# Patient Record
Sex: Female | Born: 1975 | Race: Black or African American | Hispanic: No | Marital: Married | Smoking: Never smoker
Health system: Southern US, Community
[De-identification: ages and names within clinical notes are randomized; demographics above are authoritative.]

## PROBLEM LIST (undated history)

## (undated) DIAGNOSIS — M779 Enthesopathy, unspecified: Secondary | ICD-10-CM

## (undated) DIAGNOSIS — M1990 Unspecified osteoarthritis, unspecified site: Secondary | ICD-10-CM

## (undated) DIAGNOSIS — H04129 Dry eye syndrome of unspecified lacrimal gland: Secondary | ICD-10-CM

## (undated) DIAGNOSIS — Z9109 Other allergy status, other than to drugs and biological substances: Secondary | ICD-10-CM

## (undated) DIAGNOSIS — F410 Panic disorder [episodic paroxysmal anxiety] without agoraphobia: Secondary | ICD-10-CM

## (undated) DIAGNOSIS — K219 Gastro-esophageal reflux disease without esophagitis: Secondary | ICD-10-CM

## (undated) DIAGNOSIS — M719 Bursopathy, unspecified: Secondary | ICD-10-CM

## (undated) DIAGNOSIS — G43909 Migraine, unspecified, not intractable, without status migrainosus: Secondary | ICD-10-CM

## (undated) DIAGNOSIS — IMO0002 Reserved for concepts with insufficient information to code with codable children: Secondary | ICD-10-CM

## (undated) DIAGNOSIS — M329 Systemic lupus erythematosus, unspecified: Secondary | ICD-10-CM

## (undated) DIAGNOSIS — J45909 Unspecified asthma, uncomplicated: Secondary | ICD-10-CM

## (undated) DIAGNOSIS — Z889 Allergy status to unspecified drugs, medicaments and biological substances status: Secondary | ICD-10-CM

## (undated) DIAGNOSIS — K589 Irritable bowel syndrome without diarrhea: Secondary | ICD-10-CM

## (undated) HISTORY — DX: Unspecified osteoarthritis, unspecified site: M19.90

## (undated) HISTORY — PX: BREAST REDUCTION SURGERY: SHX8

## (undated) HISTORY — PX: REDUCTION MAMMAPLASTY: SUR839

## (undated) HISTORY — PX: TUBAL LIGATION: SHX77

---

## 1999-11-13 ENCOUNTER — Emergency Department (HOSPITAL_COMMUNITY): Admission: EM | Admit: 1999-11-13 | Discharge: 1999-11-13 | Payer: Self-pay

## 2002-03-28 ENCOUNTER — Ambulatory Visit (HOSPITAL_COMMUNITY): Admission: RE | Admit: 2002-03-28 | Discharge: 2002-03-28 | Payer: Self-pay | Admitting: Gastroenterology

## 2002-03-28 ENCOUNTER — Encounter: Payer: Self-pay | Admitting: Gastroenterology

## 2002-04-08 ENCOUNTER — Encounter: Payer: Self-pay | Admitting: Gastroenterology

## 2002-04-08 ENCOUNTER — Ambulatory Visit (HOSPITAL_COMMUNITY): Admission: RE | Admit: 2002-04-08 | Discharge: 2002-04-08 | Payer: Self-pay | Admitting: Gastroenterology

## 2002-04-17 ENCOUNTER — Ambulatory Visit (HOSPITAL_COMMUNITY): Admission: RE | Admit: 2002-04-17 | Discharge: 2002-04-17 | Payer: Self-pay | Admitting: Gastroenterology

## 2002-09-09 ENCOUNTER — Emergency Department (HOSPITAL_COMMUNITY): Admission: EM | Admit: 2002-09-09 | Discharge: 2002-09-09 | Payer: Self-pay | Admitting: Emergency Medicine

## 2002-09-09 ENCOUNTER — Encounter: Payer: Self-pay | Admitting: Emergency Medicine

## 2002-11-21 ENCOUNTER — Emergency Department (HOSPITAL_COMMUNITY): Admission: EM | Admit: 2002-11-21 | Discharge: 2002-11-21 | Payer: Self-pay | Admitting: Emergency Medicine

## 2003-05-21 ENCOUNTER — Emergency Department (HOSPITAL_COMMUNITY): Admission: EM | Admit: 2003-05-21 | Discharge: 2003-05-21 | Payer: Self-pay | Admitting: Emergency Medicine

## 2006-06-19 ENCOUNTER — Emergency Department (HOSPITAL_COMMUNITY): Admission: EM | Admit: 2006-06-19 | Discharge: 2006-06-20 | Payer: Self-pay | Admitting: Emergency Medicine

## 2006-06-20 ENCOUNTER — Ambulatory Visit (HOSPITAL_COMMUNITY): Admission: RE | Admit: 2006-06-20 | Discharge: 2006-06-20 | Payer: Self-pay | Admitting: Internal Medicine

## 2015-06-05 ENCOUNTER — Observation Stay (HOSPITAL_COMMUNITY)
Admission: EM | Admit: 2015-06-05 | Discharge: 2015-06-06 | Disposition: A | Discharge status: Home / Self Care | Payer: 59 | Attending: Internal Medicine | Admitting: Internal Medicine

## 2015-06-05 ENCOUNTER — Encounter (HOSPITAL_COMMUNITY): Payer: Self-pay | Admitting: Emergency Medicine

## 2015-06-05 ENCOUNTER — Emergency Department (HOSPITAL_COMMUNITY): Payer: 59

## 2015-06-05 DIAGNOSIS — J069 Acute upper respiratory infection, unspecified: Secondary | ICD-10-CM

## 2015-06-05 DIAGNOSIS — D72829 Elevated white blood cell count, unspecified: Secondary | ICD-10-CM | POA: Insufficient documentation

## 2015-06-05 DIAGNOSIS — R0689 Other abnormalities of breathing: Secondary | ICD-10-CM | POA: Diagnosis not present

## 2015-06-05 DIAGNOSIS — R0600 Dyspnea, unspecified: Secondary | ICD-10-CM | POA: Diagnosis not present

## 2015-06-05 DIAGNOSIS — D473 Essential (hemorrhagic) thrombocythemia: Secondary | ICD-10-CM | POA: Insufficient documentation

## 2015-06-05 DIAGNOSIS — J9601 Acute respiratory failure with hypoxia: Secondary | ICD-10-CM

## 2015-06-05 DIAGNOSIS — M1990 Unspecified osteoarthritis, unspecified site: Secondary | ICD-10-CM | POA: Diagnosis not present

## 2015-06-05 DIAGNOSIS — J309 Allergic rhinitis, unspecified: Secondary | ICD-10-CM | POA: Insufficient documentation

## 2015-06-05 DIAGNOSIS — K589 Irritable bowel syndrome without diarrhea: Secondary | ICD-10-CM | POA: Diagnosis not present

## 2015-06-05 DIAGNOSIS — D509 Iron deficiency anemia, unspecified: Secondary | ICD-10-CM | POA: Diagnosis not present

## 2015-06-05 DIAGNOSIS — H04129 Dry eye syndrome of unspecified lacrimal gland: Secondary | ICD-10-CM | POA: Insufficient documentation

## 2015-06-05 DIAGNOSIS — G43909 Migraine, unspecified, not intractable, without status migrainosus: Secondary | ICD-10-CM | POA: Diagnosis not present

## 2015-06-05 DIAGNOSIS — K219 Gastro-esophageal reflux disease without esophagitis: Secondary | ICD-10-CM | POA: Diagnosis not present

## 2015-06-05 DIAGNOSIS — Z791 Long term (current) use of non-steroidal anti-inflammatories (NSAID): Secondary | ICD-10-CM | POA: Diagnosis not present

## 2015-06-05 DIAGNOSIS — F410 Panic disorder [episodic paroxysmal anxiety] without agoraphobia: Secondary | ICD-10-CM

## 2015-06-05 DIAGNOSIS — J302 Other seasonal allergic rhinitis: Secondary | ICD-10-CM | POA: Diagnosis not present

## 2015-06-05 DIAGNOSIS — Z3202 Encounter for pregnancy test, result negative: Secondary | ICD-10-CM | POA: Insufficient documentation

## 2015-06-05 DIAGNOSIS — R112 Nausea with vomiting, unspecified: Secondary | ICD-10-CM

## 2015-06-05 DIAGNOSIS — J45909 Unspecified asthma, uncomplicated: Secondary | ICD-10-CM | POA: Diagnosis not present

## 2015-06-05 DIAGNOSIS — F419 Anxiety disorder, unspecified: Secondary | ICD-10-CM

## 2015-06-05 DIAGNOSIS — T380X5A Adverse effect of glucocorticoids and synthetic analogues, initial encounter: Secondary | ICD-10-CM | POA: Diagnosis not present

## 2015-06-05 DIAGNOSIS — J4521 Mild intermittent asthma with (acute) exacerbation: Secondary | ICD-10-CM

## 2015-06-05 DIAGNOSIS — J3089 Other allergic rhinitis: Secondary | ICD-10-CM

## 2015-06-05 HISTORY — DX: Irritable bowel syndrome, unspecified: K58.9

## 2015-06-05 HISTORY — DX: Panic disorder (episodic paroxysmal anxiety): F41.0

## 2015-06-05 HISTORY — DX: Gastro-esophageal reflux disease without esophagitis: K21.9

## 2015-06-05 HISTORY — DX: Dry eye syndrome of unspecified lacrimal gland: H04.129

## 2015-06-05 HISTORY — DX: Other allergy status, other than to drugs and biological substances: Z91.09

## 2015-06-05 HISTORY — DX: Enthesopathy, unspecified: M77.9

## 2015-06-05 HISTORY — DX: Bursopathy, unspecified: M71.9

## 2015-06-05 HISTORY — DX: Migraine, unspecified, not intractable, without status migrainosus: G43.909

## 2015-06-05 HISTORY — DX: Allergy status to unspecified drugs, medicaments and biological substances: Z88.9

## 2015-06-05 LAB — COMPREHENSIVE METABOLIC PANEL
ALBUMIN: 3.9 g/dL (ref 3.5–5.0)
ALK PHOS: 59 U/L (ref 38–126)
ALT: 14 U/L (ref 14–54)
ANION GAP: 9 (ref 5–15)
AST: 23 U/L (ref 15–41)
BILIRUBIN TOTAL: 0.7 mg/dL (ref 0.3–1.2)
BUN: 7 mg/dL (ref 6–20)
CALCIUM: 9.2 mg/dL (ref 8.9–10.3)
CO2: 21 mmol/L — AB (ref 22–32)
CREATININE: 0.82 mg/dL (ref 0.44–1.00)
Chloride: 107 mmol/L (ref 101–111)
GFR calc Af Amer: >60 mL/min (ref >60)
GFR calc non Af Amer: >60 mL/min (ref >60)
GLUCOSE: 105 mg/dL — AB (ref 65–99)
Potassium: 3.4 mmol/L — ABNORMAL LOW (ref 3.5–5.1)
SODIUM: 137 mmol/L (ref 135–145)
TOTAL PROTEIN: 7.3 g/dL (ref 6.5–8.1)

## 2015-06-05 LAB — CBC WITH DIFFERENTIAL/PLATELET
BASOS PCT: 0 % (ref 0–1)
Basophils Absolute: 0 10*3/uL (ref 0.0–0.1)
Eosinophils Absolute: 0.4 10*3/uL (ref 0.0–0.7)
Eosinophils Relative: 3 % (ref 0–5)
HEMATOCRIT: 37.6 % (ref 36.0–46.0)
HEMOGLOBIN: 12.5 g/dL (ref 12.0–15.0)
LYMPHS ABS: 1.9 10*3/uL (ref 0.7–4.0)
Lymphocytes Relative: 18 % (ref 12–46)
MCH: 28.2 pg (ref 26.0–34.0)
MCHC: 33.2 g/dL (ref 30.0–36.0)
MCV: 84.7 fL (ref 78.0–100.0)
MONOS PCT: 6 % (ref 3–12)
Monocytes Absolute: 0.6 10*3/uL (ref 0.1–1.0)
NEUTROS ABS: 7.4 10*3/uL (ref 1.7–7.7)
NEUTROS PCT: 73 % (ref 43–77)
Platelets: 430 10*3/uL — ABNORMAL HIGH (ref 150–400)
RBC: 4.44 MIL/uL (ref 3.87–5.11)
RDW: 12.7 % (ref 11.5–15.5)
WBC: 10.2 10*3/uL (ref 4.0–10.5)

## 2015-06-05 LAB — TROPONIN I: Troponin I: <0.03 ng/mL (ref <0.031)

## 2015-06-05 LAB — POC URINE PREG, ED: Preg Test, Ur: NEGATIVE

## 2015-06-05 LAB — I-STAT TROPONIN, ED
TROPONIN I, POC: 0 ng/mL (ref 0.00–0.08)
Troponin i, poc: 0 ng/mL (ref 0.00–0.08)

## 2015-06-05 MED ORDER — ACETAMINOPHEN 325 MG PO TABS: 650.0000 mg | ORAL_TABLET | Freq: Four times a day (QID) | ORAL | Status: DC | PRN

## 2015-06-05 MED ORDER — PREDNISONE 20 MG PO TABS
60.0000 mg | ORAL_TABLET | Freq: Once | ORAL | Status: AC
  Administered 2015-06-05: 60 mg via ORAL
  Filled 2015-06-05: qty 3

## 2015-06-05 MED ORDER — IPRATROPIUM BROMIDE 0.02 % IN SOLN
0.5000 mg | Freq: Four times a day (QID) | RESPIRATORY_TRACT | Status: DC
  Administered 2015-06-05 – 2015-06-06 (×3): 0.5 mg via RESPIRATORY_TRACT
  Filled 2015-06-05 (×3): qty 2.5

## 2015-06-05 MED ORDER — PANTOPRAZOLE SODIUM 40 MG PO TBEC
40.0000 mg | DELAYED_RELEASE_TABLET | Freq: Two times a day (BID) | ORAL | Status: DC
  Administered 2015-06-06 (×2): 40 mg via ORAL
  Filled 2015-06-05 (×2): qty 1

## 2015-06-05 MED ORDER — LORAZEPAM 2 MG/ML IJ SOLN
1.0000 mg | Freq: Once | INTRAMUSCULAR | Status: AC
  Administered 2015-06-05: 1 mg via INTRAVENOUS
  Filled 2015-06-05: qty 1

## 2015-06-05 MED ORDER — DIAZEPAM 5 MG/ML IJ SOLN
5.0000 mg | Freq: Once | INTRAMUSCULAR | Status: AC
  Administered 2015-06-05: 5 mg via INTRAVENOUS
  Filled 2015-06-05: qty 2

## 2015-06-05 MED ORDER — ONDANSETRON HCL 4 MG PO TABS: 4.0000 mg | ORAL_TABLET | Freq: Four times a day (QID) | ORAL | Status: DC | PRN

## 2015-06-05 MED ORDER — SODIUM CHLORIDE 0.9 % IJ SOLN
3.0000 mL | Freq: Two times a day (BID) | INTRAMUSCULAR | Status: DC
  Administered 2015-06-05 – 2015-06-06 (×2): 3 mL via INTRAVENOUS

## 2015-06-05 MED ORDER — METHYLPREDNISOLONE SODIUM SUCC 40 MG IJ SOLR
40.0000 mg | Freq: Two times a day (BID) | INTRAMUSCULAR | Status: DC
  Administered 2015-06-05 – 2015-06-06 (×2): 40 mg via INTRAVENOUS
  Filled 2015-06-05 (×2): qty 1

## 2015-06-05 MED ORDER — LIDOCAINE HCL (PF) 2 % IJ SOLN: 5.0000 mL | Freq: Once | INTRAMUSCULAR | Status: DC

## 2015-06-05 MED ORDER — FLUTICASONE PROPIONATE 50 MCG/ACT NA SUSP
1.0000 | Freq: Every day | NASAL | Status: DC
  Administered 2015-06-05 – 2015-06-06 (×2): 1 via NASAL
  Filled 2015-06-05: qty 16

## 2015-06-05 MED ORDER — IPRATROPIUM-ALBUTEROL 0.5-2.5 (3) MG/3ML IN SOLN
3.0000 mL | Freq: Once | RESPIRATORY_TRACT | Status: AC
  Administered 2015-06-05: 3 mL via RESPIRATORY_TRACT
  Filled 2015-06-05: qty 3

## 2015-06-05 MED ORDER — ENOXAPARIN SODIUM 40 MG/0.4ML ~~LOC~~ SOLN
40.0000 mg | SUBCUTANEOUS | Status: DC
  Administered 2015-06-05: 40 mg via SUBCUTANEOUS
  Filled 2015-06-05: qty 0.4

## 2015-06-05 MED ORDER — ONDANSETRON HCL 4 MG/2ML IJ SOLN: 4.0000 mg | Freq: Four times a day (QID) | INTRAMUSCULAR | Status: DC | PRN

## 2015-06-05 MED ORDER — MAGNESIUM SULFATE 2 GM/50ML IV SOLN
2.0000 g | Freq: Once | INTRAVENOUS | Status: AC
  Administered 2015-06-05: 2 g via INTRAVENOUS
  Filled 2015-06-05: qty 50

## 2015-06-05 MED ORDER — DIAZEPAM 5 MG PO TABS: 5.0000 mg | ORAL_TABLET | Freq: Four times a day (QID) | ORAL | Status: DC | PRN

## 2015-06-05 MED ORDER — LIDOCAINE HCL (PF) 2 % IJ SOLN
2.0000 mL | Freq: Once | INTRAMUSCULAR | Status: DC
  Filled 2015-06-05: qty 2

## 2015-06-05 MED ORDER — ALBUTEROL SULFATE (2.5 MG/3ML) 0.083% IN NEBU
2.5000 mg | INHALATION_SOLUTION | RESPIRATORY_TRACT | Status: AC
  Administered 2015-06-05: 2.5 mg via RESPIRATORY_TRACT
  Filled 2015-06-05: qty 3

## 2015-06-05 MED ORDER — IBUPROFEN 400 MG PO TABS: 800.0000 mg | ORAL_TABLET | Freq: Four times a day (QID) | ORAL | Status: DC | PRN

## 2015-06-05 MED ORDER — ACETAMINOPHEN 650 MG RE SUPP: 650.0000 mg | Freq: Four times a day (QID) | RECTAL | Status: DC | PRN

## 2015-06-05 MED ORDER — ALBUTEROL SULFATE (2.5 MG/3ML) 0.083% IN NEBU: 2.5000 mg | INHALATION_SOLUTION | RESPIRATORY_TRACT | Status: DC | PRN

## 2015-06-05 MED ORDER — MONTELUKAST SODIUM 10 MG PO TABS
10.0000 mg | ORAL_TABLET | Freq: Every day | ORAL | Status: DC
  Administered 2015-06-05: 10 mg via ORAL
  Filled 2015-06-05: qty 1

## 2015-06-05 NOTE — ED Notes (Signed)
Vital signs stable. 

## 2015-06-05 NOTE — ED Notes (Signed)
Family at bedside. 

## 2015-06-05 NOTE — Consult Note (Signed)
Reason for Consult: Shortness of breath Referring Physician: Emergency room  Jasmine Schultz is an 39 y.o. female.  HPI: History of shortness of breath and possible stridor. She was seen in urgent care and had respiratory distress there and was treated with multiple products including epinephrine and steroid. She was sent to the emergency room. Once at the emergency room she had a soft tissue neck which was normal and had no specific reason for her shortness of breath. She was noted to have expiratory wheezing. She has not had this problem previously. She does have a history of reflux problems. She has not ingested anything. No foreign body issues that she is aware of. I was asked to see her to make sure her upper airway is without evidence of any angioedema.  History reviewed. No pertinent past medical history.  History reviewed. No pertinent past surgical history.  History reviewed. No pertinent family history.  Social History:  reports that she has never smoked. She does not have any smokeless tobacco history on file. She reports that she does not drink alcohol or use illicit drugs.  Allergies:  Allergies  Allergen Reactions  . Tomato Anaphylaxis and Rash  . Oysters [Shellfish Allergy] Hives    Medications: I have reviewed the patient's current medications.  Results for orders placed or performed during the hospital encounter of 06/05/15 (from the past 48 hour(s))  CBC with Differential     Status: Abnormal   Collection Time: 06/05/15 12:37 PM  Result Value Ref Range   WBC 10.2 4.0 - 10.5 K/uL   RBC 4.44 3.87 - 5.11 MIL/uL   Hemoglobin 12.5 12.0 - 15.0 g/dL   HCT 37.6 36.0 - 46.0 %   MCV 84.7 78.0 - 100.0 fL   MCH 28.2 26.0 - 34.0 pg   MCHC 33.2 30.0 - 36.0 g/dL   RDW 12.7 11.5 - 15.5 %   Platelets 430 (H) 150 - 400 K/uL   Neutrophils Relative % 73 43 - 77 %   Neutro Abs 7.4 1.7 - 7.7 K/uL   Lymphocytes Relative 18 12 - 46 %   Lymphs Abs 1.9 0.7 - 4.0 K/uL   Monocytes  Relative 6 3 - 12 %   Monocytes Absolute 0.6 0.1 - 1.0 K/uL   Eosinophils Relative 3 0 - 5 %   Eosinophils Absolute 0.4 0.0 - 0.7 K/uL   Basophils Relative 0 0 - 1 %   Basophils Absolute 0.0 0.0 - 0.1 K/uL  Comprehensive metabolic panel     Status: Abnormal   Collection Time: 06/05/15 12:37 PM  Result Value Ref Range   Sodium 137 135 - 145 mmol/L   Potassium 3.4 (L) 3.5 - 5.1 mmol/L   Chloride 107 101 - 111 mmol/L   CO2 21 (L) 22 - 32 mmol/L   Glucose, Bld 105 (H) 65 - 99 mg/dL   BUN 7 6 - 20 mg/dL   Creatinine, Ser 0.82 0.44 - 1.00 mg/dL   Calcium 9.2 8.9 - 10.3 mg/dL   Total Protein 7.3 6.5 - 8.1 g/dL   Albumin 3.9 3.5 - 5.0 g/dL   AST 23 15 - 41 U/L   ALT 14 14 - 54 U/L   Alkaline Phosphatase 59 38 - 126 U/L   Total Bilirubin 0.7 0.3 - 1.2 mg/dL   GFR calc non Af Amer >60 >60 mL/min   GFR calc Af Amer >60 >60 mL/min    Comment: (NOTE) The eGFR has been calculated using the CKD EPI  equation. This calculation has not been validated in all clinical situations. eGFR's persistently <60 mL/min signify possible Chronic Kidney Disease.    Anion gap 9 5 - 15  I-Stat Troponin, ED - 0, 3, 6 hours (not at [facility])     Status: None   Collection Time: 06/05/15 12:37 PM  Result Value Ref Range   Troponin i, poc 0.00 0.00 - 0.08 ng/mL   Comment 3            Comment: Due to the release kinetics of cTnI, a negative result within the first hours of the onset of symptoms does not rule out myocardial infarction with certainty. If myocardial infarction is still suspected, repeat the test at appropriate intervals.   I-Stat Troponin, ED - 0, 3, 6 hours (not at [facility])     Status: None   Collection Time: 06/05/15  3:37 PM  Result Value Ref Range   Troponin i, poc 0.00 0.00 - 0.08 ng/mL   Comment 3            Comment: Due to the release kinetics of cTnI, a negative result within the first hours of the onset of symptoms does not rule out myocardial infarction with certainty. If myocardial  infarction is still suspected, repeat the test at appropriate intervals.     Dg Neck Soft Tissue  06/05/2015   CLINICAL DATA:  39 year old female with acute respiratory distress and cough. Feeling of airway closing.  EXAM: NECK SOFT TISSUES - 1+ VIEW  COMPARISON:  None.  FINDINGS: There is no evidence of retropharyngeal soft tissue swelling or epiglottic enlargement. The adenoidal tissue is slightly prominent. The cervical airway is unremarkable and no unexpected radio-opaque foreign body identified.  IMPRESSION: Slightly prominent adenoids otherwise unremarkable exam.   Electronically Signed   By: Margarette Canada M.D.   On: 06/05/2015 12:56   Dg Chest Portable 1 View  06/05/2015   CLINICAL DATA:  Respiratory distress at the doctor's office, continuous coughing  EXAM: PORTABLE CHEST - 1 VIEW  COMPARISON:  None.  FINDINGS: Study is hypoinspiratory with low lung volumes, with resultant crowding of the perihilar and bibasilar bronchovascular markings. Given the low lung volumes, cardiomediastinal silhouette is within normal limits in size/configuration and lungs are clear. No confluent opacity to suggest a developing pneumonia. No pleural effusions seen. No pneumothorax. No osseous abnormality.  IMPRESSION: Hypoinspiratory exam with low lung volumes.  No evidence of acute cardiopulmonary abnormality.   Electronically Signed   By: Franki Cabot M.D.   On: 06/05/2015 12:53    ROS Blood pressure 129/82, pulse 95, temperature 99.2 F (37.3 C), temperature source Oral, resp. rate 18, SpO2 100 %. Physical Exam  Constitutional: She appears well-developed and well-nourished.  HENT:  Head: Normocephalic and atraumatic.  She seems uncomfortable. She has a intermittent expiratory stridor wheezing sound. Her voice sounds normal. She does seem short of breath. Fiberoptic exam reveals the nasopharynx, pharynx, hypopharynx are all without evidence of any significant swelling. The vocal cords are slightly thickened and  there is a pseudo-sulcus. This is consistent with reflux. There seems to be good airway with open vocal cord sputum while she was having the expiratory wheezing meaning its down in her bronchus or lungs. Old focal cords appeared to move normally. She was straining and did tense the vocal cords multiple times while the exam was being performed but they never closed completely except one instructed to do so.  Neck: Normal range of motion. Neck supple.    Assessment/Plan:  Shortness of breath-fiberoptic exam does not reveal any evidence of upper airway swelling. It is possible that she was having some laryngospasm problems which would most likely be related to reflux. Currently there is no explanation for her expiratory wheezing that she was having while observing the vocal cords. It would appear that there is some inflammation or problem in the bronchus or lungs. I would recommend a critical care consult with pulmonology. Reflux treatment regardless should be instituted with twice a day proton pump inhibitor  Melissa Montane 06/05/2015, 4:04 PM

## 2015-06-05 NOTE — ED Notes (Signed)
ENT at bedside

## 2015-06-05 NOTE — ED Notes (Signed)
Report called to 5W RN

## 2015-06-05 NOTE — ED Notes (Signed)
NRB  Removed from pt , sats remain 100 %

## 2015-06-05 NOTE — H&P (Signed)
[facility] History and Physical  Jasmine Schultz ZOX:[id number] DOB: 20-Dec-1975 DOA: 06/05/2015  Referring physician:  Gareth Morgan PCP:  Maximino Greenland, MD   Chief Complaint:  SOB  HPI:  The patient is a 39 y.o. year-old female with history of sun allergy, allergic rhinitis, severe contact dermatitis to numerous products, bursitis, tendinitis, migraine headaches, IBS, GERD, and chronic dry eye, anxiety attacks who presents with shortness of breath.  The patient was last at their baseline health until yesterday. She developed some mild sore throat and nausea and had some vomiting. After she vomited, she felt like her throat was closing a little bit, but it got better.  She vomited several additional times this morning, after which she felt very Lukka Black of breath and like her throat was closing up. She developed wheezing, sensation of tongue swelling, difficulty swallowing.  + orthopnea and ankle edema. She denied chest pains initially, but then endorsed substernal pressure. She also had lightheadedness, shooting pains across the front of her chest that went down to both of her hands with tingling in her fingers.  She denied fevers, chills, diarrhea, dysuria. She denied any new food exposures, or contact with any of her known allergens. She denied hives, itching, skin rash of any kind.  She presented to urgent care where she was in respiratory distress and given epinephrine, Benadryl, Decadron, Atrovent, albuterol. EMS was called and she was placed on CPAP and was transported over to the emergency department. In the emergency department, her oxygen saturations were 99-100% on room air. She had x-ray soft tissue neck which demonstrated no evidence of epiglottitis but slightly prominent adenoids.  Chest x-ray demonstrated hypoventilatory exam with low lung volumes but no evidence of acute cardiopulmonary abnormality.  ENT was consulted and they performed bedside fiberoptic exam which revealed no  evidence of upper airway swelling. He suggested that she was having laryngospasm secondary to reflux or recent vomiting. They found no explanation for her expiratory wheezing and suggested she had a problem of the bronchi or lungs and critical care pulmonology consult. Pulmonology was consulted and recommended ABG and treatment for presumptive asthma. She is being admitted for further treatment. Of note, when the respiratory therapist came to perform her ABG she suddenly became extremely tearful and anxious and had shaking that prevented even an attempt at obtaining a blood draw. She had previously been given Ativan 1 mg IV which made no improvement in her symptoms and she was given duo nab and 1 dose of Valium in the emergency department. She also started prednisone.  Review of Systems:  General:  Denies fevers, chills, weight loss or gain HEENT:  Denies changes to hearing and vision, rhinorrhea, sinus congestion, positive sore throat CV:  Positive chest pain and denies palpitations, positive lower extremity edema.  PULM:  Positive SOB, wheezing, cough.   GI:  Positive nausea, nonbilious, nonbloody vomiting, constipation, diarrhea.   GU:  Denies dysuria, frequency, urgency ENDO:  Denies polyuria, polydipsia.   HEME:  Denies hematemesis, blood in stools, melena, abnormal bruising or bleeding.  LYMPH:  Denies lymphadenopathy.   MSK:  Denies arthralgias, myalgias.   DERM:  Denies skin rash or ulcer.   NEURO:  Denies focal numbness, weakness, slurred speech, confusion, facial droop.  PSYCH:  Denies anxiety and depression.    Past Medical History  Diagnosis Date  . Multiple allergies   . Sun allergy   . Bursitis   . Tendonitis   . Migraine   . IBS (irritable bowel syndrome)   .  GERD (gastroesophageal reflux disease)   . Chronically dry eyes   . Anxiety attack    Past Surgical History  Procedure Laterality Date  . Tubal ligation    . Breast reduction surgery     Social History:  reports  that she has never smoked. She does not have any smokeless tobacco history on file. She reports that she drinks alcohol. She reports that she does not use illicit drugs.  Allergies  Allergen Reactions  . Tomato Anaphylaxis and Rash  . Oysters [Shellfish Allergy] Hives    Family History  Problem Relation Age of Onset  . Asthma    . CAD    . CVA    . Aneurysm      brain aneurysm multiple family members     Prior to Admission medications   Medication Sig Start Date End Date Taking? Authorizing Provider  Bilberry, Vaccinium myrtillus, (BILBERRY PO) Take 1 tablet by mouth daily.   Yes Historical Provider, MD  BIOTIN PO Take 1 tablet by mouth daily.   Yes Historical Provider, MD  Cholecalciferol (VITAMIN D-3 PO) Take 1 capsule by mouth daily.   Yes Historical Provider, MD  CINNAMON PO Take 1 tablet by mouth daily.   Yes Historical Provider, MD  ECHINACEA PO Take 1 tablet by mouth daily.   Yes Historical Provider, MD  EVENING PRIMROSE OIL PO Take 1 tablet by mouth daily.   Yes Historical Provider, MD  ferrous sulfate 325 (65 FE) MG tablet Take 325 mg by mouth daily with breakfast.   Yes Historical Provider, MD  folic acid (FOLVITE) 1 MG tablet Take 1 mg by mouth daily.   Yes Historical Provider, MD  milk thistle 175 MG tablet Take 175 mg by mouth daily.   Yes Historical Provider, MD  Omega-3 Fatty Acids (FISH OIL PO) Take 1 capsule by mouth daily.   Yes Historical Provider, MD  Prenatal Vit-Fe Fumarate-FA (PRENATAL MULTIVITAMIN) TABS tablet Take 1 tablet by mouth daily at 12 noon.   Yes Historical Provider, MD  Red Yeast Rice Extract (RED YEAST RICE PO) Take 1 tablet by mouth daily.   Yes Historical Provider, MD  selenium 50 MCG TABS tablet Take 50 mcg by mouth daily.   Yes Historical Provider, MD   Physical Exam: Filed Vitals:   06/05/15 1530 06/05/15 1600 06/05/15 1630 06/05/15 1715  BP: 129/82 137/87 115/67 108/70  Pulse: 95 89 94 95  Temp:      TempSrc:      Resp: 18 16 33 19   SpO2: 100% 100% 100% 100%     General:  Obese female, tachypneic but in NAD  Eyes:  PERRL, anicteric, non-injected.  ENT:  Nares clear.  OP clear, mild erythema of the tonsils with cobblestoning, no exudates or plaques.  MMM.  Neck:  Supple without TM or JVD.    Lymph:  No cervical, supraclavicular, or submandibular LAD.  Cardiovascular:  RRR, normal S1, S2, without m/r/g.  2+ pulses, warm extremities  Respiratory:  Full expiratory wheeze throughout without focal rales, rhonchi, no stridor  Abdomen:  NABS.  Soft, ND/NT.    Skin:  No rashes or focal lesions.  Musculoskeletal:  Normal bulk and tone.  Trace nonpitting bilateral LE edema.  Psychiatric:  A & O x 4.  Anxious affect.  Neurologic:  CN 3-12 intact.  5/5 strength.  Sensation intact.  Labs on Admission:  Basic Metabolic Panel:  Recent Labs Lab 06/05/15 1237  NA 137  K 3.4*  CL  107  CO2 21*  GLUCOSE 105*  BUN 7  CREATININE 0.82  CALCIUM 9.2   Liver Function Tests:  Recent Labs Lab 06/05/15 1237  AST 23  ALT 14  ALKPHOS 59  BILITOT 0.7  PROT 7.3  ALBUMIN 3.9   No results for input(s): LIPASE, AMYLASE in the last 168 hours. No results for input(s): AMMONIA in the last 168 hours. CBC:  Recent Labs Lab 06/05/15 1237  WBC 10.2  NEUTROABS 7.4  HGB 12.5  HCT 37.6  MCV 84.7  PLT 430*   Cardiac Enzymes: No results for input(s): CKTOTAL, CKMB, CKMBINDEX, TROPONINI in the last 168 hours.  BNP (last 3 results) No results for input(s): BNP in the last 8760 hours.  ProBNP (last 3 results) No results for input(s): PROBNP in the last 8760 hours.  CBG: No results for input(s): GLUCAP in the last 168 hours.  Radiological Exams on Admission: Dg Neck Soft Tissue  06/05/2015   CLINICAL DATA:  39 year old female with acute respiratory distress and cough. Feeling of airway closing.  EXAM: NECK SOFT TISSUES - 1+ VIEW  COMPARISON:  None.  FINDINGS: There is no evidence of retropharyngeal soft tissue  swelling or epiglottic enlargement. The adenoidal tissue is slightly prominent. The cervical airway is unremarkable and no unexpected radio-opaque foreign body identified.  IMPRESSION: Slightly prominent adenoids otherwise unremarkable exam.   Electronically Signed   By: Margarette Canada M.D.   On: 06/05/2015 12:56   Dg Chest Portable 1 View  06/05/2015   CLINICAL DATA:  Respiratory distress at the doctor's office, continuous coughing  EXAM: PORTABLE CHEST - 1 VIEW  COMPARISON:  None.  FINDINGS: Study is hypoinspiratory with low lung volumes, with resultant crowding of the perihilar and bibasilar bronchovascular markings. Given the low lung volumes, cardiomediastinal silhouette is within normal limits in size/configuration and lungs are clear. No confluent opacity to suggest a developing pneumonia. No pleural effusions seen. No pneumothorax. No osseous abnormality.  IMPRESSION: Hypoinspiratory exam with low lung volumes.  No evidence of acute cardiopulmonary abnormality.   Electronically Signed   By: Franki Cabot M.D.   On: 06/05/2015 12:53    EKG: Independently reviewed. Normal sinus rhythm, no previous EKG for comparison but has flattened T waves in the lateral leads.  Assessment/Plan Active Problems:   Reactive airway disease   Dyspnea   Nausea and vomiting   Anxiety attack   Allergic rhinitis  ---  Dyspnea without acute hypoxic respiratory failure, likely secondary to reactive airway disease from exposure from an allergen in the setting of underlying anxiety.  Alternatively, she may have some bronchospasm from an aspiration event from her vomiting.  Chest pain improving. -  ABG not able to be obtained -  Respiratory viral panel -  Telemetry -  Cycle one more troponin -  Agree with steroids -  Nebulizer treatments per pulmonology -  Agree with Singulair and magnesium -  Protonix  Allergic rhinitis, chronic, stable, continue Flonase  Anxiety with panic attacks -  Consider starting  SSRI -  Will continue valium prn  Nausea and vomiting likely due to viral illness.  No abdominal pain to suggest pancreatitis -  LFTs wnl -  zofran prn -  Pregnancy test negative  Migraine headache, currently HA free.  Ibuprofen prn headache  IBS, stable  Takes multiple supplements for her health:  Bilberry, biotin, cinnamon, echinacea, evening primrose, milk thistle, omega-3, red yeast rice extract, selenium, and PNV (had tubal ligation many years ago)  Diet:  regular Access:  PIV IVF:  off Proph:  lovenox  Code Status: full Family Communication: patient, her mother and bo Disposition Plan: Admit to telemetry  Time spent: 60 min Janece Canterbury [facility] Pager [phone]  If 7PM-7AM, please contact night-coverage [email or web address] Password [facility] 06/05/2015, 6:27 PM

## 2015-06-05 NOTE — ED Notes (Signed)
Patient is resting comfortably. 

## 2015-06-05 NOTE — ED Notes (Signed)
Pt able to walk but continues to state that she feels  sob but sats remain 100 % , pt resp rate does increase

## 2015-06-05 NOTE — Progress Notes (Signed)
Pt arrived in room. Pt states no complaints. A&Ox4. Family at bedside. Telemetry applied.

## 2015-06-05 NOTE — Consult Note (Addendum)
Name: Jasmine Schultz MRN: [id number] DOB: 01-06-1976    ADMISSION DATE:  06/05/2015 CONSULTATION DATE:  06/05/15  REFERRING MD :  ED  CHIEF COMPLAINT:  Shortness of breath  BRIEF PATIENT DESCRIPTION: Jasmine Schultz is a 39 year old with history of allergic rhinitis, postnasal drip, seasonal allergies, GERD.   SIGNIFICANT EVENTS   STUDIES:  CXR (06/05/15) No acute abnormality  Neck X ray (06/05/15) Slightly prominent adenoids otherwise unremarkable exam.  HISTORY OF PRESENT ILLNESS:   Presents with 1 day of sore throat and cough associated with wheezing and dyspnea. She went to urgent care where she was given Decadron 10 mg IM. She was also given epi, albuterol and Atrovent nebs and sent to ED. She reports that she felt her throat closing after she was given the nebulizers. She was put on CPAP briefly. Noted to have stridor and upper eyelids and diffuse bilateral wheezes. She was seen by ENT for a question of vocal cord dysfunction. Their examination revealed no evidence of VCD, airway edema or obstruction. 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   PAST MEDICAL HISTORY :  GERD Seasonal allergies Allergic rhinitis Postnasal drip Bursitis IBS Arthritis Migraines   Prior to Admission medications   Medication Sig Start Date End Date Taking? Authorizing Jasmine Schultz  Bilberry, Vaccinium myrtillus, (BILBERRY PO) Take 1 tablet by mouth daily.   Yes Historical Jasmine Bakke, MD  BIOTIN PO Take 1 tablet by mouth daily.   Yes Historical Jasmine Mizzell, MD  Cholecalciferol (VITAMIN D-3 PO) Take 1 capsule by mouth daily.   Yes Historical Jasmine Hirota, MD  CINNAMON PO Take 1 tablet by mouth daily.   Yes Historical Jasmine Warshawsky, MD  ECHINACEA PO Take 1 tablet by mouth daily.   Yes Historical Jasmine Kopke, MD  EVENING PRIMROSE OIL PO Take 1 tablet by mouth daily.   Yes Historical Jasmine Uplinger, MD  ferrous sulfate 325 (65 FE) MG tablet Take 325 mg by mouth daily with breakfast.   Yes Historical Jasmine Dungee, MD    folic acid (FOLVITE) 1 MG tablet Take 1 mg by mouth daily.   Yes Historical Jasmine Nanez, MD  milk thistle 175 MG tablet Take 175 mg by mouth daily.   Yes Historical Jasmine Buch, MD  Omega-3 Fatty Acids (FISH OIL PO) Take 1 capsule by mouth daily.   Yes Historical Jasmine Gatchalian, MD  Prenatal Vit-Fe Fumarate-FA (PRENATAL MULTIVITAMIN) TABS tablet Take 1 tablet by mouth daily at 12 noon.   Yes Historical Jasmine Oliveri, MD  Red Yeast Rice Extract (RED YEAST RICE PO) Take 1 tablet by mouth daily.   Yes Historical Jasmine Zani, MD  selenium 50 MCG TABS tablet Take 50 mcg by mouth daily.   Yes Historical Jasmine Mangino, MD   Allergies  Allergen Reactions  . Tomato Anaphylaxis and Rash  . Oysters [employer] Allergy] Hives    FAMILY HISTORY:  family history is not on file. SOCIAL HISTORY:  reports that she has never smoked. She does not have any smokeless tobacco history on file. She reports that she does not drink alcohol or use illicit drugs.  REVIEW OF SYSTEMS:   Constitutional: Negative for fever, chills, weight loss, malaise/fatigue and diaphoresis.  HENT: Negative for hearing loss, ear pain, nosebleeds, congestion, sore throat, neck pain, tinnitus and ear discharge.   Eyes: Negative for blurred vision, double vision, photophobia, pain, discharge and redness.  Respiratory: Positive for cough, shortness of breath, wheezing and stridor. Negative for hemoptysis, sputum production,    Cardiovascular: Negative for chest pain, palpitations, orthopnea, claudication, leg swelling and PND.  Gastrointestinal: Negative for heartburn, nausea, vomiting, abdominal pain, diarrhea, constipation, blood in stool and melena.  Genitourinary: Negative for dysuria, urgency, frequency, hematuria and flank pain.  Musculoskeletal: Negative for myalgias, back pain, joint pain and falls.  Skin: Negative for itching and rash.  Neurological: Negative for dizziness, tingling, tremors, sensory change, speech change, focal weakness, seizures, loss  of consciousness, weakness and headaches.  All other ROS are negative.  SUBJECTIVE:   VITAL SIGNS: Temp:  [99.2 F (37.3 C)] 99.2 F (37.3 C) (08/26 1207) Pulse Rate:  [87-107] 95 (08/26 1530) Resp:  [10-22] 18 (08/26 1530) BP: (105-138)/(58-98) 129/82 mmHg (08/26 1530) SpO2:  [99 %-100 %] 100 % (08/26 1530)  PHYSICAL EXAMINATION: General: Mild distress, Awake, oriented Neuro:  Intact, no gross focal deficits HEENT:  PERRLA, moist mucus membranes, no stridor Cardiovascular:  RRR, no MRG Lungs: Diffuse b/l exp wheeze. Abdomen:  Soft, + BS Skin: Intact   Recent Labs Lab 06/05/15 1237  NA 137  K 3.4*  CL 107  CO2 21*  BUN 7  CREATININE 0.82  GLUCOSE 105*    Recent Labs Lab 06/05/15 1237  HGB 12.5  HCT 37.6  WBC 10.2  PLT 430*   Dg Neck Soft Tissue  06/05/2015   CLINICAL DATA:  39 year old female with acute respiratory distress and cough. Feeling of airway closing.  EXAM: NECK SOFT TISSUES - 1+ VIEW  COMPARISON:  None.  FINDINGS: There is no evidence of retropharyngeal soft tissue swelling or epiglottic enlargement. The adenoidal tissue is slightly prominent. The cervical airway is unremarkable and no unexpected radio-opaque foreign body identified.  IMPRESSION: Slightly prominent adenoids otherwise unremarkable exam.   Electronically Signed   By: Margarette Canada M.D.   On: 06/05/2015 12:56   Dg Chest Portable 1 View  06/05/2015   CLINICAL DATA:  Respiratory distress at the doctor's office, continuous coughing  EXAM: PORTABLE CHEST - 1 VIEW  COMPARISON:  None.  FINDINGS: Study is hypoinspiratory with low lung volumes, with resultant crowding of the perihilar and bibasilar bronchovascular markings. Given the low lung volumes, cardiomediastinal silhouette is within normal limits in size/configuration and lungs are clear. No confluent opacity to suggest a developing pneumonia. No pleural effusions seen. No pneumothorax. No osseous abnormality.  IMPRESSION: Hypoinspiratory exam  with low lung volumes.  No evidence of acute cardiopulmonary abnormality.   Electronically Signed   By: Franki Cabot M.D.   On: 06/05/2015 12:53    ASSESSMENT / PLAN: Mrs. Mchaney likely has reactive airway disease/asthma. Her issues with allergic rhinitis, sinusitis, postnasal drip and GERD are likely contributing to her problems. The etiology of the stridor is not entirely clear. There is no evidence of vocal cord dysfunction or airway obstruction by ENT examination. She could be having intermittent vocal cord spasms from laryngeal pharyngeal reflux. I suspect that anxiety is a component as well.  She appears comfortable on room air with sats of 100%, RR of 20. No accessory muscle use. We were asked about the need for bronchoscopy. I do not believe this is indicated in this case.   Our recommendations are - Check ABG, viral swab - Albuterol nebs X 3 every 20 mins and then q2 hrs PRN - Atrovent nebs qid - IV Magnesium 2gm once - Start IV solumedrol 40 mg bid - Flonase nasal spray - Protonix 40 mg bid - Singulair 10 mg daily  Marshell Garfinkel MD [facility] Pager [phone] If no answer or after 3pm call: [phone] 06/05/2015,  5:25 PM

## 2015-06-05 NOTE — ED Notes (Signed)
Pt here via EMS from MD office were she went into resp distress at the MD office , pr received epi, benadryl , decadron and atrovent, ems gave one albuterol , pt was placed on cpap , but taken off before arriving to ED

## 2015-06-05 NOTE — ED Notes (Signed)
Updated  Pt and family on plan of admission

## 2015-06-05 NOTE — ED Notes (Signed)
X-ray at bedside

## 2015-06-05 NOTE — ED Provider Notes (Signed)
CSN: [id number]     Arrival date & time 06/05/15  1158 History   First MD Initiated Contact with Patient 06/05/15 1222     Chief Complaint  Patient presents with  . Shortness of Breath     (Consider location/radiation/quality/duration/timing/severity/associated sxs/prior Treatment) Patient is a 39 y.o. female presenting with shortness of breath.  Shortness of Breath Severity:  Severe Onset quality:  Gradual Duration:  6 hours Timing:  Constant Progression:  Worsening (slightly improved after treatment sbut worsenign prior to this) Chronicity:  New Context: URI   Relieved by: epi, decadron, nebs. Worsened by:  Nothing tried Ineffective treatments:  None tried Associated symptoms: cough, neck pain, sore throat and vomiting (last night)   Associated symptoms: no abdominal pain, no chest pain, no fever, no headaches, no rash and no syncope   Risk factors: no hx of PE/DVT, no prolonged immobilization, no recent surgery and no tobacco use     Past Medical History  Diagnosis Date  . Multiple allergies   . Sun allergy   . Bursitis   . Tendonitis   . Migraine   . IBS (irritable bowel syndrome)   . GERD (gastroesophageal reflux disease)   . Chronically dry eyes   . Anxiety attack    Past Surgical History  Procedure Laterality Date  . Tubal ligation    . Breast reduction surgery     Family History  Problem Relation Age of Onset  . Asthma    . CAD    . CVA    . Aneurysm      brain aneurysm multiple family members   Social History  Substance Use Topics  . Smoking status: Never Smoker   . Smokeless tobacco: None  . Alcohol Use: 0.0 oz/week    0 Standard drinks or equivalent per week     Comment: occasional   OB History    No data available     Review of Systems  Constitutional: Negative for fever.  HENT: Positive for sore throat.   Eyes: Negative for visual disturbance.  Respiratory: Positive for cough and shortness of breath.   Cardiovascular: Negative for  chest pain and syncope.  Gastrointestinal: Positive for vomiting (last night). Negative for abdominal pain.  Genitourinary: Negative for difficulty urinating.  Musculoskeletal: Positive for neck pain. Negative for back pain.  Skin: Negative for rash.  Neurological: Negative for syncope and headaches.      Allergies  Tomato and Oysters  Home Medications   Prior to Admission medications   Medication Sig Start Date End Date Taking? Authorizing Provider  Bilberry, Vaccinium myrtillus, (BILBERRY PO) Take 1 tablet by mouth daily.   Yes Historical Provider, MD  BIOTIN PO Take 1 tablet by mouth daily.   Yes Historical Provider, MD  Cholecalciferol (VITAMIN D-3 PO) Take 1 capsule by mouth daily.   Yes Historical Provider, MD  CINNAMON PO Take 1 tablet by mouth daily.   Yes Historical Provider, MD  ECHINACEA PO Take 1 tablet by mouth daily.   Yes Historical Provider, MD  EVENING PRIMROSE OIL PO Take 1 tablet by mouth daily.   Yes Historical Provider, MD  ferrous sulfate 325 (65 FE) MG tablet Take 325 mg by mouth daily with breakfast.   Yes Historical Provider, MD  fluticasone (FLONASE) 50 MCG/ACT nasal spray Place 1 spray into both nostrils daily.   Yes Historical Provider, MD  folic acid (FOLVITE) 1 MG tablet Take 1 mg by mouth daily.   Yes Historical Provider, MD  milk  thistle 175 MG tablet Take 175 mg by mouth daily.   Yes Historical Provider, MD  Omega-3 Fatty Acids (FISH OIL PO) Take 1 capsule by mouth daily.   Yes Historical Provider, MD  Prenatal Vit-Fe Fumarate-FA (PRENATAL MULTIVITAMIN) TABS tablet Take 1 tablet by mouth daily at 12 noon.   Yes Historical Provider, MD  Red Yeast Rice Extract (RED YEAST RICE PO) Take 1 tablet by mouth daily.   Yes Historical Provider, MD  selenium 50 MCG TABS tablet Take 50 mcg by mouth daily.   Yes Historical Provider, MD  albuterol (PROVENTIL HFA;VENTOLIN HFA) 108 (90 BASE) MCG/ACT inhaler Inhale 2 puffs into the lungs every 6 (six) hours as needed for  wheezing or shortness of breath. 06/06/15   Janece Canterbury, MD  mometasone-formoterol [facility]) 100-5 MCG/ACT AERO Inhale 2 puffs into the lungs 2 (two) times daily. 06/06/15   Janece Canterbury, MD  montelukast (SINGULAIR) 10 MG tablet Take 1 tablet (10 mg total) by mouth at bedtime. 06/06/15   Janece Canterbury, MD  ondansetron (ZOFRAN) 4 MG tablet Take 1 tablet (4 mg total) by mouth every 8 (eight) hours as needed for nausea or vomiting. 06/06/15   Janece Canterbury, MD  pantoprazole (PROTONIX) 40 MG tablet Take 1 tablet (40 mg total) by mouth 2 (two) times daily before a meal. 06/06/15   Janece Canterbury, MD  predniSONE (DELTASONE) 20 MG tablet Take 2 tablets (40 mg total) by mouth daily with breakfast. 06/06/15   Janece Canterbury, MD   BP 134/73 mmHg  Pulse 92  Temp(Src) 98.6 F (37 C) (Oral)  Resp 19  Ht 5\' 5"  (1.651 m)  Wt 296 lb 11.8 oz (134.6 kg)  BMI 49.38 kg/m2  SpO2 98% Physical Exam  Constitutional: She is oriented to person, place, and time. She appears well-developed and well-nourished. She appears distressed (anxious). Face mask in place.  HENT:  Head: Normocephalic and atraumatic.  Mouth/Throat: No lacerations. No oropharyngeal exudate.  Eyes: Conjunctivae and EOM are normal. Pupils are equal, round, and reactive to light.  Neck: Normal range of motion. Tracheal tenderness present.  No stridor Exp high pitched wheeze heard over trachea   Cardiovascular: Normal rate, regular rhythm, normal heart sounds and intact distal pulses.  Exam reveals no gallop and no friction rub.   No murmur heard. Pulmonary/Chest: Effort normal and breath sounds normal. No stridor. No respiratory distress. She has no wheezes (no wheezing in chest (initial eval)). She has no rales.  Abdominal: Soft. She exhibits no distension. There is no tenderness. There is no guarding.  Musculoskeletal: She exhibits no edema or tenderness.  Neurological: She is alert and oriented to person, place, and time.  Skin: Skin  is warm and dry. No rash noted. She is not diaphoretic. No erythema.  Nursing note and vitals reviewed.   ED Course  Procedures (including critical care time) Labs Review Labs Reviewed  CBC WITH DIFFERENTIAL/PLATELET - Abnormal; Notable for the following:    Platelets 430 (*)    All other components within normal limits  COMPREHENSIVE METABOLIC PANEL - Abnormal; Notable for the following:    Potassium 3.4 (*)    CO2 21 (*)    Glucose, Bld 105 (*)    All other components within normal limits  BASIC METABOLIC PANEL - Abnormal; Notable for the following:    CO2 19 (*)    Glucose, Bld 117 (*)    All other components within normal limits  CBC - Abnormal; Notable for the following:  WBC 13.6 (*)    Hemoglobin 11.8 (*)    HCT 35.2 (*)    Platelets 446 (*)    All other components within normal limits  RESPIRATORY VIRUS PANEL  TROPONIN I  I-STAT TROPOININ, ED  I-STAT TROPOININ, ED  POC URINE PREG, ED  Randolm Idol, ED    Imaging Review Dg Neck Soft Tissue  06/05/2015   CLINICAL DATA:  39 year old female with acute respiratory distress and cough. Feeling of airway closing.  EXAM: NECK SOFT TISSUES - 1+ VIEW  COMPARISON:  None.  FINDINGS: There is no evidence of retropharyngeal soft tissue swelling or epiglottic enlargement. The adenoidal tissue is slightly prominent. The cervical airway is unremarkable and no unexpected radio-opaque foreign body identified.  IMPRESSION: Slightly prominent adenoids otherwise unremarkable exam.   Electronically Signed   By: Margarette Canada M.D.   On: 06/05/2015 12:56   Dg Chest Portable 1 View  06/05/2015   CLINICAL DATA:  Respiratory distress at the doctor's office, continuous coughing  EXAM: PORTABLE CHEST - 1 VIEW  COMPARISON:  None.  FINDINGS: Study is hypoinspiratory with low lung volumes, with resultant crowding of the perihilar and bibasilar bronchovascular markings. Given the low lung volumes, cardiomediastinal silhouette is within normal limits  in size/configuration and lungs are clear. No confluent opacity to suggest a developing pneumonia. No pleural effusions seen. No pneumothorax. No osseous abnormality.  IMPRESSION: Hypoinspiratory exam with low lung volumes.  No evidence of acute cardiopulmonary abnormality.   Electronically Signed   By: Franki Cabot M.D.   On: 06/05/2015 12:53   I have personally reviewed and evaluated these images and lab results as part of my medical decision-making.   EKG Interpretation   Date/Time:  Friday June 05 2015 12:09:46 EDT Ventricular Rate:  99 PR Interval:  155 QRS Duration: 87 QT Interval:  348 QTC Calculation: 447 R Axis:   66 Text Interpretation:  Sinus tachycardia Ventricular premature complex  Probable left atrial enlargement Borderline repolarization abnormality  Baseline wander in lead(s) I II aVR aVL V2 ED PHYSICIAN INTERPRETATION  AVAILABLE IN [facility] Confirmed by TEST, Record ([id number]) on  06/06/2015 10:15:54 AM      MDM   Final diagnoses:  None   39 year old female who denies any medical history my examination presents with concern of dyspnea. Patient reports nausea vomiting and sore throat starting yesterday and last night with shortness of breath developing this morning. She went to urgent care where she was evaluated and felt her throat was closing and had respiratory distress for which they gave her epinephrine, Decadron and EMS was called and gave her nebulized albuterol and placed her on CPAP. CPAP was removed prior to patient's arrival to the emergency department.   On my initial examination patient is anxious, not phonating, reporting a sensation of throat closing, however with no stridor or wheezing in the chest on exam. She was initially given Ativan and x-rays of the soft tissue neck and chest were ordered.  The neck x-ray showed prominent adenoids and otherwise was within normal limits. Chest x-ray was also within normal limits.  Low suspicion for  epiglottitis, retropharyngeal abscess. Anaphylaxis on the differential although history and exposures are not classic. Patient may have other laryngospasm. Given her persistent symptoms after Ativan, continuing sensation of difficulty breathing, inability to fully phonate, ENT was consulted to evaluate for other cause of symptoms (angioedema) .  ENT was consulted and came to ED, scoped patient and did not see any signs of airway edema,  and recommended pulmonology consult to evaluate for distal pathology.  Pulmonology was consulted and came to ED. On my reevaluation patient was noted to have diffuse wheezing, which had not been present on earlier presentation.  She was given do not exam and 60 of prednisone. Given patient's continuing significant feeling of anxiety, shortness of breath concern of throat closing, patient will be admitted for observation for reactive airway disease with continued wheezing for nebulized treatments and observation.    Gareth Morgan, MD 06/06/15 [phone]

## 2015-06-05 NOTE — Progress Notes (Signed)
Received report from ED.  

## 2015-06-06 DIAGNOSIS — D509 Iron deficiency anemia, unspecified: Secondary | ICD-10-CM | POA: Diagnosis not present

## 2015-06-06 DIAGNOSIS — R112 Nausea with vomiting, unspecified: Secondary | ICD-10-CM | POA: Diagnosis not present

## 2015-06-06 DIAGNOSIS — J4521 Mild intermittent asthma with (acute) exacerbation: Secondary | ICD-10-CM | POA: Diagnosis not present

## 2015-06-06 DIAGNOSIS — J45909 Unspecified asthma, uncomplicated: Secondary | ICD-10-CM | POA: Diagnosis not present

## 2015-06-06 DIAGNOSIS — J9601 Acute respiratory failure with hypoxia: Secondary | ICD-10-CM | POA: Diagnosis not present

## 2015-06-06 DIAGNOSIS — J302 Other seasonal allergic rhinitis: Secondary | ICD-10-CM | POA: Diagnosis not present

## 2015-06-06 DIAGNOSIS — F419 Anxiety disorder, unspecified: Secondary | ICD-10-CM | POA: Diagnosis not present

## 2015-06-06 DIAGNOSIS — H04129 Dry eye syndrome of unspecified lacrimal gland: Secondary | ICD-10-CM | POA: Diagnosis not present

## 2015-06-06 LAB — CBC
HCT: 35.2 % — ABNORMAL LOW (ref 36.0–46.0)
HEMOGLOBIN: 11.8 g/dL — AB (ref 12.0–15.0)
MCH: 28.2 pg (ref 26.0–34.0)
MCHC: 33.5 g/dL (ref 30.0–36.0)
MCV: 84 fL (ref 78.0–100.0)
PLATELETS: 446 10*3/uL — AB (ref 150–400)
RBC: 4.19 MIL/uL (ref 3.87–5.11)
RDW: 13 % (ref 11.5–15.5)
WBC: 13.6 10*3/uL — AB (ref 4.0–10.5)

## 2015-06-06 LAB — BASIC METABOLIC PANEL
ANION GAP: 10 (ref 5–15)
BUN: 9 mg/dL (ref 6–20)
CALCIUM: 9.3 mg/dL (ref 8.9–10.3)
CO2: 19 mmol/L — AB (ref 22–32)
CREATININE: 0.79 mg/dL (ref 0.44–1.00)
Chloride: 106 mmol/L (ref 101–111)
Glucose, Bld: 117 mg/dL — ABNORMAL HIGH (ref 65–99)
Potassium: 4.8 mmol/L (ref 3.5–5.1)
SODIUM: 135 mmol/L (ref 135–145)

## 2015-06-06 MED ORDER — MENTHOL 3 MG MT LOZG
1.0000 | LOZENGE | OROMUCOSAL | Status: DC | PRN
  Administered 2015-06-06: 3 mg via ORAL
  Filled 2015-06-06: qty 9

## 2015-06-06 MED ORDER — MOMETASONE FURO-FORMOTEROL FUM 100-5 MCG/ACT IN AERO: 2.0000 | INHALATION_SPRAY | Freq: Two times a day (BID) | RESPIRATORY_TRACT | Status: DC

## 2015-06-06 MED ORDER — PANTOPRAZOLE SODIUM 40 MG PO TBEC: 40.0000 mg | DELAYED_RELEASE_TABLET | Freq: Two times a day (BID) | ORAL | Status: DC

## 2015-06-06 MED ORDER — ALBUTEROL SULFATE HFA 108 (90 BASE) MCG/ACT IN AERS: 2.0000 | INHALATION_SPRAY | Freq: Four times a day (QID) | RESPIRATORY_TRACT | Status: DC | PRN

## 2015-06-06 MED ORDER — PREDNISONE 20 MG PO TABS: 40.0000 mg | ORAL_TABLET | Freq: Every day | ORAL | Status: DC

## 2015-06-06 MED ORDER — MONTELUKAST SODIUM 10 MG PO TABS: 10.0000 mg | ORAL_TABLET | Freq: Every day | ORAL | Status: DC

## 2015-06-06 MED ORDER — ONDANSETRON HCL 4 MG PO TABS: 4.0000 mg | ORAL_TABLET | Freq: Three times a day (TID) | ORAL | Status: DC | PRN

## 2015-06-06 NOTE — Discharge Summary (Signed)
Physician Discharge Summary  Jasmine Schultz YCX:[id number] DOB: 12-29-1975 DOA: 06/05/2015  PCP: Jasmine Greenland, MD  Admit date: 06/05/2015 Discharge date: 06/06/2015  Recommendations for Outpatient Follow-up:  1. F/u with pulmonology for new diagnosis of reactive airway disease and PFT if felt indicated 2. Ongoing f/u with allergist 3. PCP to continue to address anxiety component of symptoms 4. F/u respiratory viral panel  Discharge Diagnoses:  Principal Problem:   Reactive airway disease Active Problems:   Dyspnea   Nausea and vomiting   Anxiety attack   Allergic rhinitis   Acute respiratory failure with hypoxia   Anemia, iron deficiency   Discharge Condition: stable, improved  Diet recommendation: healthy heart  Wt Readings from Last 3 Encounters:  06/06/15 134.6 kg (296 lb 11.8 oz)    History of present illness:   The patient is a 39 y.o. year-old female with history of sun allergy, allergic rhinitis, severe contact dermatitis to numerous products, bursitis, tendinitis, migraine headaches, IBS, GERD, chronic dry eye, and anxiety attacks who presented with shortness of breath. The day prior to admission, she developed nausea, vomiting, and mild SOB which worsened on the morning of admission.  She felt very Jasmine Schultz of breath and like her throat was closing up. She developed wheezing, orthopnea, sensation of tongue swelling, difficulty swallowing.  She reported some ankle edema for the last few weeks.  She endorsed substernal pressure with radiation across her chest and down both arms with lightheadedness.  She denied fevers, chills, diarrhea, dysuria. She denied any new food exposures, or contact with any of her known allergens. She denied hives, itching, skin rash of any kind.  She presented to urgent care where she was in respiratory distress and given epinephrine, Benadryl, Decadron, Atrovent, albuterol. EMS was called and she was placed on CPAP and was transported over to the  emergency department. In the emergency department, her oxygen saturations were 99-100% on room air. She had x-ray soft tissue neck which demonstrated no evidence of epiglottitis but slightly prominent adenoids. Chest x-ray demonstrated hypoventilatory exam with low lung volumes but no evidence of acute cardiopulmonary abnormality. ENT was consulted and they performed bedside fiberoptic exam which revealed no evidence of upper airway swelling. He suggested that she was having laryngospasm secondary to reflux or recent vomiting.  Pulmonology was consulted and recommended ABG and treatment for presumptive asthma. She was being admitted for further treatment.  She was too anxious to undergo ABG and did not have much relief with ativan or valium.    Hospital Course:   Dyspnea without acute hypoxic respiratory failure, likely secondary to reactive airway disease from probable URI. Alternatively, she may have some bronchospasm from an aspiration event from her vomiting. She had minimal nonpitting edema, no evidence of pulmonary edema on CXR.  ECG had mildly flattened T-waves in the lateral leads during period of tachycardia and were upright on repeat during NSR sigh normal rate.  Troponins were negative.  Telemetry demonstrated NSR with mild intermittent sinus tachycardia.  Her chest tightness improved with nebulizer treatments.  ABG was not able to be obtained secondary to anxiety.  Her dyspnea and wheezing also improved with prednisone and duonebs.  She was started on singulair and magnesium and protonix.  She was able to ambulate, sit up to eat, and go to the bathroom without dyspnea at the time.    Allergic rhinitis, chronic, stable, continued Flonase  Anxiety with panic attacks, recommend she talk to her primary care doctor about starting SSRI  Nausea and vomiting likely due to viral illness. No abdominal pain to suggest pancreatitis.  LFTs wnl and pregnancy test negative.  Zofran prn.  Migraine  headache, currently HA free. Ibuprofen prn headache  IBS, stable  Mild leukocytosis and thrombocytosis secondary to steroids.  PCP to repeat in 1-2 weeks.  Normocytic anemia likely due to iron deficiency, continued iron and ongoing monitoring by PCP  Takes multiple supplements for her health: Bilberry, biotin, cinnamon, echinacea, evening primrose, milk thistle, omega-3, red yeast rice extract, selenium, and PNV (had tubal ligation many years ago)  Procedures:  XR soft tissue neck  CXR  Consultations:  Pulmonology  Discharge Exam: Filed Vitals:   06/06/15 1431  BP: 134/73  Pulse: 92  Temp: 98.6 F (37 C)  Resp: 19   Filed Vitals:   06/06/15 0640 06/06/15 0826 06/06/15 0836 06/06/15 1431  BP: 124/70 125/68  134/73  Pulse: 89 87  92  Temp: 99.3 F (37.4 C) 99 F (37.2 C)  98.6 F (37 C)  TempSrc: Oral Oral  Oral  Resp:    19  Height:  5\' 5"  (1.651 m)    Weight:  134.6 kg (296 lb 11.8 oz)    SpO2: 98% 98% 97% 98%    General: adult female, NAD Cardiovascular: RRR, no mrg Respiratory: Diminished with mild wheeze throughout, no rales or rhonchi ABD:  NABS, soft, ND/NT MSK:  No LEE, normal tone and bulk  Discharge Instructions      Discharge Instructions    Call MD for:  difficulty breathing, headache or visual disturbances    Complete by:  As directed      Call MD for:  extreme fatigue    Complete by:  As directed      Call MD for:  hives    Complete by:  As directed      Call MD for:  persistant dizziness or light-headedness    Complete by:  As directed      Call MD for:  persistant nausea and vomiting    Complete by:  As directed      Call MD for:  severe uncontrolled pain    Complete by:  As directed      Call MD for:  temperature >100.4    Complete by:  As directed      Diet - low sodium heart healthy    Complete by:  As directed      Discharge instructions    Complete by:  As directed   You were hospitalized with shortness of breath probably  from some reactive airway disease which is the precursor to asthma.  Please take prednisone for the next few days.  Inhaled medications are expensive and you may be able to get some assistance from community health and wellness clinic.  Albuterol can be used as needed for shortness of breath but dulera should be used every day to prevent wheezing.  Please also take protonix and singulair to help with allergies, sore throat, and wheezing.  I have included the contact information for the pulmonologists in your discharge paperwork.  Please call on Monday to schedule an appointment with them for within 2 weeks.  If you have worsening shortness of breath, please return to the hospital right away.     Increase activity slowly    Complete by:  As directed             Medication List    TAKE these medications  albuterol 108 (90 BASE) MCG/ACT inhaler  Commonly known as:  PROVENTIL HFA;VENTOLIN HFA  Inhale 2 puffs into the lungs every 6 (six) hours as needed for wheezing or shortness of breath.     BILBERRY PO  Take 1 tablet by mouth daily.     BIOTIN PO  Take 1 tablet by mouth daily.     CINNAMON PO  Take 1 tablet by mouth daily.     ECHINACEA PO  Take 1 tablet by mouth daily.     EVENING PRIMROSE OIL PO  Take 1 tablet by mouth daily.     ferrous sulfate 325 (65 FE) MG tablet  Take 325 mg by mouth daily with breakfast.     FISH OIL PO  Take 1 capsule by mouth daily.     fluticasone 50 MCG/ACT nasal spray  Commonly known as:  FLONASE  Place 1 spray into both nostrils daily.     folic acid 1 MG tablet  Commonly known as:  FOLVITE  Take 1 mg by mouth daily.     milk thistle 175 MG tablet  Take 175 mg by mouth daily.     mometasone-formoterol 100-5 MCG/ACT Aero  Commonly known as:  DULERA  Inhale 2 puffs into the lungs 2 (two) times daily.     montelukast 10 MG tablet  Commonly known as:  SINGULAIR  Take 1 tablet (10 mg total) by mouth at bedtime.     pantoprazole 40  MG tablet  Commonly known as:  PROTONIX  Take 1 tablet (40 mg total) by mouth 2 (two) times daily before a meal.     predniSONE 20 MG tablet  Commonly known as:  DELTASONE  Take 2 tablets (40 mg total) by mouth daily with breakfast.     prenatal multivitamin Tabs tablet  Take 1 tablet by mouth daily at 12 noon.     RED YEAST RICE PO  Take 1 tablet by mouth daily.     selenium 50 MCG Tabs tablet  Take 50 mcg by mouth daily.     VITAMIN D-3 PO  Take 1 capsule by mouth daily.       Follow-up Information    Follow up with Jasmine Greenland, MD. Schedule an appointment as soon as possible for a visit in 2 weeks.   Specialty:  Internal Medicine   Contact information:   [address] [place] [address] [phone]       Follow up with [facility]. Schedule an appointment as soon as possible for a visit in 1 month.   Specialty:  Pulmonology   Contact information:   [place] [phone]       The results of significant diagnostics from this hospitalization (including imaging, microbiology, ancillary and laboratory) are listed below for reference.    Significant Diagnostic Studies: Dg Neck Soft Tissue  06/05/2015   CLINICAL DATA:  39 year old female with acute respiratory distress and cough. Feeling of airway closing.  EXAM: NECK SOFT TISSUES - 1+ VIEW  COMPARISON:  None.  FINDINGS: There is no evidence of retropharyngeal soft tissue swelling or epiglottic enlargement. The adenoidal tissue is slightly prominent. The cervical airway is unremarkable and no unexpected radio-opaque foreign body identified.  IMPRESSION: Slightly prominent adenoids otherwise unremarkable exam.   Electronically Signed   By: Margarette Canada M.D.   On: 06/05/2015 12:56   Dg Chest Portable 1 View  06/05/2015   CLINICAL DATA:  Respiratory distress at  the doctor's office, continuous coughing  EXAM: PORTABLE CHEST - 1 VIEW  COMPARISON:  None.   FINDINGS: Study is hypoinspiratory with low lung volumes, with resultant crowding of the perihilar and bibasilar bronchovascular markings. Given the low lung volumes, cardiomediastinal silhouette is within normal limits in size/configuration and lungs are clear. No confluent opacity to suggest a developing pneumonia. No pleural effusions seen. No pneumothorax. No osseous abnormality.  IMPRESSION: Hypoinspiratory exam with low lung volumes.  No evidence of acute cardiopulmonary abnormality.   Electronically Signed   By: Franki Cabot M.D.   On: 06/05/2015 12:53    Microbiology: No results found for this or any previous visit (from the past 240 hour(s)).   Labs: Basic Metabolic Panel:  Recent Labs Lab 06/05/15 1237 06/06/15 0528  NA 137 135  K 3.4* 4.8  CL 107 106  CO2 21* 19*  GLUCOSE 105* 117*  BUN 7 9  CREATININE 0.82 0.79  CALCIUM 9.2 9.3   Liver Function Tests:  Recent Labs Lab 06/05/15 1237  AST 23  ALT 14  ALKPHOS 59  BILITOT 0.7  PROT 7.3  ALBUMIN 3.9   No results for input(s): LIPASE, AMYLASE in the last 168 hours. No results for input(s): AMMONIA in the last 168 hours. CBC:  Recent Labs Lab 06/05/15 1237 06/06/15 0528  WBC 10.2 13.6*  NEUTROABS 7.4  --   HGB 12.5 11.8*  HCT 37.6 35.2*  MCV 84.7 84.0  PLT 430* 446*   Cardiac Enzymes:  Recent Labs Lab 06/05/15 2051  TROPONINI <0.03   BNP: BNP (last 3 results) No results for input(s): BNP in the last 8760 hours.  ProBNP (last 3 results) No results for input(s): PROBNP in the last 8760 hours.  CBG: No results for input(s): GLUCAP in the last 168 hours.  Time coordinating discharge: 35 minutes  Signed:  Talayia Hjort  [facility] 06/06/2015, 4:46 PM

## 2015-06-06 NOTE — Progress Notes (Signed)
   06/06/2015   To Whom It May Concern,  Jasmine Schultz was admitted to [facility]. [facility] from 06/05/2015 to 06/06/2015.  She may return to work without restrictions on 9/5.    Sincerely,    Janece Canterbury, MD [facility] [address] [place]  [address]  Ph:    [phone] Fax:  [phone]

## 2015-06-06 NOTE — Progress Notes (Signed)
06/06/2015 Pt being discharged home. IV site removed, discharged instructions reviewed with pt. Husband will be picking pt up. Transferring in wheelchair to exit.

## 2015-06-08 LAB — RESPIRATORY VIRUS PANEL
Adenovirus: NEGATIVE
INFLUENZA A: NEGATIVE
INFLUENZA B 1: NEGATIVE
Metapneumovirus: NEGATIVE
PARAINFLUENZA 1 A: NEGATIVE
PARAINFLUENZA 2 A: NEGATIVE
PARAINFLUENZA 3 A: NEGATIVE
RESPIRATORY SYNCYTIAL VIRUS B: NEGATIVE
RHINOVIRUS: POSITIVE — AB
Respiratory Syncytial Virus A: NEGATIVE

## 2015-06-29 ENCOUNTER — Ambulatory Visit (INDEPENDENT_AMBULATORY_CARE_PROVIDER_SITE_OTHER): Payer: 59 | Admitting: Pulmonary Disease

## 2015-06-29 ENCOUNTER — Encounter: Payer: Self-pay | Admitting: Pulmonary Disease

## 2015-06-29 VITALS — BP 128/86 | HR 84 | Temp 98.4°F | Ht 65.5 in | Wt 292.8 lb

## 2015-06-29 DIAGNOSIS — J4521 Mild intermittent asthma with (acute) exacerbation: Secondary | ICD-10-CM

## 2015-06-29 DIAGNOSIS — R0600 Dyspnea, unspecified: Secondary | ICD-10-CM

## 2015-06-29 NOTE — Patient Instructions (Signed)
Pulmonary function tests Return in 4 months

## 2015-06-29 NOTE — Progress Notes (Signed)
Subjective:    Patient ID: Jasmine Schultz, female    DOB: Jun 07, 1976, 39 y.o.   MRN: [id number]  HPI Jasmine Schultz is a 39 year old with history of allergic rhinitis, postnasal drip, seasonal allergies, GERD.   She was admitted last month for cough, wheezing, dyspnea. She presented with 1 day of sore throat and cough associated with wheezing and dyspnea. She went to urgent care where she was given Decadron 10 mg IM. She was also given epi, albuterol and Atrovent nebs and sent to ED. She reports that she felt her throat closing after she was given the nebulizers. She was put on CPAP briefly. Noted to have stridor and upper eyelids and diffuse bilateral wheezes. She was seen by ENT for a question of vocal cord dysfunction. Their examination revealed no evidence of VCD, airway edema or obstruction. 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 She was discharged in 1 day with albuterol inhalers, prednisone taper.  On return to clinic today she states that she feels much better with improvement in her symptoms. She is still using her albuterol inhaler but needs it once a week or less. Her primary care doctor has also prescribed her Dulera inhaler but she has not yet filled the prescription. She is exposed to a lot of secondhand cigarette smoke at her workplace. She is trying to avoid her exposure to smoke.   Past Medical History  Diagnosis Date  . Multiple allergies   . Sun allergy   . Bursitis   . Tendonitis   . Migraine   . IBS (irritable bowel syndrome)   . GERD (gastroesophageal reflux disease)   . Chronically dry eyes   . Anxiety attack     Current outpatient prescriptions:  .  albuterol (PROVENTIL HFA;VENTOLIN HFA) 108 (90 BASE) MCG/ACT inhaler, Inhale 2 puffs into the lungs every 6 (six) hours as needed for wheezing or shortness of breath., Disp: 1 Inhaler, Rfl: 0 .  Bilberry, Vaccinium myrtillus, (BILBERRY PO), Take 1 tablet by mouth daily., Disp: , Rfl:  .  BIOTIN PO, Take 1  tablet by mouth daily., Disp: , Rfl:  .  Cholecalciferol (VITAMIN D-3 PO), Take 1 capsule by mouth daily., Disp: , Rfl:  .  CINNAMON PO, Take 1 tablet by mouth daily., Disp: , Rfl:  .  ECHINACEA PO, Take 1 tablet by mouth daily., Disp: , Rfl:  .  EVENING PRIMROSE OIL PO, Take 1 tablet by mouth daily., Disp: , Rfl:  .  ferrous sulfate 325 (65 FE) MG tablet, Take 325 mg by mouth daily with breakfast., Disp: , Rfl:  .  fluticasone (FLONASE) 50 MCG/ACT nasal spray, Place 1 spray into both nostrils at bedtime as needed. , Disp: , Rfl:  .  folic acid (FOLVITE) 1 MG tablet, Take 1 mg by mouth daily., Disp: , Rfl:  .  milk thistle 175 MG tablet, Take 175 mg by mouth daily., Disp: , Rfl:  .  montelukast (SINGULAIR) 10 MG tablet, Take 1 tablet (10 mg total) by mouth at bedtime., Disp: 30 tablet, Rfl: 0 .  Omega-3 Fatty Acids (FISH OIL PO), Take 1 capsule by mouth daily., Disp: , Rfl:  .  ondansetron (ZOFRAN) 4 MG tablet, Take 1 tablet (4 mg total) by mouth every 8 (eight) hours as needed for nausea or vomiting., Disp: 20 tablet, Rfl: 0 .  pantoprazole (PROTONIX) 40 MG tablet, Take 1 tablet (40 mg total) by mouth 2 (two) times daily before a meal., Disp: 60 tablet, Rfl:  0 .  Prenatal Vit-Fe Fumarate-FA (PRENATAL MULTIVITAMIN) TABS tablet, Take 1 tablet by mouth daily at 12 noon., Disp: , Rfl:  .  Red Yeast Rice Extract (RED YEAST RICE PO), Take 1 tablet by mouth daily., Disp: , Rfl:  .  selenium 50 MCG TABS tablet, Take 50 mcg by mouth daily., Disp: , Rfl:  .  mometasone-formoterol (DULERA) 100-5 MCG/ACT AERO, Inhale 2 puffs into the lungs 2 (two) times daily. (Patient not taking: Reported on 06/29/2015), Disp: 13 g, Rfl: 0   Review of Systems  Constitutional: Negative for fever and unexpected weight change.  HENT: Positive for congestion, ear pain, postnasal drip, sinus pressure, sore throat and trouble swallowing. Negative for dental problem, nosebleeds, rhinorrhea and sneezing.   Eyes: Negative for  redness and itching.  Respiratory: Positive for chest tightness, shortness of breath and wheezing. Negative for cough.   Cardiovascular: Positive for leg swelling. Negative for palpitations.  Gastrointestinal: Negative for nausea and vomiting.  Genitourinary: Negative for dysuria.  Musculoskeletal: Positive for joint swelling.  Skin: Positive for rash.  Neurological: Positive for headaches.  Hematological: Does not bruise/bleed easily.  Psychiatric/Behavioral: Negative for dysphoric mood. The patient is nervous/anxious.        Objective:   Physical Exam  Constitutional: She is oriented to person, place, and time. She appears well-developed and well-nourished.  HENT:  Mouth/Throat: Oropharynx is clear and moist.  Eyes: Conjunctivae are normal. Pupils are equal, round, and reactive to light.  Neck: Normal range of motion. Neck supple.  Cardiovascular: Normal rate, regular rhythm and normal heart sounds.   Pulmonary/Chest: Effort normal and breath sounds normal.  Abdominal: Soft. Bowel sounds are normal.  Musculoskeletal: Normal range of motion.  Neurological: She is alert and oriented to person, place, and time.  Skin: Skin is warm and dry.      Assessment & Plan:  Jasmine Schultz likely has reactive airway disease/asthma. Her issues with allergic rhinitis, sinusitis, postnasal drip and GERD are likely contributing to her problems. The etiology of the stridor is not entirely clear. There is no evidence of vocal cord dysfunction or airway obstruction by ENT examination. She could be having intermittent vocal cord spasms from laryngeal pharyngeal reflux. I suspect that anxiety is a component as well.  Plan: - Continue Protonix 40 mg Bid for GERD - Allergy meds as before- Flonase, Singulair. - Albuterol and Dulera for asthma - Full PFTs with BD response.  RTC in 3-4 months.  Marshell Garfinkel MD [facility] Pager [phone] If no answer or after 3pm call:  [phone] 06/29/2015, 2:39 PM

## 2015-07-30 ENCOUNTER — Ambulatory Visit (INDEPENDENT_AMBULATORY_CARE_PROVIDER_SITE_OTHER): Payer: 59 | Admitting: Pulmonary Disease

## 2015-07-30 DIAGNOSIS — R0600 Dyspnea, unspecified: Secondary | ICD-10-CM | POA: Diagnosis not present

## 2015-07-30 LAB — PULMONARY FUNCTION TEST
DL/VA % PRED: 120 %
DL/VA: 5.96 ml/min/mmHg/L
DLCO UNC: 23.48 ml/min/mmHg
DLCO unc % pred: 90 %
FEF 25-75 POST: 2.15 L/s
FEF 25-75 Pre: 2.26 L/sec
FEF2575-%Change-Post: -4 %
FEF2575-%PRED-POST: 74 %
FEF2575-%PRED-PRE: 78 %
FEV1-%Change-Post: 0 %
FEV1-%PRED-PRE: 87 %
FEV1-%Pred-Post: 87 %
FEV1-POST: 2.3 L
FEV1-PRE: 2.31 L
FEV1FVC-%CHANGE-POST: 1 %
FEV1FVC-%PRED-PRE: 97 %
FEV6-%CHANGE-POST: -2 %
FEV6-%PRED-PRE: 91 %
FEV6-%Pred-Post: 89 %
FEV6-Post: 2.79 L
FEV6-Pre: 2.86 L
FEV6FVC-%Pred-Post: 102 %
FEV6FVC-%Pred-Pre: 102 %
FVC-%Change-Post: -2 %
FVC-%PRED-PRE: 89 %
FVC-%Pred-Post: 87 %
FVC-POST: 2.79 L
FVC-PRE: 2.86 L
POST FEV1/FVC RATIO: 82 %
POST FEV6/FVC RATIO: 100 %
PRE FEV1/FVC RATIO: 81 %
Pre FEV6/FVC Ratio: 100 %

## 2015-07-30 NOTE — Progress Notes (Signed)
PFT done today. 

## 2015-09-18 ENCOUNTER — Telehealth: Payer: Self-pay | Admitting: Pulmonary Disease

## 2015-09-18 NOTE — Telephone Encounter (Signed)
Relayed results to pt.  She is unaware that she has asthma, and has asked that I send this back to clarify.  PM please advise if pt truly does have asthma, as she has never been diagnosed and this is new to her.  Thanks!

## 2015-09-18 NOTE — Telephone Encounter (Signed)
Spoke with pt, requesting recs from PFT on 07/30/2015.    PM please advise.  Thanks!

## 2015-09-18 NOTE — Telephone Encounter (Signed)
Please let her know that the PFTs does not show that the asthma is affecting her lung function.

## 2015-09-22 NOTE — Telephone Encounter (Signed)
PM, please advise.

## 2015-09-23 NOTE — Telephone Encounter (Signed)
I called twice and got the voice mail. I asked her to call back if she has any more questions about her PFTs

## 2015-10-06 ENCOUNTER — Other Ambulatory Visit: Payer: Self-pay

## 2015-10-06 DIAGNOSIS — Z1231 Encounter for screening mammogram for malignant neoplasm of breast: Secondary | ICD-10-CM

## 2015-10-23 ENCOUNTER — Ambulatory Visit
Admission: RE | Admit: 2015-10-23 | Discharge: 2015-10-23 | Disposition: A | Discharge status: Home / Self Care | Payer: 59 | Source: Ambulatory Visit

## 2015-10-23 DIAGNOSIS — Z1231 Encounter for screening mammogram for malignant neoplasm of breast: Secondary | ICD-10-CM

## 2015-12-04 ENCOUNTER — Other Ambulatory Visit: Payer: 59

## 2015-12-04 ENCOUNTER — Ambulatory Visit (INDEPENDENT_AMBULATORY_CARE_PROVIDER_SITE_OTHER): Payer: 59 | Admitting: Pulmonary Disease

## 2015-12-04 ENCOUNTER — Encounter: Payer: Self-pay | Admitting: Pulmonary Disease

## 2015-12-04 VITALS — BP 112/78 | HR 83 | Ht 65.5 in | Wt 291.4 lb

## 2015-12-04 DIAGNOSIS — R0600 Dyspnea, unspecified: Secondary | ICD-10-CM

## 2015-12-04 DIAGNOSIS — J302 Other seasonal allergic rhinitis: Secondary | ICD-10-CM

## 2015-12-04 NOTE — Progress Notes (Signed)
Subjective:    Patient ID: Jasmine Schultz, female    DOB: December 28, 1975, 40 y.o.   MRN: [id number]  HPI Jasmine Schultz is a 40 year old with history of allergic rhinitis, postnasal drip, seasonal allergies, GERD.   Jasmine Schultz was admitted in sept 16 for cough, wheezing, dyspnea. Jasmine Schultz presented with 1 day of sore throat and cough associated with wheezing and dyspnea. Jasmine Schultz went to urgent care where Jasmine Schultz was given Decadron 10 mg IM. Jasmine Schultz was also given epi, albuterol and Atrovent nebs and sent to ED. Jasmine Schultz reports that Jasmine Schultz felt Jasmine Schultz throat closing after Jasmine Schultz was given the nebulizers. Jasmine Schultz was put on CPAP briefly. Noted to have stridor and upper eyelids and diffuse bilateral wheezes. Jasmine Schultz was seen by ENT for a question of vocal cord dysfunction. Their examination revealed no evidence of VCD, airway edema or obstruction. 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 Jasmine Schultz was discharged in 1 day with albuterol inhalers, prednisone taper. Jasmine Schultz was prescribed Jasmine Schultz Dulera inhaler but Jasmine Schultz never used it. Jasmine Schultz is just on albuterol rescue inhaler. Jasmine Schultz uses it very gradually, probably less than one time a month.  In December Jasmine Schultz had another episode where Jasmine Schultz felt Jasmine Schultz throat closing up. Jasmine Schultz saw Jasmine Schultz primary care who gave Jasmine Schultz cough syrup, pednisone taper and restarted Jasmine Schultz GERD meds. Jasmine Schultz was also taken off all diary products. Jasmine Schultz had food allergy testing done which showed sensitivity to chocolate, peanuts, oysters.  On return to clinic today Jasmine Schultz states that Jasmine Schultz feels much better with improvement in Jasmine Schultz symptoms. Denies any resp symptoms.  DATA: PFTs 07/30/15 FVC 2.86 [89%) FEV1 2.31 (87%) F/F 81 TLC 69% DLCO 90% Minimal obstructive disease as shown by curvature to the flow volume loop. Mild restriction. No bronchodilator response.  Chest x-ray 06/05/15 No acute cardiopulmonary process.  CBC 06/05/15. WBC 10.2, eosinophils 3%  Social History: Jasmine Schultz is a never smoker, no alcohol, illegal drug use. Jasmine Schultz is exposed to a lot of  secondhand cigarette smoke at Jasmine Schultz workplace. Jasmine Schultz is trying to avoid Jasmine Schultz exposure to smoke.  Family History: Mother-asthma Grandmother-breast cancer  Past Medical History  Diagnosis Date  . Multiple allergies   . Sun allergy   . Bursitis   . Tendonitis   . Migraine   . IBS (irritable bowel syndrome)   . GERD (gastroesophageal reflux disease)   . Chronically dry eyes   . Anxiety attack     Current outpatient prescriptions:  .  albuterol (PROVENTIL HFA;VENTOLIN HFA) 108 (90 BASE) MCG/ACT inhaler, Inhale 2 puffs into the lungs every 6 (six) hours as needed for wheezing or shortness of breath., Disp: 1 Inhaler, Rfl: 0 .  b complex vitamins tablet, Take 1 tablet by mouth daily., Disp: , Rfl:  .  beta carotene 25000 UNIT capsule, Take 25,000 Units by mouth daily., Disp: , Rfl:  .  Bilberry, Vaccinium myrtillus, (BILBERRY PO), Take 1 tablet by mouth daily., Disp: , Rfl:  .  BIOTIN PO, Take 1 tablet by mouth daily., Disp: , Rfl:  .  Borage, Borago officinalis, (BORAGE OIL) 500 MG CAPS, Take by mouth., Disp: , Rfl:  .  Brewers Yeast TABS, Take by mouth., Disp: , Rfl:  .  Cholecalciferol (VITAMIN D-3 PO), Take 1 capsule by mouth daily., Disp: , Rfl:  .  CINNAMON PO, Take 1 tablet by mouth daily., Disp: , Rfl:  .  clobetasol cream (TEMOVATE) 0.05 %, as needed., Disp: , Rfl: 3 .  Coenzyme Q10 (CO Q 10) 100 MG CAPS,  Take by mouth., Disp: , Rfl:  .  Cranberry-Milk Thistle (LIVER & KIDNEY CLEANSER PO), Take by mouth., Disp: , Rfl:  .  ECHINACEA PO, Take 1 tablet by mouth daily., Disp: , Rfl:  .  EVENING PRIMROSE OIL PO, Take 1 tablet by mouth daily., Disp: , Rfl:  .  ferrous sulfate 325 (65 FE) MG tablet, Take 325 mg by mouth daily with breakfast., Disp: , Rfl:  .  Fluocin-Hydroquinone-Tretinoin (TRI-LUMA EX), Apply topically., Disp: , Rfl:  .  fluticasone (CUTIVATE) 0.05 % cream, Apply topically 2 (two) times daily., Disp: , Rfl:  .  fluticasone (FLONASE) 50 MCG/ACT nasal spray, Place 1 spray  into both nostrils at bedtime as needed. , Disp: , Rfl:  .  folic acid (FOLVITE) 1 MG tablet, Take 1 mg by mouth daily., Disp: , Rfl:  .  Ginkgo Biloba 120 MG TABS, Take by mouth., Disp: , Rfl:  .  Korean Panax Ginseng 100 MG CAPS, Take by mouth., Disp: , Rfl:  .  Lecithin 1200 MG CAPS, Take by mouth., Disp: , Rfl:  .  loratadine (CLARITIN) 10 MG tablet, Take 10 mg by mouth daily., Disp: , Rfl:  .  Magnesium 250 MG TABS, Take by mouth., Disp: , Rfl:  .  Melatonin 10 MG TABS, Take by mouth., Disp: , Rfl:  .  milk thistle 175 MG tablet, Take 175 mg by mouth daily., Disp: , Rfl:  .  Misc Natural Products (COLON CLEANSER PO), Take by mouth., Disp: , Rfl:  .  montelukast (SINGULAIR) 10 MG tablet, Take 1 tablet (10 mg total) by mouth at bedtime., Disp: 30 tablet, Rfl: 0 .  Multiple Vitamins-Minerals (HAIR/SKIN/NAILS PO), Take by mouth., Disp: , Rfl:  .  nystatin (MYCOSTATIN/NYSTOP) 100000 UNIT/GM POWD, daily., Disp: , Rfl: 1 .  Omega-3 Fatty Acids (FISH OIL PO), Take 1 capsule by mouth daily., Disp: , Rfl:  .  ondansetron (ZOFRAN) 4 MG tablet, Take 1 tablet (4 mg total) by mouth every 8 (eight) hours as needed for nausea or vomiting., Disp: 20 tablet, Rfl: 0 .  pantoprazole (PROTONIX) 40 MG tablet, Take 1 tablet (40 mg total) by mouth 2 (two) times daily before a meal., Disp: 60 tablet, Rfl: 0 .  Potassium 99 MG TABS, Take by mouth., Disp: , Rfl:  .  Prenatal Vit-Fe Fumarate-FA (PRENATAL MULTIVITAMIN) TABS tablet, Take 1 tablet by mouth daily at 12 noon., Disp: , Rfl:  .  Red Yeast Rice Extract (RED YEAST RICE PO), Take 1 tablet by mouth daily., Disp: , Rfl:  .  selenium 50 MCG TABS tablet, Take 50 mcg by mouth daily., Disp: , Rfl:  .  triamcinolone cream (KENALOG) 0.1 %, Apply 1 application topically 2 (two) times daily., Disp: , Rfl:  .  Turmeric Curcumin 500 MG CAPS, Take by mouth., Disp: , Rfl:  .  mometasone-formoterol (DULERA) 100-5 MCG/ACT AERO, Inhale 2 puffs into the lungs 2 (two) times  daily. (Patient not taking: Reported on 12/04/2015), Disp: 13 g, Rfl: 0  Review of Systems Denies any cough, wheezing, dyspnea, sputum production, hemoptysis. Denies any chest pain, palpitation. Denies any nausea, vomiting, diarrhea, consultation. His any fevers, chills, malaise. All other review of systems negative    Objective:   Physical Exam Blood pressure 112/78, pulse 83, height 5' 5.5" (1.664 m), weight 291 lb 6.4 oz (132.178 kg), SpO2 98 %.  Gen:  No apparent distress Neuro: No gross focal deficits. Neck: No JVD, lymphadenopathy, thyromegaly. RS: Clear, no wheeze, crackles.  CVS: S1-S2 heard, no murmurs rubs gallops. Abdomen: Soft, positive bowel sounds. Extremities: No edema.    Assessment & Plan:  Follow up for stridor, dyspnea  Jasmine Schultz has mainly upper airway problems. The etiology of the intermittent stridor is not entirely clear. There is no evidence of vocal cord dysfunction or airway obstruction by ENT examination last year. Jasmine Schultz could be having intermittent vocal cord spasms from laryngeal pharyngeal reflux. Jasmine Schultz issues with allergic rhinitis, sinusitis, postnasal drip and GERD are contributing to Jasmine Schultz problems. I suspect that anxiety is a component as well. Jasmine Schultz has multiple food allergies and Jasmine Schultz symptoms are better now after Jasmine Schultz is avoiding certain types of food.   I am not convinced that Jasmine Schultz has reactive airway disease/asthma. PFTs look OK except for mild restriction that is likely secondary to Jasmine Schultz obesity, body habitus. I have discussed adding Dulera to Jasmine Schultz medication but we have decided to hold off for now as Jasmine Schultz appears well controlled on albuterol PRN. Jasmine Schultz has no eosinophilia. I will check a baseline IgE levels.  PLAN: - Continue albuterol rescue inhaler - Check IgE  Marshell Garfinkel MD [facility] Pager [phone] If no answer or after 3pm call: [phone] 12/04/2015, 10:23 AM  Plan:

## 2015-12-04 NOTE — Patient Instructions (Signed)
Continue using the albuterol rescue inhaler as needed. Continue using antiallergy medications and antidepressant medication. She to use the Protonix twice daily. We will send you for some blood work to check IgE levels today.  Return to clinic in 6 months.

## 2015-12-07 LAB — IGE: IgE (Immunoglobulin E), Serum: 37 kU/L (ref <115)

## 2017-02-06 DIAGNOSIS — R09A2 Foreign body sensation, throat: Secondary | ICD-10-CM | POA: Insufficient documentation

## 2017-02-06 DIAGNOSIS — K219 Gastro-esophageal reflux disease without esophagitis: Secondary | ICD-10-CM | POA: Insufficient documentation

## 2017-02-06 DIAGNOSIS — H9193 Unspecified hearing loss, bilateral: Secondary | ICD-10-CM | POA: Insufficient documentation

## 2018-03-29 ENCOUNTER — Other Ambulatory Visit: Payer: Self-pay | Admitting: Obstetrics & Gynecology

## 2018-03-29 DIAGNOSIS — Z1231 Encounter for screening mammogram for malignant neoplasm of breast: Secondary | ICD-10-CM

## 2018-03-30 ENCOUNTER — Ambulatory Visit
Admission: RE | Admit: 2018-03-30 | Discharge: 2018-03-30 | Disposition: A | Discharge status: Home / Self Care | Payer: Managed Care, Other (non HMO) | Source: Ambulatory Visit | Attending: Obstetrics & Gynecology | Admitting: Obstetrics & Gynecology

## 2018-03-30 ENCOUNTER — Encounter: Payer: Self-pay | Admitting: Radiology

## 2018-03-30 DIAGNOSIS — Z1231 Encounter for screening mammogram for malignant neoplasm of breast: Secondary | ICD-10-CM

## 2018-03-30 HISTORY — DX: Unspecified asthma, uncomplicated: J45.909

## 2018-05-17 ENCOUNTER — Other Ambulatory Visit: Payer: Self-pay

## 2018-05-17 ENCOUNTER — Emergency Department: Payer: Managed Care, Other (non HMO)

## 2018-05-17 ENCOUNTER — Encounter: Payer: Self-pay | Admitting: Intensive Care

## 2018-05-17 ENCOUNTER — Inpatient Hospital Stay
Admission: EM | Admit: 2018-05-17 | Discharge: 2018-05-23 | DRG: 871 | Disposition: A | Discharge status: Home / Self Care | Payer: Managed Care, Other (non HMO) | Attending: Internal Medicine | Admitting: Internal Medicine

## 2018-05-17 DIAGNOSIS — T782XXA Anaphylactic shock, unspecified, initial encounter: Secondary | ICD-10-CM

## 2018-05-17 DIAGNOSIS — A419 Sepsis, unspecified organism: Secondary | ICD-10-CM | POA: Diagnosis not present

## 2018-05-17 DIAGNOSIS — I2699 Other pulmonary embolism without acute cor pulmonale: Secondary | ICD-10-CM

## 2018-05-17 DIAGNOSIS — I10 Essential (primary) hypertension: Secondary | ICD-10-CM | POA: Diagnosis present

## 2018-05-17 DIAGNOSIS — G9349 Other encephalopathy: Secondary | ICD-10-CM | POA: Diagnosis present

## 2018-05-17 DIAGNOSIS — Z6841 Body Mass Index (BMI) 40.0 and over, adult: Secondary | ICD-10-CM | POA: Diagnosis not present

## 2018-05-17 DIAGNOSIS — G4730 Sleep apnea, unspecified: Secondary | ICD-10-CM | POA: Diagnosis present

## 2018-05-17 DIAGNOSIS — J4552 Severe persistent asthma with status asthmaticus: Secondary | ICD-10-CM | POA: Diagnosis not present

## 2018-05-17 DIAGNOSIS — J9622 Acute and chronic respiratory failure with hypercapnia: Secondary | ICD-10-CM | POA: Diagnosis present

## 2018-05-17 DIAGNOSIS — Z79890 Hormone replacement therapy (postmenopausal): Secondary | ICD-10-CM

## 2018-05-17 DIAGNOSIS — K589 Irritable bowel syndrome without diarrhea: Secondary | ICD-10-CM | POA: Diagnosis present

## 2018-05-17 DIAGNOSIS — F411 Generalized anxiety disorder: Secondary | ICD-10-CM | POA: Diagnosis present

## 2018-05-17 DIAGNOSIS — J384 Edema of larynx: Secondary | ICD-10-CM | POA: Diagnosis present

## 2018-05-17 DIAGNOSIS — J302 Other seasonal allergic rhinitis: Secondary | ICD-10-CM | POA: Diagnosis present

## 2018-05-17 DIAGNOSIS — J45901 Unspecified asthma with (acute) exacerbation: Secondary | ICD-10-CM | POA: Diagnosis present

## 2018-05-17 DIAGNOSIS — Z0189 Encounter for other specified special examinations: Secondary | ICD-10-CM

## 2018-05-17 DIAGNOSIS — Z888 Allergy status to other drugs, medicaments and biological substances status: Secondary | ICD-10-CM

## 2018-05-17 DIAGNOSIS — J9600 Acute respiratory failure, unspecified whether with hypoxia or hypercapnia: Secondary | ICD-10-CM | POA: Diagnosis present

## 2018-05-17 DIAGNOSIS — Z91010 Allergy to peanuts: Secondary | ICD-10-CM | POA: Diagnosis not present

## 2018-05-17 DIAGNOSIS — Z91013 Allergy to seafood: Secondary | ICD-10-CM

## 2018-05-17 DIAGNOSIS — Z825 Family history of asthma and other chronic lower respiratory diseases: Secondary | ICD-10-CM

## 2018-05-17 DIAGNOSIS — Z79899 Other long term (current) drug therapy: Secondary | ICD-10-CM

## 2018-05-17 DIAGNOSIS — F410 Panic disorder [episodic paroxysmal anxiety] without agoraphobia: Secondary | ICD-10-CM | POA: Diagnosis present

## 2018-05-17 DIAGNOSIS — N179 Acute kidney failure, unspecified: Secondary | ICD-10-CM | POA: Diagnosis present

## 2018-05-17 DIAGNOSIS — E669 Obesity, unspecified: Secondary | ICD-10-CM | POA: Diagnosis present

## 2018-05-17 DIAGNOSIS — E876 Hypokalemia: Secondary | ICD-10-CM | POA: Diagnosis present

## 2018-05-17 DIAGNOSIS — Z7951 Long term (current) use of inhaled steroids: Secondary | ICD-10-CM

## 2018-05-17 DIAGNOSIS — J189 Pneumonia, unspecified organism: Secondary | ICD-10-CM | POA: Diagnosis present

## 2018-05-17 DIAGNOSIS — K219 Gastro-esophageal reflux disease without esophagitis: Secondary | ICD-10-CM | POA: Diagnosis present

## 2018-05-17 DIAGNOSIS — J9621 Acute and chronic respiratory failure with hypoxia: Secondary | ICD-10-CM

## 2018-05-17 DIAGNOSIS — Z978 Presence of other specified devices: Secondary | ICD-10-CM

## 2018-05-17 DIAGNOSIS — D638 Anemia in other chronic diseases classified elsewhere: Secondary | ICD-10-CM | POA: Diagnosis present

## 2018-05-17 DIAGNOSIS — J9811 Atelectasis: Secondary | ICD-10-CM

## 2018-05-17 DIAGNOSIS — Z91018 Allergy to other foods: Secondary | ICD-10-CM

## 2018-05-17 LAB — URINALYSIS, COMPLETE (UACMP) WITH MICROSCOPIC
BILIRUBIN URINE: NEGATIVE
Bacteria, UA: NONE SEEN
Glucose, UA: >=500 mg/dL — AB
HGB URINE DIPSTICK: NEGATIVE
Ketones, ur: NEGATIVE mg/dL
Leukocytes, UA: NEGATIVE
NITRITE: NEGATIVE
PH: 5 (ref 5.0–8.0)
Protein, ur: NEGATIVE mg/dL
Specific Gravity, Urine: 1.014 (ref 1.005–1.030)
Squamous Epithelial / LPF: NONE SEEN (ref 0–5)

## 2018-05-17 LAB — CBC
HCT: 29.6 % — ABNORMAL LOW (ref 35.0–47.0)
HCT: 29.6 % — ABNORMAL LOW (ref 35.0–47.0)
HEMOGLOBIN: 9.8 g/dL — AB (ref 12.0–16.0)
HEMOGLOBIN: 9.4 g/dL — AB (ref 12.0–16.0)
MCH: 26.8 pg (ref 26.0–34.0)
MCH: 25.7 pg — ABNORMAL LOW (ref 26.0–34.0)
MCHC: 33.1 g/dL (ref 32.0–36.0)
MCHC: 31.7 g/dL — ABNORMAL LOW (ref 32.0–36.0)
MCV: 80.9 fL (ref 80.0–100.0)
MCV: 81.1 fL (ref 80.0–100.0)
PLATELETS: 620 10*3/uL — AB (ref 150–440)
PLATELETS: 638 10*3/uL — AB (ref 150–440)
RBC: 3.66 MIL/uL — ABNORMAL LOW (ref 3.80–5.20)
RBC: 3.65 MIL/uL — AB (ref 3.80–5.20)
RDW: 15.9 % — AB (ref 11.5–14.5)
RDW: 15.6 % — ABNORMAL HIGH (ref 11.5–14.5)
WBC: 11 10*3/uL (ref 3.6–11.0)
WBC: 19.7 10*3/uL — AB (ref 3.6–11.0)

## 2018-05-17 LAB — BLOOD GAS, ARTERIAL
Acid-base deficit: 5.1 mmol/L — ABNORMAL HIGH (ref 0.0–2.0)
Acid-base deficit: 8 mmol/L — ABNORMAL HIGH (ref 0.0–2.0)
BICARBONATE: 19.9 mmol/L — AB (ref 20.0–28.0)
Bicarbonate: 23.1 mmol/L (ref 20.0–28.0)
FIO2: 0.4
FIO2: 40
MECHANICAL RATE: 26
MECHVT: 450 mL
Mechanical Rate: 20
O2 Saturation: 94.9 %
O2 Saturation: 98.1 %
PEEP: 5 cmH2O
PEEP: 5 cmH2O
Patient temperature: 37
Patient temperature: 37
VT: 400 mL
pCO2 arterial: 59 mmHg — ABNORMAL HIGH (ref 32.0–48.0)
pCO2 arterial: 51 mmHg — ABNORMAL HIGH (ref 32.0–48.0)
pH, Arterial: 7.2 — ABNORMAL LOW (ref 7.350–7.450)
pH, Arterial: 7.2 — ABNORMAL LOW (ref 7.350–7.450)
pO2, Arterial: 91 mmHg (ref 83.0–108.0)
pO2, Arterial: 126 mmHg — ABNORMAL HIGH (ref 83.0–108.0)

## 2018-05-17 LAB — URINE DRUG SCREEN, QUALITATIVE (ARMC ONLY)
Amphetamines, Ur Screen: NOT DETECTED
BARBITURATES, UR SCREEN: NOT DETECTED
CANNABINOID 50 NG, UR ~~LOC~~: NOT DETECTED
COCAINE METABOLITE, UR ~~LOC~~: NOT DETECTED
MDMA (ECSTASY) UR SCREEN: NOT DETECTED
Methadone Scn, Ur: NOT DETECTED
OPIATE, UR SCREEN: POSITIVE — AB
Phencyclidine (PCP) Ur S: NOT DETECTED
TRICYCLIC, UR SCREEN: NOT DETECTED

## 2018-05-17 LAB — BASIC METABOLIC PANEL
ANION GAP: 9 (ref 5–15)
BUN: 8 mg/dL (ref 6–20)
CALCIUM: 8.4 mg/dL — AB (ref 8.9–10.3)
CO2: 20 mmol/L — ABNORMAL LOW (ref 22–32)
CREATININE: 0.81 mg/dL (ref 0.44–1.00)
Chloride: 111 mmol/L (ref 98–111)
GFR calc Af Amer: >60 mL/min (ref >60)
GLUCOSE: 154 mg/dL — AB (ref 70–99)
Potassium: 3.3 mmol/L — ABNORMAL LOW (ref 3.5–5.1)
Sodium: 140 mmol/L (ref 135–145)

## 2018-05-17 LAB — MRSA PCR SCREENING: MRSA by PCR: NEGATIVE

## 2018-05-17 LAB — GLUCOSE, CAPILLARY
GLUCOSE-CAPILLARY: 255 mg/dL — AB (ref 70–99)
GLUCOSE-CAPILLARY: 260 mg/dL — AB (ref 70–99)
Glucose-Capillary: 247 mg/dL — ABNORMAL HIGH (ref 70–99)

## 2018-05-17 LAB — CREATININE, SERUM: CREATININE: 1 mg/dL (ref 0.44–1.00)

## 2018-05-17 LAB — HCG, QUANTITATIVE, PREGNANCY

## 2018-05-17 LAB — TRIGLYCERIDES: Triglycerides: 124 mg/dL (ref <150)

## 2018-05-17 LAB — TROPONIN I

## 2018-05-17 MED ORDER — BUDESONIDE 0.5 MG/2ML IN SUSP
0.5000 mg | Freq: Two times a day (BID) | RESPIRATORY_TRACT | Status: DC
  Administered 2018-05-18 – 2018-05-23 (×11): 0.5 mg via RESPIRATORY_TRACT
  Filled 2018-05-17 (×11): qty 2

## 2018-05-17 MED ORDER — VECURONIUM BROMIDE 10 MG IV SOLR
10.0000 mg | Freq: Once | INTRAVENOUS | Status: AC
  Administered 2018-05-17: 10 mg via INTRAVENOUS
  Filled 2018-05-17: qty 10

## 2018-05-17 MED ORDER — ACETAMINOPHEN 325 MG PO TABS
650.0000 mg | ORAL_TABLET | Freq: Four times a day (QID) | ORAL | Status: DC | PRN
  Administered 2018-05-21 – 2018-05-23 (×2): 650 mg via ORAL
  Filled 2018-05-17 (×3): qty 2

## 2018-05-17 MED ORDER — POTASSIUM CHLORIDE 10 MEQ/100ML IV SOLN
10.0000 meq | INTRAVENOUS | Status: AC
  Administered 2018-05-17 (×2): 10 meq via INTRAVENOUS
  Filled 2018-05-17 (×2): qty 100

## 2018-05-17 MED ORDER — HEPARIN SODIUM (PORCINE) 5000 UNIT/ML IJ SOLN
5000.0000 [IU] | Freq: Three times a day (TID) | INTRAMUSCULAR | Status: DC
  Administered 2018-05-17 – 2018-05-21 (×12): 5000 [IU] via SUBCUTANEOUS
  Filled 2018-05-17 (×12): qty 1

## 2018-05-17 MED ORDER — HYDROMORPHONE HCL 1 MG/ML IJ SOLN
2.0000 mg | Freq: Once | INTRAMUSCULAR | Status: AC
  Administered 2018-05-17: 2 mg via INTRAVENOUS

## 2018-05-17 MED ORDER — SENNOSIDES 8.8 MG/5ML PO SYRP
5.0000 mL | ORAL_SOLUTION | Freq: Two times a day (BID) | ORAL | Status: DC | PRN
  Administered 2018-05-20: 5 mL
  Filled 2018-05-17 (×2): qty 5

## 2018-05-17 MED ORDER — IPRATROPIUM-ALBUTEROL 0.5-2.5 (3) MG/3ML IN SOLN
3.0000 mL | RESPIRATORY_TRACT | Status: DC
  Administered 2018-05-17 – 2018-05-21 (×24): 3 mL via RESPIRATORY_TRACT
  Filled 2018-05-17 (×24): qty 3

## 2018-05-17 MED ORDER — STERILE WATER FOR INJECTION IJ SOLN
INTRAMUSCULAR | Status: AC
  Administered 2018-05-17: 10 mL
  Filled 2018-05-17: qty 10

## 2018-05-17 MED ORDER — EPINEPHRINE PF 1 MG/ML IJ SOLN
0.5000 ug/min | INTRAMUSCULAR | Status: DC
  Administered 2018-05-17 – 2018-05-18 (×2): 10 ug/min via INTRAVENOUS
  Filled 2018-05-17 (×2): qty 4

## 2018-05-17 MED ORDER — HYDROCODONE-ACETAMINOPHEN 5-325 MG PO TABS: 1.0000 | ORAL_TABLET | ORAL | Status: DC | PRN

## 2018-05-17 MED ORDER — ONDANSETRON HCL 4 MG/2ML IJ SOLN: 4.0000 mg | Freq: Four times a day (QID) | INTRAMUSCULAR | Status: DC | PRN

## 2018-05-17 MED ORDER — PROPOFOL 1000 MG/100ML IV EMUL
5.0000 ug/kg/min | Freq: Once | INTRAVENOUS | Status: AC
  Administered 2018-05-17: 5 ug/kg/min via INTRAVENOUS
  Administered 2018-05-17: 50 ug/kg/min via INTRAVENOUS

## 2018-05-17 MED ORDER — MAGNESIUM SULFATE 2 GM/50ML IV SOLN
2.0000 g | Freq: Once | INTRAVENOUS | Status: AC
  Administered 2018-05-17: 2 g via INTRAVENOUS
  Filled 2018-05-17: qty 50

## 2018-05-17 MED ORDER — VECURONIUM BOLUS VIA INFUSION: 10.0000 mg | Freq: Once | INTRAVENOUS | Status: DC

## 2018-05-17 MED ORDER — PROPOFOL 1000 MG/100ML IV EMUL: 5.0000 ug/kg/min | Freq: Once | INTRAVENOUS | Status: DC

## 2018-05-17 MED ORDER — IPRATROPIUM-ALBUTEROL 0.5-2.5 (3) MG/3ML IN SOLN
3.0000 mL | Freq: Once | RESPIRATORY_TRACT | Status: AC
  Administered 2018-05-17: 3 mL via RESPIRATORY_TRACT
  Filled 2018-05-17: qty 3

## 2018-05-17 MED ORDER — PANTOPRAZOLE SODIUM 40 MG PO PACK
40.0000 mg | PACK | Freq: Two times a day (BID) | ORAL | Status: DC
  Administered 2018-05-17: 40 mg
  Filled 2018-05-17: qty 20

## 2018-05-17 MED ORDER — PROPOFOL 1000 MG/100ML IV EMUL
INTRAVENOUS | Status: AC
  Administered 2018-05-17: 5 ug/kg/min via INTRAVENOUS
  Filled 2018-05-17: qty 100

## 2018-05-17 MED ORDER — DIPHENHYDRAMINE HCL 50 MG/ML IJ SOLN
12.5000 mg | Freq: Three times a day (TID) | INTRAMUSCULAR | Status: DC
  Administered 2018-05-17 – 2018-05-18 (×2): 12.5 mg via INTRAVENOUS
  Filled 2018-05-17 (×2): qty 1

## 2018-05-17 MED ORDER — CHLORHEXIDINE GLUCONATE 0.12% ORAL RINSE (MEDLINE KIT)
15.0000 mL | Freq: Two times a day (BID) | OROMUCOSAL | Status: DC
  Administered 2018-05-17 – 2018-05-20 (×6): 15 mL via OROMUCOSAL

## 2018-05-17 MED ORDER — METHYLPREDNISOLONE SODIUM SUCC 125 MG IJ SOLR
125.0000 mg | Freq: Once | INTRAMUSCULAR | Status: AC
  Administered 2018-05-17: 125 mg via INTRAVENOUS
  Filled 2018-05-17: qty 2

## 2018-05-17 MED ORDER — EPINEPHRINE 0.3 MG/0.3ML IJ SOAJ
0.3000 mg | Freq: Once | INTRAMUSCULAR | Status: AC
  Administered 2018-05-17: 0.3 mg via INTRAMUSCULAR

## 2018-05-17 MED ORDER — FENTANYL 2500MCG IN NS 250ML (10MCG/ML) PREMIX INFUSION
0.0000 ug/h | INTRAVENOUS | Status: DC
  Administered 2018-05-17: 40 ug/h via INTRAVENOUS
  Administered 2018-05-18: 100 ug/h via INTRAVENOUS
  Administered 2018-05-19: 250 ug/h via INTRAVENOUS
  Administered 2018-05-19: 200 ug/h via INTRAVENOUS
  Administered 2018-05-19 – 2018-05-20 (×2): 300 ug/h via INTRAVENOUS
  Filled 2018-05-17 (×8): qty 250

## 2018-05-17 MED ORDER — ACETAMINOPHEN 650 MG RE SUPP: 650.0000 mg | Freq: Four times a day (QID) | RECTAL | Status: DC | PRN

## 2018-05-17 MED ORDER — PROPOFOL 10 MG/ML IV BOLUS
INTRAVENOUS | Status: AC
  Filled 2018-05-17: qty 20

## 2018-05-17 MED ORDER — KETAMINE HCL 10 MG/ML IJ SOLN
200.0000 mg | Freq: Once | INTRAMUSCULAR | Status: AC
  Administered 2018-05-17: 200 mg via INTRAVENOUS

## 2018-05-17 MED ORDER — SODIUM CHLORIDE 0.9 % IV SOLN
INTRAVENOUS | Status: DC
  Administered 2018-05-17: 21:00:00 via INTRAVENOUS

## 2018-05-17 MED ORDER — TRAZODONE HCL 50 MG PO TABS
25.0000 mg | ORAL_TABLET | Freq: Every evening | ORAL | Status: DC | PRN
Start: 2018-05-17 — End: 2018-05-18

## 2018-05-17 MED ORDER — METHYLPREDNISOLONE SODIUM SUCC 125 MG IJ SOLR: 60.0000 mg | Freq: Two times a day (BID) | INTRAMUSCULAR | Status: DC

## 2018-05-17 MED ORDER — IPRATROPIUM-ALBUTEROL 0.5-2.5 (3) MG/3ML IN SOLN: 3.0000 mL | Freq: Four times a day (QID) | RESPIRATORY_TRACT | Status: DC | PRN

## 2018-05-17 MED ORDER — EPINEPHRINE 0.3 MG/0.3ML IJ SOAJ
INTRAMUSCULAR | Status: AC
  Administered 2018-05-17: 0.3 mg via INTRAMUSCULAR
  Filled 2018-05-17: qty 0.6

## 2018-05-17 MED ORDER — HYDROMORPHONE HCL 1 MG/ML IJ SOLN
INTRAMUSCULAR | Status: AC
  Administered 2018-05-17: 2 mg via INTRAVENOUS
  Filled 2018-05-17: qty 2

## 2018-05-17 MED ORDER — ORAL CARE MOUTH RINSE
15.0000 mL | OROMUCOSAL | Status: DC
  Administered 2018-05-17 – 2018-05-21 (×31): 15 mL via OROMUCOSAL

## 2018-05-17 MED ORDER — METHYLPREDNISOLONE SODIUM SUCC 125 MG IJ SOLR: 60.0000 mg | Freq: Three times a day (TID) | INTRAMUSCULAR | Status: DC

## 2018-05-17 MED ORDER — PROPOFOL 1000 MG/100ML IV EMUL
0.0000 ug/kg/min | INTRAVENOUS | Status: DC
  Administered 2018-05-17 (×2): 50 ug/kg/min via INTRAVENOUS
  Administered 2018-05-18: 35 ug/kg/min via INTRAVENOUS
  Administered 2018-05-18 (×2): 40 ug/kg/min via INTRAVENOUS
  Administered 2018-05-18 (×3): 50 ug/kg/min via INTRAVENOUS
  Administered 2018-05-18: 35 ug/kg/min via INTRAVENOUS
  Administered 2018-05-18: 50 ug/kg/min via INTRAVENOUS
  Administered 2018-05-19 (×5): 35 ug/kg/min via INTRAVENOUS
  Filled 2018-05-17 (×15): qty 100

## 2018-05-17 MED ORDER — HYDROMORPHONE HCL 1 MG/ML IJ SOLN
INTRAMUSCULAR | Status: AC
  Filled 2018-05-17: qty 2

## 2018-05-17 MED ORDER — INSULIN ASPART 100 UNIT/ML ~~LOC~~ SOLN
0.0000 [IU] | SUBCUTANEOUS | Status: DC
  Administered 2018-05-17: 11 [IU] via SUBCUTANEOUS
  Administered 2018-05-17: 7 [IU] via SUBCUTANEOUS
  Administered 2018-05-18 (×3): 3 [IU] via SUBCUTANEOUS
  Administered 2018-05-18: 4 [IU] via SUBCUTANEOUS
  Administered 2018-05-18 – 2018-05-20 (×7): 3 [IU] via SUBCUTANEOUS
  Filled 2018-05-17 (×13): qty 1

## 2018-05-17 MED ORDER — METHYLPREDNISOLONE SODIUM SUCC 125 MG IJ SOLR
60.0000 mg | Freq: Three times a day (TID) | INTRAMUSCULAR | Status: DC
  Administered 2018-05-18 – 2018-05-20 (×7): 60 mg via INTRAVENOUS
  Filled 2018-05-17 (×7): qty 2

## 2018-05-17 MED ORDER — POTASSIUM CHLORIDE IN NACL 20-0.9 MEQ/L-% IV SOLN
INTRAVENOUS | Status: DC
Start: 2018-05-17 — End: 2018-05-17
  Filled 2018-05-17 (×2): qty 1000

## 2018-05-17 MED ORDER — FENTANYL CITRATE (PF) 100 MCG/2ML IJ SOLN
50.0000 ug | Freq: Once | INTRAMUSCULAR | Status: AC
  Administered 2018-05-17: 50 ug via INTRAVENOUS

## 2018-05-17 MED ORDER — FENTANYL BOLUS VIA INFUSION
50.0000 ug | INTRAVENOUS | Status: DC | PRN
  Administered 2018-05-18 – 2018-05-19 (×4): 50 ug via INTRAVENOUS
  Filled 2018-05-17: qty 50

## 2018-05-17 MED ORDER — SUCCINYLCHOLINE CHLORIDE 20 MG/ML IJ SOLN
200.0000 mg | Freq: Once | INTRAMUSCULAR | Status: AC
  Administered 2018-05-17: 200 mg via INTRAVENOUS

## 2018-05-17 MED ORDER — IPRATROPIUM-ALBUTEROL 0.5-2.5 (3) MG/3ML IN SOLN
RESPIRATORY_TRACT | Status: AC
  Administered 2018-05-17: 3 mL via RESPIRATORY_TRACT
  Filled 2018-05-17: qty 6

## 2018-05-17 MED ORDER — ONDANSETRON HCL 4 MG PO TABS: 4.0000 mg | ORAL_TABLET | Freq: Four times a day (QID) | ORAL | Status: DC | PRN

## 2018-05-17 MED ORDER — SODIUM CHLORIDE 0.9 % IV BOLUS
1000.0000 mL | Freq: Once | INTRAVENOUS | Status: AC
  Administered 2018-05-17: 1000 mL via INTRAVENOUS

## 2018-05-17 NOTE — H&P (Addendum)
[facility] NAME: Jasmine Schultz    MR#:  [id number]  DATE OF BIRTH:  05-20-1976  DATE OF ADMISSION:  05/17/2018  PRIMARY CARE PHYSICIAN: Glendale Chard, MD   REQUESTING/REFERRING PHYSICIAN: Dr. Mable Paris  CHIEF COMPLAINT: Shortness of breath   Chief Complaint  Patient presents with  . Shortness of Breath    HISTORY OF PRESENT ILLNESS:  Jasmine Schultz  is a 42 y.o. female with a known history of asthma, seasonal allergies came in because of sudden onset of shortness of breath started this afternoon after she woke up from a nap.  Patient had severe shortness of breath associated tachypnea, wheezing when she came to emergency room.  Intubated in the emergency room , ER physician noted that she has laryngeal edema.  Patient received multiple doses of bronchodilator, magnesium, 2 doses of epinephrine, started on epinephrine drip, now she is intubated and she is on propofol drip.  Patient blood work showed evidence of slight white count elevation up to 11, mild hypokalemia with potassium 3.3.   similarPresentation in 2016 and admitted to [facility] that time and diagnosed with reactive airway disease, had bedside fiberoptic laryngoscopy by ENT to evaluate for laryngeal edema, had fiberoptic laryngoscopy were negative, patient thought to have reactive airway disease, bronchospasm without epiglottitis.  That time she received Benadryl, Decadron, Atrovent, albuterol by EMS.  And discharged without intubation patient referred to allergist and also recommended to talk to PCP about starting medicines for anxiety.    PAST MEDICAL HISTORY:   Past Medical History:  Diagnosis Date  . Anxiety attack   . Asthma    diagnosed in march 2019  . Bursitis   . Chronically dry eyes   . GERD (gastroesophageal reflux disease)   . IBS (irritable bowel syndrome)   . Migraine   . Multiple allergies   . Sun allergy   . Tendonitis     PAST SURGICAL  HISTOIRY:   Past Surgical History:  Procedure Laterality Date  . BREAST REDUCTION SURGERY    . REDUCTION MAMMAPLASTY Bilateral    2002  . TUBAL LIGATION      SOCIAL HISTORY:   Social History   Tobacco Use  . Smoking status: Never Smoker  . Smokeless tobacco: Never Used  Substance Use Topics  . Alcohol use: Yes    Alcohol/week: 0.0 standard drinks    Comment: rarley    FAMILY HISTORY:   Family History  Problem Relation Age of Onset  . Asthma Mother   . CAD Unknown   . CVA Unknown   . Aneurysm Unknown        brain aneurysm multiple family members  . Breast cancer Unknown        grandmother  . Sickle cell anemia Unknown        maternal cousin    DRUG ALLERGIES:   Allergies  Allergen Reactions  . Tomato Anaphylaxis and Rash  . Chocolate   . Oysters [Shellfish Allergy] Hives  . Peanut Butter Flavor     Feels congested    REVIEW OF SYSTEMS:   intubated, sedated so unable to obtain review of systems. MEDICATIONS AT HOME:   Prior to Admission medications   Medication Sig Start Date End Date Taking? Authorizing Provider  albuterol (PROVENTIL HFA;VENTOLIN HFA) 108 (90 BASE) MCG/ACT inhaler Inhale 2 puffs into the lungs every 6 (six) hours as needed for wheezing or shortness of breath. 06/06/15   Janece Canterbury, MD  b complex vitamins tablet Take 1 tablet by mouth daily.    [provider]  beta carotene 25000 UNIT capsule Take 25,000 Units by mouth daily.    [provider]  Bilberry, Vaccinium myrtillus, (BILBERRY PO) Take 1 tablet by mouth daily.    [provider]  BIOTIN PO Take 1 tablet by mouth daily.    [provider]  Borage, Borago officinalis, (BORAGE OIL) 500 MG CAPS Take by mouth.    [provider]  Brewers Yeast TABS Take by mouth.    [provider]  Cholecalciferol (VITAMIN D-3 PO) Take 1 capsule by mouth daily.    [provider]  CINNAMON PO Take 1 tablet by mouth daily.     [provider]  clobetasol cream (TEMOVATE) 0.05 % as needed. 04/16/15   [provider]  Coenzyme Q10 (CO Q 10) 100 MG CAPS Take by mouth.    [provider]  Cranberry-Milk Thistle (LIVER & KIDNEY CLEANSER PO) Take by mouth.    [provider]  ECHINACEA PO Take 1 tablet by mouth daily.    [provider]  EVENING PRIMROSE OIL PO Take 1 tablet by mouth daily.    [provider]  ferrous sulfate 325 (65 FE) MG tablet Take 325 mg by mouth daily with breakfast.    [provider]  Fluocin-Hydroquinone-Tretinoin (TRI-LUMA EX) Apply topically.    [provider]  fluticasone (CUTIVATE) 0.05 % cream Apply topically 2 (two) times daily.    [provider]  fluticasone (FLONASE) 50 MCG/ACT nasal spray Place 1 spray into both nostrils at bedtime as needed.     [provider]  folic acid (FOLVITE) 1 MG tablet Take 1 mg by mouth daily.    [provider]  Ginkgo Biloba 120 MG TABS Take by mouth.    [provider]  Korean Panax Ginseng 100 MG CAPS Take by mouth.    [provider]  Lecithin 1200 MG CAPS Take by mouth.    [provider]  loratadine (CLARITIN) 10 MG tablet Take 10 mg by mouth daily.    [provider]  Magnesium 250 MG TABS Take by mouth.    [provider]  Melatonin 10 MG TABS Take by mouth.    [provider]  milk thistle 175 MG tablet Take 175 mg by mouth daily.    [provider]  Misc Natural Products (COLON CLEANSER PO) Take by mouth.    [provider]  mometasone-formoterol (DULERA) 100-5 MCG/ACT AERO Inhale 2 puffs into the lungs 2 (two) times daily. Patient not taking: Reported on 12/04/2015 06/06/15   Janece Canterbury, MD  montelukast (SINGULAIR) 10 MG tablet Take 1 tablet (10 mg total) by mouth at bedtime. 06/06/15   Janece Canterbury, MD  Multiple Vitamins-Minerals (HAIR/SKIN/NAILS PO) Take by mouth.     [provider]  nystatin (MYCOSTATIN/NYSTOP) 100000 UNIT/GM POWD daily.    [provider]  Omega-3 Fatty Acids (FISH OIL PO) Take 1 capsule by mouth daily.    [provider]  ondansetron (ZOFRAN) 4 MG tablet Take 1 tablet (4 mg total) by mouth every 8 (eight) hours as needed for nausea or vomiting. 06/06/15   Janece Canterbury, MD  pantoprazole (PROTONIX) 40 MG tablet Take 1 tablet (40 mg total) by mouth 2 (two) times daily before a meal. 06/06/15   Janece Canterbury, MD  Potassium 99 MG TABS Take by mouth.    [provider]  Prenatal Vit-Fe Fumarate-FA (PRENATAL MULTIVITAMIN) TABS tablet Take 1 tablet by mouth daily at 12 noon.    [provider]  Red Yeast Rice Extract (RED YEAST RICE PO) Take 1 tablet by mouth daily.    [provider]  selenium 50 MCG TABS tablet Take 50 mcg by mouth daily.    [provider]  triamcinolone cream (KENALOG) 0.1 % Apply 1 application topically 2 (two) times daily.    [provider]  Turmeric Curcumin 500 MG CAPS Take by mouth.    [provider]      VITAL SIGNS:  Blood pressure 134/74, pulse (!) 117, temperature 98.4 F (36.9 C), temperature source Oral, resp. rate 20, weight (!) 138.3 kg, SpO2 95 %.  PHYSICAL EXAMINATION:  GENERAL:  42 y.o.-year-old patient lying in the bed , currently intubated, sedated EYES: Pupils equal, round, reactive to light   . No scleral icterus.  HEENT: Head atraumatic, normocephalic.  Orally intubated . NECK:  Supple, no jugular venous distention. No thyroid enlargement,  LUNGS: Expiratory wheeze bilaterally. Marland Kitchen  CARDIOVASCULAR: S1, S2 tachycardic / no murmurs, rubs, or gallops.  ABDOMEN: Soft, , nondistended. Bowel sounds present. No organomegaly or mass.  EXTREMITIES: No pedal edema, cyanosis, or clubbing.  NEUROLOGIC: intubated, sedated.  Neurological exam not possible. PSYCHIATRIC: Sedated at this time sKIN: No obvious rash, lesion, or  ulcer.   LABORATORY PANEL:   CBC Recent Labs  Lab 05/17/18 1913  WBC 11.0  HGB 9.8*  HCT 29.6*  PLT 620*   ------------------------------------------------------------------------------------------------------------------  Chemistries  Recent Labs  Lab 05/17/18 1913  NA 140  K 3.3*  CL 111  CO2 20*  GLUCOSE 154*  BUN 8  CREATININE 0.81  CALCIUM 8.4*   ------------------------------------------------------------------------------------------------------------------  Cardiac Enzymes Recent Labs  Lab 05/17/18 1913  TROPONINI <0.03   ------------------------------------------------------------------------------------------------------------------  RADIOLOGY:  Dg Abdomen 1 View  Result Date: 05/17/2018 CLINICAL DATA:  Gastric tube placement EXAM: ABDOMEN - 1 VIEW COMPARISON:  None. FINDINGS: AP supine view of the abdomen demonstrates the tip and side port of a gastric tube below the diaphragm along the expected location of the greater curvature of the stomach. The tip projects over the right upper quadrant of the abdomen likely within distal stomach. Bowel gas pattern is unremarkable. There is no free air. IMPRESSION: Gastric tube in satisfactory position along the expected location of the stomach. Electronically Signed   By: Ashley Royalty M.D.   On: 05/17/2018 19:34   Dg Chest Portable 1 View  Result Date: 05/17/2018 CLINICAL DATA:  Hypoxia EXAM: PORTABLE CHEST 1 VIEW COMPARISON:  June 05, 2015 FINDINGS: Endotracheal tube tip is at the carina. No pneumothorax. There is no edema or consolidation. Heart is slightly enlarged with pulmonary vascularity normal. No adenopathy. No bone lesions. IMPRESSION: Endotracheal tube tip is at the carina. Advise withdrawing endotracheal tube approximately 3 cm. No edema or consolidation.  Stable cardiac prominence. Electronically Signed   By: Lowella Grip III M.D.   On: 05/17/2018 19:13    EKG:   Orders placed or performed during the  hospital encounter of 05/17/18  . ED EKG within 10 minutes  . ED EKG within 10 minutes    IMPRESSION AND PLAN:   42 year old female patient who is bili obese and has history of anxiety attacks, allergic rhinitis, reactive airway disease came in because of sudden onset of shortness of breath and found to have severe wheezing, increased respiratory rate by ER physician so intubated immediately and noted to  have laryngeal edema while intubating.  The patient is right now on iepinephrine drip. #1 acute respiratory failure with anaphylactic shock: Intubated, sedated, patient received multiple doses of bronchodilators, magnesium, epinephrine, intubated, sedated.  Continue heparin drip, propofol for sedation, continue IV neuritis, bronchodilators, Benadryl.  Chest x-ray is negative for pneumonia. 2.  Mild hypokalemia replace potassium and IV fluids. 3.  Anxiety, panic attacks: Patient is SSRIs before discharge. 4.  History of migraine headaches: Use ibuprofen after extubation for headaches. 5.  Mild leukocytosis: Likely stress-induced.  Watch closely.   All the records are reviewed and case discussed with ED provider. Management plans discussed with the patient, family and they are in agreement.  CODE STATUS: Full code  TOTAL TIME TAKING CARE OF THIS PATIENT: 30minutes.(critical care time)    Epifanio Lesches M.D on 05/17/2018 at 7:48 PM  Between 7am to 6pm - Pager - [phone]  After 6pm go to [email or web address] - password [email or web address] [facility]  Office  [phone]  CC: Primary care physician; Glendale Chard, MD  Note: This dictation was prepared with Dragon dictation along with smaller phrase technology. Any transcriptional errors that result from this process are unintentional.

## 2018-05-17 NOTE — Consult Note (Signed)
Name: Jasmine Schultz MRN: [id number] DOB: 1976/07/02    ADMISSION DATE:  05/17/2018 CONSULTATION DATE: 05/17/2018  REFERRING MD : Dr. Vianne Bulls   CHIEF COMPLAINT: Shortness of Breath   BRIEF PATIENT DESCRIPTION:  42 yo female admitted with acute on chronic hypoxic hypercapnic respiratory failure secondary to severe asthma exacerbation requiring mechanical intubation and epinephrine gtt   SIGNIFICANT EVENTS/STUDIES:  08/8 Pt admitted to ICU mechanically intubated   HISTORY OF PRESENT ILLNESS:   This is a 42 yo female with a PMH of Tendonitis, Migraine, IBS, GERD, Bursitis, Asthma, and Anxiety.  She presented to [facility] ER on 08/8 with c/o acute onset of shortness of breath symptoms started 45 minutes prior to presentation.  Per ER notes she took her inhalers to treat symptoms, however she had little relief prompting ER visit.  In the ER pt in severe respiratory distress with tachypnea and wheezes requiring mechanical intubation.  During mechanical intubation ER physician noted she had laryngeal edema. She received multiple duonebs, iv magnesium, iv solumedrol, 2 doses of IM epinephrine, and epinephrine gtt started.  She was evaluated by ENT in 2016 due to intermittent stridor, there was no evidence of vocal cord dysfunction or airway obstruction at that time.  She has been having choking episodes at night, snoring respirations, and sleepiness during the day according to pts husband.  She was subsequently admitted to ICU by hospitalist team for further workup and treatment.     PAST MEDICAL HISTORY :   has a past medical history of Anxiety attack, Asthma, Bursitis, Chronically dry eyes, GERD (gastroesophageal reflux disease), IBS (irritable bowel syndrome), Migraine, Multiple allergies, Sun allergy, and Tendonitis.  has a past surgical history that includes Tubal ligation; Breast reduction surgery; and Reduction mammaplasty (Bilateral). Prior to Admission medications   Medication Sig Start Date End  Date Taking? Authorizing Provider  albuterol (PROVENTIL HFA;VENTOLIN HFA) 108 (90 BASE) MCG/ACT inhaler Inhale 2 puffs into the lungs every 6 (six) hours as needed for wheezing or shortness of breath. 06/06/15   Janece Canterbury, MD  b complex vitamins tablet Take 1 tablet by mouth daily.    [provider]  beta carotene 25000 UNIT capsule Take 25,000 Units by mouth daily.    [provider]  Bilberry, Vaccinium myrtillus, (BILBERRY PO) Take 1 tablet by mouth daily.    [provider]  BIOTIN PO Take 1 tablet by mouth daily.    [provider]  Borage, Borago officinalis, (BORAGE OIL) 500 MG CAPS Take by mouth.    [provider]  Brewers Yeast TABS Take by mouth.    [provider]  Cholecalciferol (VITAMIN D-3 PO) Take 1 capsule by mouth daily.    [provider]  CINNAMON PO Take 1 tablet by mouth daily.    [provider]  clobetasol cream (TEMOVATE) 0.05 % as needed. 04/16/15   [provider]  Coenzyme Q10 (CO Q 10) 100 MG CAPS Take by mouth.    [provider]  Cranberry-Milk Thistle (LIVER & KIDNEY CLEANSER PO) Take by mouth.    [provider]  ECHINACEA PO Take 1 tablet by mouth daily.    [provider]  EVENING PRIMROSE OIL PO Take 1 tablet by mouth daily.    [provider]  ferrous sulfate 325 (65 FE) MG tablet Take 325 mg by mouth daily with breakfast.    [provider]  Fluocin-Hydroquinone-Tretinoin (TRI-LUMA EX) Apply topically.    [provider]  fluticasone (CUTIVATE)  0.05 % cream Apply topically 2 (two) times daily.    [provider]  fluticasone (FLONASE) 50 MCG/ACT nasal spray Place 1 spray into both nostrils at bedtime as needed.     [provider]  folic acid (FOLVITE) 1 MG tablet Take 1 mg by mouth daily.    [provider]  Ginkgo Biloba 120 MG TABS Take by mouth.    [provider]  Korean Panax  Ginseng 100 MG CAPS Take by mouth.    [provider]  Lecithin 1200 MG CAPS Take by mouth.    [provider]  loratadine (CLARITIN) 10 MG tablet Take 10 mg by mouth daily.    [provider]  Magnesium 250 MG TABS Take by mouth.    [provider]  Melatonin 10 MG TABS Take by mouth.    [provider]  milk thistle 175 MG tablet Take 175 mg by mouth daily.    [provider]  Misc Natural Products (COLON CLEANSER PO) Take by mouth.    [provider]  mometasone-formoterol (DULERA) 100-5 MCG/ACT AERO Inhale 2 puffs into the lungs 2 (two) times daily. Patient not taking: Reported on 12/04/2015 06/06/15   Janece Canterbury, MD  montelukast (SINGULAIR) 10 MG tablet Take 1 tablet (10 mg total) by mouth at bedtime. 06/06/15   Janece Canterbury, MD  Multiple Vitamins-Minerals (HAIR/SKIN/NAILS PO) Take by mouth.    [provider]  nystatin (MYCOSTATIN/NYSTOP) 100000 UNIT/GM POWD daily.    [provider]  Omega-3 Fatty Acids (FISH OIL PO) Take 1 capsule by mouth daily.    [provider]  ondansetron (ZOFRAN) 4 MG tablet Take 1 tablet (4 mg total) by mouth every 8 (eight) hours as needed for nausea or vomiting. 06/06/15   Janece Canterbury, MD  pantoprazole (PROTONIX) 40 MG tablet Take 1 tablet (40 mg total) by mouth 2 (two) times daily before a meal. 06/06/15   Janece Canterbury, MD  Potassium 99 MG TABS Take by mouth.    [provider]  Prenatal Vit-Fe Fumarate-FA (PRENATAL MULTIVITAMIN) TABS tablet Take 1 tablet by mouth daily at 12 noon.    [provider]  Red Yeast Rice Extract (RED YEAST RICE PO) Take 1 tablet by mouth daily.    [provider]  selenium 50 MCG TABS tablet Take 50 mcg by mouth daily.    [provider]  triamcinolone cream (KENALOG) 0.1 % Apply 1 application topically 2 (two) times daily.    [provider]  Turmeric Curcumin 500 MG CAPS Take by  mouth.    [provider]   Allergies  Allergen Reactions  . Tomato Anaphylaxis and Rash  . Chocolate   . Oysters [Shellfish Allergy] Hives  . Peanut Butter Flavor     Feels congested    FAMILY HISTORY:  family history includes Aneurysm in her unknown relative; Asthma in her mother; Breast cancer in her unknown relative; CAD in her unknown relative; CVA in her unknown relative; Sickle cell anemia in her unknown relative. SOCIAL HISTORY:  reports that she has never smoked. She has never used smokeless tobacco. She reports that she drinks alcohol. She reports that she does not use drugs.  REVIEW OF SYSTEMS:   Unable to assess pt intubated  SUBJECTIVE:  Unable to assess pt intubated   VITAL SIGNS: Temp:  [98.4 F (36.9 C)] 98.4 F (36.9 C) (08/08 1826) Pulse Rate:  [98-117] 117 (08/08 1915) Resp:  [20-26] 20 (08/08 1915)  BP: (121-155)/(66-77) 134/74 (08/08 1915) SpO2:  [94 %-100 %] 95 % (08/08 1915) FiO2 (%):  [50 %] 50 % (08/08 1850) Weight:  [138.3 kg] 138.3 kg (08/08 1827)  PHYSICAL EXAMINATION: General: acutely ill appearing female, in mild respiratory distress mechanically intubated  Neuro: sedated, bilateral pupils pinpoint and sluggish  HEENT: supple, unable to assess JVD due to large neck  Cardiovascular: sinus tach, no M/R/G Lungs: diminished throughout, faint stridor, tachypneic  Abdomen: +BS x4, obese, soft, non distended  Musculoskeletal: normal bulk and tone, no edema  Skin: intact no rashes or lesions present   Recent Labs  Lab 05/17/18 1913  NA 140  K 3.3*  CL 111  CO2 20*  BUN 8  CREATININE 0.81  GLUCOSE 154*   Recent Labs  Lab 05/17/18 1913  HGB 9.8*  HCT 29.6*  WBC 11.0  PLT 620*   Dg Abdomen 1 View  Result Date: 05/17/2018 CLINICAL DATA:  Gastric tube placement EXAM: ABDOMEN - 1 VIEW COMPARISON:  None. FINDINGS: AP supine view of the abdomen demonstrates the tip and side port of a gastric tube below the diaphragm along the  expected location of the greater curvature of the stomach. The tip projects over the right upper quadrant of the abdomen likely within distal stomach. Bowel gas pattern is unremarkable. There is no free air. IMPRESSION: Gastric tube in satisfactory position along the expected location of the stomach. Electronically Signed   By: Ashley Royalty M.D.   On: 05/17/2018 19:34   Dg Chest Portable 1 View  Result Date: 05/17/2018 CLINICAL DATA:  Hypoxia EXAM: PORTABLE CHEST 1 VIEW COMPARISON:  June 05, 2015 FINDINGS: Endotracheal tube tip is at the carina. No pneumothorax. There is no edema or consolidation. Heart is slightly enlarged with pulmonary vascularity normal. No adenopathy. No bone lesions. IMPRESSION: Endotracheal tube tip is at the carina. Advise withdrawing endotracheal tube approximately 3 cm. No edema or consolidation.  Stable cardiac prominence. Electronically Signed   By: Lowella Grip III M.D.   On: 05/17/2018 19:13    ASSESSMENT / PLAN: Acute on chronic hypoxic hypercapnic respiratory failure secondary to asthma exacerbation  Mechanical intubation  Hypokalemia  Anemia without signs of active bleeding  Hx: GERD, Tendonitis, IBS, and Anxiety  P: Full vent support for now-vent settings reviewed and established  SBT once all parameters met  IV and nebulized steroids IV benadryl Scheduled and prn bronchodilator therapy  Continue epinephrine gtt for now will wean as tolerated  VAP bundle implemented  I strongly suspect the pt has undiagnosed OSA she will need an outpatient sleep study when discharged and establish care with outpatient pulmonology  Continuous telemetry monitoring  NS _0  ml/hr Trend BMP  Replace electrolytes as indicated  Monitor UOP  VTE px: subq heparin  Trend CBC  Monitor for s/sx of bleeding and transfuse for hgb <7 SUP px: protonix  Keep NPO for now  Urine drug screen pending  Maintain RASS 0 to -1 Propofol and fentanyl gtts to maintain RASS goal and for  pain managaement WUA once all parameters met   Marda Stalker, [place] Pager [phone] (please enter 7 digits) PCCM Consult Pager [phone] (please enter 7 digits)

## 2018-05-17 NOTE — ED Provider Notes (Signed)
[facility] Emergency Department Provider Note  ____________________________________________   First MD Initiated Contact with Patient 05/17/18 1836     (approximate)  I have reviewed the triage vital signs and the nursing notes.   HISTORY  Chief Complaint Shortness of Breath  Level 5 exemption history limited by the patient's clinical condition  HPI Jasmine Schultz is a 42 y.o. female who self presents to the emergency department with severe shortness of breath.  The patient has a past medical history of severe asthma and according to her husband 1 hour ago she began to become extremely short of breath.  She has had no recent illness.  She was given 1 DuoNeb in triage with no improvement.  In the room the patient is in severe distress pointing at her throat unable to talk.    Past Medical History:  Diagnosis Date  . Anxiety attack   . Asthma    diagnosed in march 2019  . Bursitis   . Chronically dry eyes   . GERD (gastroesophageal reflux disease)   . IBS (irritable bowel syndrome)   . Migraine   . Multiple allergies   . Sun allergy   . Tendonitis     Patient Active Problem List   Diagnosis Date Noted  . Acute respiratory failure ([place]) 05/17/2018  . Acute respiratory failure with hypoxia ([place]) 06/06/2015  . Anemia, iron deficiency 06/06/2015  . Reactive airway disease 06/05/2015  . Dyspnea 06/05/2015  . Nausea and vomiting 06/05/2015  . Anxiety attack 06/05/2015  . Allergic rhinitis 06/05/2015    Past Surgical History:  Procedure Laterality Date  . BREAST REDUCTION SURGERY    . REDUCTION MAMMAPLASTY Bilateral    2002  . TUBAL LIGATION      Prior to Admission medications   Medication Sig Start Date End Date Taking? Authorizing Provider  albuterol (PROVENTIL HFA;VENTOLIN HFA) 108 (90 BASE) MCG/ACT inhaler Inhale 2 puffs into the lungs every 6 (six) hours as needed for wheezing or shortness of breath. 06/06/15  Yes Short, Noah Delaine, MD    B Complex Vitamins (B-COMPLEX/B-12) LIQD Place 1 mL under the tongue daily.   Yes [provider]  beta carotene 25000 UNIT capsule Take 25,000 Units by mouth daily.   Yes [provider]  Bilberry, Vaccinium myrtillus, (BILBERRY PO) Take 250 mg by mouth daily.    Yes [provider]  Biotin 5 MG TABS Take 5 mg by mouth daily.    Yes [provider]  Chong Sicilian, Borago officinalis, (BORAGE OIL) 500 MG CAPS Take 500 mg by mouth daily.    Yes [provider]  Brewers Yeast 500 MG TABS Take 2,000 mg by mouth daily.    Yes [provider]  Cholecalciferol (VITAMIN D-3) 1000 units CAPS Take 6,000 Units by mouth daily.    Yes [provider]  Cinnamon 500 MG capsule Take 1,000 mg by mouth daily.    Yes [provider]  clobetasol cream (TEMOVATE) 5.46 % Apply 1 application topically daily as needed.    Yes [provider]  Coenzyme Q10 (CO Q 10) 100 MG CAPS Take 100 mg by mouth daily.    Yes [provider]  Cranberry-Milk Thistle (LIVER & KIDNEY CLEANSER PO) Use 1 time weekly as directed as needed   Yes [provider]  Echinacea 380 MG CAPS Take 760 mg by mouth 2 (two) times daily.   Yes [provider]  Evening Primrose Oil 1000 MG CAPS Take 1,000  mg by mouth daily.   Yes [provider]  ferrous sulfate 325 (65 FE) MG tablet Take 325 mg by mouth daily.    Yes [provider]  Fluocin-Hydroquinone-Tretinoin (TRI-LUMA EX) Apply 1 application topically at bedtime.    Yes [provider]  fluticasone (CUTIVATE) 0.05 % cream Apply 1 application topically daily as needed (unknown indication).    Yes [provider]  fluticasone (FLONASE) 50 MCG/ACT nasal spray Place 1-2 sprays into both nostrils daily.    Yes [provider]  folic acid (FOLVITE) 888 MCG tablet Take 800 mg by mouth daily.    Yes [provider]  Ginkgo Biloba 120 MG TABS Take 120 mg  by mouth daily.    Yes [provider]  Korean Panax Ginseng 100 MG CAPS Take 100 mg by mouth daily.    Yes [provider]  Lecithin 1200 MG CAPS Take 1,200 mg by mouth daily.    Yes [provider]  loratadine (CLARITIN) 10 MG tablet Take 10 mg by mouth daily.   Yes [provider]  Magnesium 250 MG TABS Take 250 mg by mouth daily.    Yes [provider]  Melatonin 10 MG TABS Take 10 mg by mouth at bedtime as needed (sleep).    Yes [provider]  milk thistle 175 MG tablet Take 175 mg by mouth daily.   Yes [provider]  Misc Natural Products (COLON CLEANSER PO) Use 1 time weekly as directed as needed   Yes [provider]  montelukast (SINGULAIR) 10 MG tablet Take 1 tablet (10 mg total) by mouth at bedtime. 06/06/15  Yes Short, Noah Delaine, MD  Multiple Vitamins-Minerals (HAIR/SKIN/NAILS PO) Take 3 tablets by mouth daily.    Yes [provider]  nystatin (MYCOSTATIN/NYSTOP) 100000 UNIT/GM POWD Apply 1 g topically 2 (two) times daily.    Yes [provider]  Omega-3 Fatty Acids (FISH OIL) 1000 MG CAPS Take 1,000 mg by mouth daily.    Yes [provider]  ondansetron (ZOFRAN) 4 MG tablet Take 1 tablet (4 mg total) by mouth every 8 (eight) hours as needed for nausea or vomiting. 06/06/15  Yes Short, Noah Delaine, MD  pantoprazole (PROTONIX) 40 MG tablet Take 1 tablet (40 mg total) by mouth 2 (two) times daily before a meal. 06/06/15  Yes Short, Noah Delaine, MD  Potassium 99 MG TABS Take 99 mg by mouth daily.    Yes [provider]  Prenatal Vit-Fe Fumarate-FA (PRENATAL MULTIVITAMIN) TABS tablet Take 1 tablet by mouth daily.    Yes [provider]  Red Yeast Rice 600 MG CAPS Take 3,000 mg by mouth daily.    Yes [provider]  selenium 200 MCG TABS tablet Take 200 mcg by mouth daily.    Yes [provider]  triamcinolone cream (KENALOG) 0.1 % Apply 1 application topically  daily as needed (unknown indication).    Yes [provider]  Turmeric Curcumin 500 MG CAPS Take 500 mg by mouth daily.    Yes [provider]    Allergies Tomato; Chocolate; Oysters [shellfish allergy]; and Peanut butter flavor  Family History  Problem Relation Age of Onset  . Asthma Mother   . CAD Unknown   . CVA Unknown   . Aneurysm Unknown        brain aneurysm multiple family members  . Breast cancer Unknown        grandmother  . Sickle cell anemia Unknown  maternal cousin    Social History Social History   Tobacco Use  . Smoking status: Never Smoker  . Smokeless tobacco: Never Used  Substance Use Topics  . Alcohol use: Yes    Alcohol/week: 0.0 standard drinks    Comment: rarley  . Drug use: No    Review of Systems Level 5 exemption history limited by the patient's clinical condition  ____________________________________________   PHYSICAL EXAM:  VITAL SIGNS: ED Triage Vitals  Enc Vitals Group     BP 05/17/18 1826 121/66     Pulse Rate 05/17/18 1826 98     Resp 05/17/18 1826 (!) 26     Temp 05/17/18 1826 98.4 F (36.9 C)     Temp Source 05/17/18 1826 Oral     SpO2 05/17/18 1826 100 %     Weight 05/17/18 1827 (!) 305 lb (138.3 kg)     Height --      Head Circumference --      Peak Flow --      Pain Score 05/17/18 1826 0     Pain Loc --      Pain Edu? --      Excl. in [place]? --     Constitutional: The patient is in severe respiratory distress.  Unable to speak.  In extremis sitting up using accessory muscles Eyes: PERRL EOMI. Head: Atraumatic. Nose: No congestion/rhinnorhea. Mouth/Throat: No trismus Neck: Stridulous breath sounds Cardiovascular: Tachycardic rate, regular rhythm. Grossly normal heart sounds.  Good peripheral circulation. Respiratory: Severe respiratory distress using accessory muscles.  Lungs are extremely wheezy throughout Gastrointestinal: Morbidly obese soft nontender Musculoskeletal: No lower extremity  edema   Neurologic: Moving all 4 Skin: Diaphoretic Psychiatric: Very anxious appearing   ____________________________________________   DIFFERENTIAL includes but not limited to  Angioedema, anaphylaxis, asthma, paroxysmal vocal cord movement ____________________________________________   LABS (all labs ordered are listed, but only abnormal results are displayed)  Labs Reviewed  BASIC METABOLIC PANEL - Abnormal; Notable for the following components:      Result Value   Potassium 3.3 (*)    CO2 20 (*)    Glucose, Bld 154 (*)    Calcium 8.4 (*)    All other components within normal limits  CBC - Abnormal; Notable for the following components:   RBC 3.66 (*)    Hemoglobin 9.8 (*)    HCT 29.6 (*)    RDW 15.9 (*)    Platelets 620 (*)    All other components within normal limits  BLOOD GAS, ARTERIAL - Abnormal; Notable for the following components:   pH, Arterial 7.20 (*)    pCO2 arterial 59 (*)    Acid-base deficit 5.1 (*)    All other components within normal limits  CBC - Abnormal; Notable for the following components:   WBC 19.7 (*)    RBC 3.65 (*)    Hemoglobin 9.4 (*)    HCT 29.6 (*)    MCH 25.7 (*)    MCHC 31.7 (*)    RDW 15.6 (*)    Platelets 638 (*)    All other components within normal limits  GLUCOSE, CAPILLARY - Abnormal; Notable for the following components:   Glucose-Capillary 260 (*)    All other components within normal limits  MRSA PCR SCREENING  TROPONIN I  CREATININE, SERUM  TRIGLYCERIDES  HCG, QUANTITATIVE, PREGNANCY  HIV ANTIBODY (ROUTINE TESTING)  BASIC METABOLIC PANEL  CBC  URINALYSIS, COMPLETE (UACMP) WITH MICROSCOPIC  URINE DRUG SCREEN, QUALITATIVE ([facility] ONLY)  BLOOD  GAS, ARTERIAL    Lab work reviewed by me with chronic anemia otherwise unremarkable __________________________________________  EKG  ED ECG REPORT I, Darel Hong, the attending physician, personally viewed and interpreted this ECG.  Date: 05/17/2018 EKG Time:    Rate: 119 Rhythm: Sinus tachycardia QRS Axis: normal Intervals: normal ST/T Wave abnormalities: normal Narrative Interpretation: no evidence of acute ischemia  ____________________________________________  RADIOLOGY  Chest x-ray reviewed by me with endotracheal tube at the carina Abdominal x-ray reviewed by me with oral pharyngeal tube in adequate position ____________________________________________   PROCEDURES  Procedure(s) performed: Yes  .Critical Care Performed by: Darel Hong, MD Authorized by: Darel Hong, MD   Critical care provider statement:    Critical care time (minutes):  65   Critical care time was exclusive of:  Separately billable procedures and treating other patients   Critical care was necessary to treat or prevent imminent or life-threatening deterioration of the following conditions:  Respiratory failure   Critical care was time spent personally by me on the following activities:  Development of treatment plan with patient or surrogate, discussions with consultants, evaluation of patient's response to treatment, examination of patient, obtaining history from patient or surrogate, ordering and performing treatments and interventions, ordering and review of laboratory studies, ordering and review of radiographic studies, pulse oximetry, re-evaluation of patient's condition and review of old charts Procedure Name: Intubation Date/Time: 05/17/2018 9:41 PM Performed by: Darel Hong, MD Pre-anesthesia Checklist: Patient identified, Patient being monitored, Emergency Drugs available, Timeout performed and Suction available Oxygen Delivery Method: Non-rebreather mask Preoxygenation: Pre-oxygenation with 100% oxygen Induction Type: Rapid sequence Ventilation: Two handed mask ventilation required Laryngoscope Size: Mac and 4 Grade View: Grade I Tube size: 7.0 mm Number of attempts: 1 Placement Confirmation: ETT inserted through vocal cords under direct  vision,  CO2 detector and Breath sounds checked- equal and bilateral Secured at: 22 cm Tube secured with: ETT holder       Critical Care performed: Yes  ____________________________________________   INITIAL IMPRESSION / ASSESSMENT AND PLAN / ED COURSE  Pertinent labs & imaging results that were available during my care of the patient were reviewed by me and considered in my medical decision making (see chart for details).   As part of my medical decision making, I reviewed the following data within the [place] History obtained from family if available, nursing notes, old chart and ekg, as well as notes from prior ED visits.  The patient arrived in the emergency department critically ill-appearing.  She had very wheezy lungs and was unable to speak with a hoarse voice pointing at her throat crying.  According to her husband she has a past medical history of severe asthma as well as multiple allergies.  She was in her usual state of health until 1 hour prior to arrival when she became acutely short of breath.  When I asked the patient if she feels like her throat is swelling up she is crying and nodding.  I have a high clinical suspicion for anaphylaxis so epinephrine autoinjector given x2 and I mixed up an epinephrine drip by putting 1 mg of crash cart epinephrine into a liter of normal saline and ran it through her 20-gauge IV.  The patient did not immediately turn around so decision was made to intubate with the patient and family's agreement.  Intubated with 200 mg of ketamine and 200 mg of succinylcholine on first pass without complication.  We have continued the patient on an epinephrine  drip, Solu-Medrol, duo nebs, and she will require inpatient admission for management of her anaphylaxis versus severe asthma.  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  The patient's  husband and mother were updated and brought into the room throughout the course of the resuscitation.      ____________________________________________   FINAL CLINICAL IMPRESSION(S) / ED DIAGNOSES  Final diagnoses:  Anaphylaxis, initial encounter  Acute respiratory failure, unspecified whether with hypoxia or hypercapnia ([place])      NEW MEDICATIONS STARTED DURING THIS VISIT:  Current Discharge Medication List       Note:  This document was prepared using Dragon voice recognition software and may include unintentional dictation errors.     Darel Hong, MD 05/17/18 2147

## 2018-05-17 NOTE — ED Notes (Addendum)
200 of ketamine and 200 of succinylcholine given by RN Janett Billow. See [facility]

## 2018-05-17 NOTE — ED Notes (Signed)
Pt intubated by MD Rifenbark

## 2018-05-17 NOTE — ED Triage Notes (Signed)
Patient was at home sleeping and awoke with SOB X45 minutes ago. Hx asthma. Patient tried inhaler at home with little relief

## 2018-05-17 NOTE — ED Notes (Signed)
Epinephrine drip changed to 88mcg at this time per Dr. Mable Paris

## 2018-05-17 NOTE — ED Notes (Signed)
EPI drip that was mixed by MD Rifenbark d/c.Pt received 242ml prior to stopping.

## 2018-05-17 NOTE — ED Notes (Addendum)
Epi drip mixed 7 hung by MD rifenbark 1mg  of epi in 1000mg  NS

## 2018-05-17 NOTE — Progress Notes (Signed)
ABG results notified to [place]  .

## 2018-05-18 ENCOUNTER — Encounter: Payer: Self-pay | Admitting: *Deleted

## 2018-05-18 ENCOUNTER — Inpatient Hospital Stay: Payer: Managed Care, Other (non HMO)

## 2018-05-18 DIAGNOSIS — J9600 Acute respiratory failure, unspecified whether with hypoxia or hypercapnia: Secondary | ICD-10-CM

## 2018-05-18 LAB — BLOOD GAS, ARTERIAL
ACID-BASE DEFICIT: 12.8 mmol/L — AB (ref 0.0–2.0)
Acid-base deficit: 6.9 mmol/L — ABNORMAL HIGH (ref 0.0–2.0)
Bicarbonate: 15 mmol/L — ABNORMAL LOW (ref 20.0–28.0)
Bicarbonate: 19.2 mmol/L — ABNORMAL LOW (ref 20.0–28.0)
FIO2: 0.3
FIO2: 30
MECHVT: 450 mL
Mechanical Rate: 26
Mechanical Rate: 26
O2 Saturation: 93.4 %
O2 Saturation: 95.1 %
PCO2 ART: 40 mmHg (ref 32.0–48.0)
PCO2 ART: 41 mmHg (ref 32.0–48.0)
PEEP: 5 cmH2O
PEEP: 5 cmH2O
PH ART: 7.17 — AB (ref 7.350–7.450)
PO2 ART: 86 mmHg (ref 83.0–108.0)
Patient temperature: 37
Patient temperature: 37
VT: 450 mL
pH, Arterial: 7.29 — ABNORMAL LOW (ref 7.350–7.450)
pO2, Arterial: 85 mmHg (ref 83.0–108.0)

## 2018-05-18 LAB — PHOSPHORUS
PHOSPHORUS: 2 mg/dL — AB (ref 2.5–4.6)
Phosphorus: 1.9 mg/dL — ABNORMAL LOW (ref 2.5–4.6)

## 2018-05-18 LAB — BASIC METABOLIC PANEL
ANION GAP: 13 (ref 5–15)
ANION GAP: 6 (ref 5–15)
ANION GAP: 7 (ref 5–15)
BUN: 9 mg/dL (ref 6–20)
BUN: 11 mg/dL (ref 6–20)
BUN: 12 mg/dL (ref 6–20)
CALCIUM: 8.6 mg/dL — AB (ref 8.9–10.3)
CHLORIDE: 109 mmol/L (ref 98–111)
CHLORIDE: 108 mmol/L (ref 98–111)
CO2: 20 mmol/L — ABNORMAL LOW (ref 22–32)
CO2: 24 mmol/L (ref 22–32)
CO2: 26 mmol/L (ref 22–32)
Calcium: 8.1 mg/dL — ABNORMAL LOW (ref 8.9–10.3)
Calcium: 8.3 mg/dL — ABNORMAL LOW (ref 8.9–10.3)
Chloride: 108 mmol/L (ref 98–111)
Creatinine, Ser: 1.03 mg/dL — ABNORMAL HIGH (ref 0.44–1.00)
Creatinine, Ser: 0.93 mg/dL (ref 0.44–1.00)
Creatinine, Ser: 0.82 mg/dL (ref 0.44–1.00)
Glucose, Bld: 208 mg/dL — ABNORMAL HIGH (ref 70–99)
Glucose, Bld: 123 mg/dL — ABNORMAL HIGH (ref 70–99)
Glucose, Bld: 128 mg/dL — ABNORMAL HIGH (ref 70–99)
POTASSIUM: 4.1 mmol/L (ref 3.5–5.1)
POTASSIUM: 4.1 mmol/L (ref 3.5–5.1)
Potassium: 3.5 mmol/L (ref 3.5–5.1)
SODIUM: 141 mmol/L (ref 135–145)
Sodium: 141 mmol/L (ref 135–145)
Sodium: 139 mmol/L (ref 135–145)

## 2018-05-18 LAB — CBC
HCT: 31.3 % — ABNORMAL LOW (ref 35.0–47.0)
HEMOGLOBIN: 10.1 g/dL — AB (ref 12.0–16.0)
MCH: 26.5 pg (ref 26.0–34.0)
MCHC: 32.2 g/dL (ref 32.0–36.0)
MCV: 82.3 fL (ref 80.0–100.0)
Platelets: 599 10*3/uL — ABNORMAL HIGH (ref 150–440)
RBC: 3.8 MIL/uL (ref 3.80–5.20)
RDW: 16.2 % — ABNORMAL HIGH (ref 11.5–14.5)
WBC: 16.7 10*3/uL — ABNORMAL HIGH (ref 3.6–11.0)

## 2018-05-18 LAB — GLUCOSE, CAPILLARY
GLUCOSE-CAPILLARY: 189 mg/dL — AB (ref 70–99)
GLUCOSE-CAPILLARY: 133 mg/dL — AB (ref 70–99)
Glucose-Capillary: 128 mg/dL — ABNORMAL HIGH (ref 70–99)
Glucose-Capillary: 117 mg/dL — ABNORMAL HIGH (ref 70–99)
Glucose-Capillary: 131 mg/dL — ABNORMAL HIGH (ref 70–99)

## 2018-05-18 LAB — MAGNESIUM
MAGNESIUM: 2.2 mg/dL (ref 1.7–2.4)
MAGNESIUM: 2.2 mg/dL (ref 1.7–2.4)
Magnesium: 2.1 mg/dL (ref 1.7–2.4)

## 2018-05-18 LAB — HEMOGLOBIN A1C
Hgb A1c MFr Bld: 5.5 % (ref 4.8–5.6)
MEAN PLASMA GLUCOSE: 111.15 mg/dL

## 2018-05-18 LAB — PROCALCITONIN: Procalcitonin: 2.68 ng/mL

## 2018-05-18 MED ORDER — FAMOTIDINE IN NACL 20-0.9 MG/50ML-% IV SOLN
20.0000 mg | Freq: Every day | INTRAVENOUS | Status: DC
  Administered 2018-05-18 – 2018-05-21 (×4): 20 mg via INTRAVENOUS
  Filled 2018-05-18 (×4): qty 50

## 2018-05-18 MED ORDER — PNEUMOCOCCAL VAC POLYVALENT 25 MCG/0.5ML IJ INJ: 0.5000 mL | INJECTION | INTRAMUSCULAR | Status: DC

## 2018-05-18 MED ORDER — PRO-STAT SUGAR FREE PO LIQD
30.0000 mL | Freq: Two times a day (BID) | ORAL | Status: DC
  Administered 2018-05-18: 30 mL

## 2018-05-18 MED ORDER — VECURONIUM BROMIDE 10 MG IV SOLR
10.0000 mg | Freq: Once | INTRAVENOUS | Status: AC
  Administered 2018-05-18: 10 mg via INTRAVENOUS
  Filled 2018-05-18: qty 10

## 2018-05-18 MED ORDER — SODIUM CHLORIDE 0.9 % IV SOLN
500.0000 mg | INTRAVENOUS | Status: DC
  Administered 2018-05-18 – 2018-05-21 (×4): 500 mg via INTRAVENOUS
  Filled 2018-05-18 (×5): qty 500

## 2018-05-18 MED ORDER — PIPERACILLIN-TAZOBACTAM 3.375 G IVPB
3.3750 g | Freq: Three times a day (TID) | INTRAVENOUS | Status: DC
  Administered 2018-05-18 – 2018-05-21 (×10): 3.375 g via INTRAVENOUS
  Filled 2018-05-18 (×10): qty 50

## 2018-05-18 MED ORDER — SODIUM CHLORIDE 0.9 % IV SOLN
0.5000 mg/kg/h | INTRAVENOUS | Status: DC
  Filled 2018-05-18: qty 5

## 2018-05-18 MED ORDER — SODIUM CHLORIDE 0.9 % IV SOLN
100.0000 mg | Freq: Two times a day (BID) | INTRAVENOUS | Status: DC
  Filled 2018-05-18 (×2): qty 100

## 2018-05-18 MED ORDER — VITAL HIGH PROTEIN PO LIQD
1000.0000 mL | ORAL | Status: DC
  Administered 2018-05-18: 1000 mL

## 2018-05-18 MED ORDER — PRO-STAT SUGAR FREE PO LIQD
60.0000 mL | Freq: Every day | ORAL | Status: DC
  Administered 2018-05-18 – 2018-05-20 (×10): 60 mL

## 2018-05-18 MED ORDER — STERILE WATER FOR INJECTION IJ SOLN
INTRAMUSCULAR | Status: AC
  Administered 2018-05-18: 10 mL
  Filled 2018-05-18: qty 10

## 2018-05-18 MED ORDER — SODIUM CHLORIDE 0.9 % IV SOLN
1.0000 mg/kg/h | INTRAVENOUS | Status: DC
  Administered 2018-05-18 (×2): 0.5 mg/kg/h via INTRAVENOUS
  Administered 2018-05-19 (×2): 1 mg/kg/h via INTRAVENOUS
  Administered 2018-05-19: 0.5 mg/kg/h via INTRAVENOUS
  Filled 2018-05-18 (×5): qty 50

## 2018-05-18 MED ORDER — STERILE WATER FOR INJECTION IV SOLN
INTRAVENOUS | Status: DC
  Administered 2018-05-18 (×2): via INTRAVENOUS
  Filled 2018-05-18 (×2): qty 850

## 2018-05-18 MED ORDER — POTASSIUM CHLORIDE 10 MEQ/100ML IV SOLN
10.0000 meq | INTRAVENOUS | Status: AC
  Administered 2018-05-18 (×2): 10 meq via INTRAVENOUS
  Filled 2018-05-18 (×2): qty 100

## 2018-05-18 MED ORDER — ALPRAZOLAM 0.25 MG PO TABS
0.2500 mg | ORAL_TABLET | Freq: Two times a day (BID) | ORAL | Status: DC
  Administered 2018-05-18 (×2): 0.25 mg via ORAL
  Filled 2018-05-18 (×2): qty 1

## 2018-05-18 MED ORDER — VITAL HIGH PROTEIN PO LIQD
1000.0000 mL | ORAL | Status: DC
  Administered 2018-05-18 – 2018-05-19 (×2): 1000 mL
  Administered 2018-05-20: 07:00:00

## 2018-05-18 MED ORDER — SODIUM BICARBONATE 8.4 % IV SOLN
150.0000 meq | Freq: Once | INTRAVENOUS | Status: AC
  Administered 2018-05-18: 150 meq via INTRAVENOUS

## 2018-05-18 MED FILL — Medication: Qty: 1 | Status: AC

## 2018-05-18 NOTE — Care Management (Signed)
Patient is currently intubated; anticipate weaning trail today.

## 2018-05-18 NOTE — Progress Notes (Signed)
WUA performed at bedside this morning. Patient with increasing anxiety and agitation.  Dr. Soyla Murphy to the bedside to evaluate patient.  MD stated to restart sedation as patient did not sound like she was moving air sufficiently.  New  Orders placed for ketamine drip for bronchodilation and sedation.  Will continue to assess and monitor closely.

## 2018-05-18 NOTE — Progress Notes (Signed)
Initial Nutrition Assessment  DOCUMENTATION CODES:   Morbid obesity  INTERVENTION:  Switch to Vital High Protein at 59mL/hr Pro-stat 51mL 5 times daily With propofol at 41.15mL/hr (1096 calories) regimen provides:  2216 calories (112% estimated needs) 161 grams of protein 121mL free water  NUTRITION DIAGNOSIS:   Inadequate oral intake related to inability to eat as evidenced by NPO status.  GOAL:   Provide needs based on ASPEN/SCCM guidelines  MONITOR:   TF tolerance, I & O's, Labs, Weight trends  REASON FOR ASSESSMENT:   Consult Enteral/tube feeding initiation and management  ASSESSMENT:   42 yo female admitted with acute on chronic hypoxic hypercapnic respiratory failure secondary to severe asthma exacerbation requiring mechanical intubation and epinephrine gtt   Spoke with patient's husband at bedside. He reports good PO PTA, declines any weight loss. Discussed in rounds. Plan to decrease propofol. Will monitor  Patient is currently intubated on ventilator support MV: 11.7 L/min Temp (24hrs), Avg:99.2 F (37.3 C), Min:98.1 F (36.7 C), Max:100.6 F (38.1 C)  Propofol: 41.5 ml/hr --> 1096 calories  Medications reviewed and include:  Insulin, Solumedrol Ketamine gtt Fentanyl gtt NAHCO3 at 61mL/hr  Labs reviewed:  CBGs 128, 189   Intake/Output Summary (Last 24 hours) at 05/18/2018 1323 Last data filed at 05/18/2018 1200 Gross per 24 hour  Intake 2573.63 ml  Output 2585 ml  Net -11.37 ml  1430mL UOP  MAP: 64-85    NUTRITION - FOCUSED PHYSICAL EXAM:    Most Recent Value  Orbital Region  No depletion  Upper Arm Region  No depletion  Thoracic and Lumbar Region  No depletion  Buccal Region  No depletion  Temple Region  No depletion  Clavicle Bone Region  No depletion  Clavicle and Acromion Bone Region  No depletion  Scapular Bone Region  No depletion  Dorsal Hand  No depletion  Patellar Region  No depletion  Anterior Thigh Region  No depletion   Posterior Calf Region  No depletion  Edema (RD Assessment)  None  Hair  Reviewed  Skin  Reviewed  Nails  Reviewed       Diet Order:   Diet Order            Diet NPO time specified  Diet effective now              EDUCATION NEEDS:   No education needs have been identified at this time  Skin:  Skin Assessment: Reviewed RN Assessment  Last BM:  PTA  Height:   Ht Readings from Last 1 Encounters:  05/17/18 5\' 8"  (1.727 m)    Weight:   Wt Readings from Last 1 Encounters:  05/18/18 (!) 141.6 kg    Ideal Body Weight:  63.63 kg  BMI:  Body mass index is 47.47 kg/m.  Estimated Nutritional Needs:   Kcal:  1550 - 1973 calories (11-14 cal/kg)  Protein:  >/= 159 grams  Fluid:  >1.5L    Jasmine Anis. Emaley Applin, MS, RD LDN Inpatient Clinical Dietitian Pager [phone]

## 2018-05-18 NOTE — Progress Notes (Signed)
Name: Jasmine Schultz MRN: [id number] DOB: [birth year]     CONSULTATION DATE: 05/17/2018  Subjective & objective: Patient remains on vent, low-grade fever T-max 100.6, panic attacks with tapering off sedation.   PAST MEDICAL HISTORY :   has a past medical history of Anxiety attack, Asthma, Bursitis, Chronically dry eyes, GERD (gastroesophageal reflux disease), IBS (irritable bowel syndrome), Migraine, Multiple allergies, Sun allergy, and Tendonitis.  has a past surgical history that includes Tubal ligation; Breast reduction surgery; and Reduction mammaplasty (Bilateral). Prior to Admission medications   Medication Sig Start Date End Date Taking? Authorizing Provider  albuterol (PROVENTIL HFA;VENTOLIN HFA) 108 (90 BASE) MCG/ACT inhaler Inhale 2 puffs into the lungs every 6 (six) hours as needed for wheezing or shortness of breath. 06/06/15  Yes Short, Noah Delaine, MD  B Complex Vitamins (B-COMPLEX/B-12) LIQD Place 1 mL under the tongue daily.   Yes [provider]  beta carotene 25000 UNIT capsule Take 25,000 Units by mouth daily.   Yes [provider]  Bilberry, Vaccinium myrtillus, (BILBERRY PO) Take 250 mg by mouth daily.    Yes [provider]  Biotin 5 MG TABS Take 5 mg by mouth daily.    Yes [provider]  Chong Sicilian, Borago officinalis, (BORAGE OIL) 500 MG CAPS Take 500 mg by mouth daily.    Yes [provider]  Brewers Yeast 500 MG TABS Take 2,000 mg by mouth daily.    Yes [provider]  Cholecalciferol (VITAMIN D-3) 1000 units CAPS Take 6,000 Units by mouth daily.    Yes [provider]  Cinnamon 500 MG capsule Take 1,000 mg by mouth daily.    Yes [provider]  clobetasol cream (TEMOVATE) 4.40 % Apply 1 application topically daily as needed.    Yes [provider]  Coenzyme Q10 (CO Q 10) 100 MG CAPS Take 100 mg by mouth daily.    Yes [provider]  Cranberry-Milk Thistle (LIVER & KIDNEY  CLEANSER PO) Use 1 time weekly as directed as needed   Yes [provider]  Echinacea 380 MG CAPS Take 760 mg by mouth 2 (two) times daily.   Yes [provider]  Evening Primrose Oil 1000 MG CAPS Take 1,000 mg by mouth daily.   Yes [provider]  ferrous sulfate 325 (65 FE) MG tablet Take 325 mg by mouth daily.    Yes [provider]  Fluocin-Hydroquinone-Tretinoin (TRI-LUMA EX) Apply 1 application topically at bedtime.    Yes [provider]  fluticasone (CUTIVATE) 0.05 % cream Apply 1 application topically daily as needed (unknown indication).    Yes [provider]  fluticasone (FLONASE) 50 MCG/ACT nasal spray Place 1-2 sprays into both nostrils daily.    Yes [provider]  folic acid (FOLVITE) 347 MCG tablet Take 800 mg by mouth daily.    Yes [provider]  Ginkgo Biloba 120 MG TABS Take 120 mg by mouth daily.    Yes [provider]  Korean Panax Ginseng 100 MG CAPS Take 100 mg by mouth daily.    Yes [provider]  Lecithin 1200 MG CAPS Take 1,200 mg by mouth daily.    Yes [provider]  loratadine (CLARITIN) 10 MG tablet Take 10 mg by mouth daily.   Yes [provider]  Magnesium 250 MG TABS Take 250 mg by mouth daily.    Yes [provider]  Melatonin 10 MG TABS Take 10 mg by mouth at bedtime as  needed (sleep).    Yes [provider]  milk thistle 175 MG tablet Take 175 mg by mouth daily.   Yes [provider]  Misc Natural Products (COLON CLEANSER PO) Use 1 time weekly as directed as needed   Yes [provider]  montelukast (SINGULAIR) 10 MG tablet Take 1 tablet (10 mg total) by mouth at bedtime. 06/06/15  Yes Short, Noah Delaine, MD  Multiple Vitamins-Minerals (HAIR/SKIN/NAILS PO) Take 3 tablets by mouth daily.    Yes [provider]  nystatin (MYCOSTATIN/NYSTOP) 100000 UNIT/GM POWD Apply 1 g topically 2 (two) times daily.    Yes  [provider]  Omega-3 Fatty Acids (FISH OIL) 1000 MG CAPS Take 1,000 mg by mouth daily.    Yes [provider]  ondansetron (ZOFRAN) 4 MG tablet Take 1 tablet (4 mg total) by mouth every 8 (eight) hours as needed for nausea or vomiting. 06/06/15  Yes Short, Noah Delaine, MD  pantoprazole (PROTONIX) 40 MG tablet Take 1 tablet (40 mg total) by mouth 2 (two) times daily before a meal. 06/06/15  Yes Short, Noah Delaine, MD  Potassium 99 MG TABS Take 99 mg by mouth daily.    Yes [provider]  Prenatal Vit-Fe Fumarate-FA (PRENATAL MULTIVITAMIN) TABS tablet Take 1 tablet by mouth daily.    Yes [provider]  Red Yeast Rice 600 MG CAPS Take 3,000 mg by mouth daily.    Yes [provider]  selenium 200 MCG TABS tablet Take 200 mcg by mouth daily.    Yes [provider]  triamcinolone cream (KENALOG) 0.1 % Apply 1 application topically daily as needed (unknown indication).    Yes [provider]  Turmeric Curcumin 500 MG CAPS Take 500 mg by mouth daily.    Yes [provider]   Allergies  Allergen Reactions  . Tomato Anaphylaxis and Rash  . Chocolate   . Oysters [Shellfish Allergy] Hives  . Peanut Butter Flavor     Feels congested    FAMILY HISTORY:  family history includes Aneurysm in her unknown relative; Asthma in her mother; Breast cancer in her unknown relative; CAD in her unknown relative; CVA in her unknown relative; Sickle cell anemia in her unknown relative. SOCIAL HISTORY:  reports that she has never smoked. She has never used smokeless tobacco. She reports that she drinks alcohol. She reports that she does not use drugs.  REVIEW OF SYSTEMS:   Unable to obtain due to critical illness   VITAL SIGNS: Temp:  [98.1 F (36.7 C)-100.6 F (38.1 C)] 99.8 F (37.7 C) (08/09 1200) Pulse Rate:  [91-120] 99 (08/09 1230) Resp:  [16-34] 26 (08/09 1230) BP: (102-155)/(45-84) 109/52 (08/09 1230) SpO2:  [94 %-100 %] 99 %  (08/09 1230) FiO2 (%):  [30 %-50 %] 30 % (08/09 1200) Weight:  [138.3 kg-141.6 kg] 141.6 kg (08/09 0537)  Physical Examination:  Sedated with propofol and fentanyl to RASS -4, restlessness and panicking with tapering of sedation. On vent, severe respiratory distress when awake no air movements S1 & S2 are audible with no murmur Benign abdominal exam with normal peristalsis No peripheral edema   ASSESSMENT / PLAN: Acute respiratory failure.  Panicking with titration of sedation. -Optimize sedation with ketamine + propofol + fentanyl, start on Xanax and continue with ventilator support.  Acute exacerbation of bronchial asthma was severe airway disease. -Optimize bronchodilators, ketamine, inhaled steroids, tapering systemic steroids. -Monitor minute ventilation and PIP.  Pneumonia was possible aspiration.  Bibasilar airspace disease -Empiric Zosyn +  Zithromax.  Monitor CXR + CBC + FiO2.  Sepsis with procalcitonin 2.68.  MRSA PCR negative -Empiric Zosyn, monitor procalcitonin, lactic acid and cultures.  AKI -Optimize hydration, avoid nephrotoxins, monitor renal panel and urine output.  Metabolic acidosis on bicarb drip -Monitor renal panel, pH and taper bicarb drip off as tolerated.  Anemia -Keep Hb more than 7 g/dL.  Reactive thrombocytosis -Monitor platelets  Anxiety with panic attack. -Optimize Xanax  Full code  DVT + GI prophylaxis.  Continue with supportive care.  Critical care time 40 minutes.

## 2018-05-18 NOTE — Progress Notes (Signed)
[place] [facility] [place] NAME: Jasmine Schultz    MR#:  [id number]  DATE OF BIRTH:  06-01-1976  SUBJECTIVE:   Patient intubated sedated  Started on ketamine  REVIEW OF SYSTEMS:    Unable to obtain   Tolerating Diet: On tube feeds      DRUG ALLERGIES:   Allergies  Allergen Reactions  . Tomato Anaphylaxis and Rash  . Chocolate   . Oysters [Shellfish Allergy] Hives  . Peanut Butter Flavor     Feels congested    VITALS:  Blood pressure (!) 106/51, pulse (!) 103, temperature 99.9 F (37.7 C), temperature source Axillary, resp. rate (!) 26, height 5\' 8"  (1.727 m), weight (!) 141.6 kg, SpO2 99 %.  PHYSICAL EXAMINATION:  Constitutional: Appears well-developed and well-nourished. No distress. HENT: Normocephalic. .   Eyes: Conjunctivae are normal.no scleral icterus.  Neck: Normal ROM. Neck supple. No JVD. No tracheal deviation. CVS: RRR, S1/S2 +, no murmurs, no gallops, no carotid bruit.  Pulmonary: Effort and breath sounds normal, no stridor, rhonchi, wheezes, rales.  Abdominal: Soft. BS +,  no distension, tenderness, rebound or guarding.  Musculoskeletal:  No edema and no tenderness.  Neuro: sedated on vent Skin: Skin is warm and dry. No rash noted. Psychiatric: Sedated .      LABORATORY PANEL:   CBC Recent Labs  Lab 05/18/18 0425  WBC 16.7*  HGB 10.1*  HCT 31.3*  PLT 599*   ------------------------------------------------------------------------------------------------------------------  Chemistries  Recent Labs  Lab 05/18/18 0425  NA 141  K 3.5  CL 108  CO2 20*  GLUCOSE 208*  BUN 9  CREATININE 1.03*  CALCIUM 8.6*  MG 2.2   ------------------------------------------------------------------------------------------------------------------  Cardiac Enzymes Recent Labs  Lab 05/17/18 1913  TROPONINI <0.03    ------------------------------------------------------------------------------------------------------------------  RADIOLOGY:  Dg Abdomen 1 View  Result Date: 05/17/2018 CLINICAL DATA:  Gastric tube placement EXAM: ABDOMEN - 1 VIEW COMPARISON:  None. FINDINGS: AP supine view of the abdomen demonstrates the tip and side port of a gastric tube below the diaphragm along the expected location of the greater curvature of the stomach. The tip projects over the right upper quadrant of the abdomen likely within distal stomach. Bowel gas pattern is unremarkable. There is no free air. IMPRESSION: Gastric tube in satisfactory position along the expected location of the stomach. Electronically Signed   By: Ashley Royalty M.D.   On: 05/17/2018 19:34   Dg Chest Port 1 View  Result Date: 05/18/2018 CLINICAL DATA:  Hypoxia EXAM: PORTABLE CHEST 1 VIEW COMPARISON:  May 17, 2018 FINDINGS: Endotracheal tube tip is 2.3 cm above the carina. Nasogastric tube tip and side port are below the diaphragm. No pneumothorax. There is atelectatic change in the left base with small left pleural effusion. The lungs elsewhere clear. Heart is upper normal in size with pulmonary vascularity normal. No adenopathy. No bone lesions. IMPRESSION: Positions as described without pneumothorax. Left base atelectasis with small left pleural effusion. Right lung clear. Stable cardiac silhouette. Electronically Signed   By: Lowella Grip III M.D.   On: 05/18/2018 10:49   Dg Chest Portable 1 View  Result Date: 05/17/2018 CLINICAL DATA:  Hypoxia EXAM: PORTABLE CHEST 1 VIEW COMPARISON:  June 05, 2015 FINDINGS: Endotracheal tube tip is at the carina. No pneumothorax. There is no edema or consolidation. Heart is slightly enlarged with pulmonary vascularity normal. No adenopathy. No bone lesions. IMPRESSION: Endotracheal tube tip is at the carina. Advise withdrawing endotracheal tube approximately 3 cm.  No edema or consolidation.  Stable cardiac  prominence. Electronically Signed   By: Lowella Grip III M.D.   On: 05/17/2018 19:13     ASSESSMENT AND PLAN:    42 year old female with a history of asthma who presented to the ER due to shortness of breath.  1.  Acute hypoxic respiratory failure with laryngeal edema/asthma exacerbation: Patient is currently intubated and sedated on vent Continue management as per intensivist.   2.  Acute exacerbation of asthma: Continue steroids and nebs    Management plans discussed with nursing CODE STATUS: full  TOTAL TIME TAKING CARE OF THIS PATIENT: 27 minutes.     POSSIBLE D/C 2-3 days, DEPENDING ON CLINICAL CONDITION.   Emina Ribaudo M.D on 05/18/2018 at 11:48 AM  Between 7am to 6pm - Pager - [phone] After 6pm go to [email or web address] - password [email or web address] [facility]  Office  [phone]  CC: Primary care physician; Glendale Chard, MD  Note: This dictation was prepared with Dragon dictation along with smaller phrase technology. Any transcriptional errors that result from this process are unintentional.

## 2018-05-18 NOTE — Consult Note (Signed)
Pharmacy Antibiotic Note  Jasmine Schultz is a 42 y.o. female admitted on 05/17/2018 with asthma exacerbation and possible pneumonia.  Pharmacy has been consulted for Zosyn dosing.  Plan: Start Zosyn 3.375g IV q8h (4 hour infusion).   Patient is also receiving Azithromycin 500mg  IV daily. Recommend treatment duration is 3 days for azithromycin 500mg .   Height: 5\' 8"  (172.7 cm) Weight: (!) 312 lb 2.7 oz (141.6 kg) IBW/kg (Calculated) : 63.9  Temp (24hrs), Avg:98.9 F (37.2 C), Min:98.1 F (36.7 C), Max:100.6 F (38.1 C)  Recent Labs  Lab 05/17/18 1913 05/17/18 2105 05/18/18 0425  WBC 11.0 19.7* 16.7*  CREATININE 0.81 1.00 1.03*    Estimated Creatinine Clearance: 105.6 mL/min (A) (by C-G formula based on SCr of 1.03 mg/dL (H)).    Allergies  Allergen Reactions  . Tomato Anaphylaxis and Rash  . Chocolate   . Oysters [Shellfish Allergy] Hives  . Peanut Butter Flavor     Feels congested    Antimicrobials this admission: 8/9 azithromycin >>  8/9 Zosyn  >>   Microbiology results: 8/9 BCx: pending 8/9 UCx: pending  8/9 Sputum/Resp Cx: pending 8/9 MRSA PCR: pending   Thank you for allowing pharmacy to be a part of this patient's care.  Pernell Dupre, PharmD, BCPS Clinical Pharmacist 05/18/2018 9:34 AM

## 2018-05-19 ENCOUNTER — Inpatient Hospital Stay: Payer: Managed Care, Other (non HMO)

## 2018-05-19 ENCOUNTER — Encounter: Payer: Self-pay | Admitting: Radiology

## 2018-05-19 LAB — BLOOD GAS, ARTERIAL
ACID-BASE EXCESS: 3.3 mmol/L — AB (ref 0.0–2.0)
Acid-Base Excess: 7 mmol/L — ABNORMAL HIGH (ref 0.0–2.0)
Bicarbonate: 29.3 mmol/L — ABNORMAL HIGH (ref 20.0–28.0)
Bicarbonate: 30.1 mmol/L — ABNORMAL HIGH (ref 20.0–28.0)
FIO2: 0.35
FIO2: 0.3
LHR: 26 {breaths}/min
O2 SAT: 99.4 %
O2 SAT: 95.9 %
PCO2 ART: 32 mmHg (ref 32.0–48.0)
PCO2 ART: 57 mmHg — AB (ref 32.0–48.0)
PEEP: 5 cmH2O
PEEP: 5 cmH2O
PH ART: 7.33 — AB (ref 7.350–7.450)
Patient temperature: 37
Patient temperature: 37
RATE: 18 resp/min
VT: 450 mL
VT: 450 mL
pH, Arterial: 7.57 — ABNORMAL HIGH (ref 7.350–7.450)
pO2, Arterial: 140 mmHg — ABNORMAL HIGH (ref 83.0–108.0)
pO2, Arterial: 87 mmHg (ref 83.0–108.0)

## 2018-05-19 LAB — BASIC METABOLIC PANEL
ANION GAP: 7 (ref 5–15)
Anion gap: 6 (ref 5–15)
Anion gap: 5 (ref 5–15)
BUN: 12 mg/dL (ref 6–20)
BUN: 14 mg/dL (ref 6–20)
BUN: 21 mg/dL — AB (ref 6–20)
CALCIUM: 8.5 mg/dL — AB (ref 8.9–10.3)
CO2: 29 mmol/L (ref 22–32)
CO2: 28 mmol/L (ref 22–32)
CO2: 27 mmol/L (ref 22–32)
CREATININE: 0.73 mg/dL (ref 0.44–1.00)
CREATININE: 0.84 mg/dL (ref 0.44–1.00)
Calcium: 8.5 mg/dL — ABNORMAL LOW (ref 8.9–10.3)
Calcium: 8.4 mg/dL — ABNORMAL LOW (ref 8.9–10.3)
Chloride: 109 mmol/L (ref 98–111)
Chloride: 111 mmol/L (ref 98–111)
Chloride: 112 mmol/L — ABNORMAL HIGH (ref 98–111)
Creatinine, Ser: 0.77 mg/dL (ref 0.44–1.00)
Glucose, Bld: 133 mg/dL — ABNORMAL HIGH (ref 70–99)
Glucose, Bld: 124 mg/dL — ABNORMAL HIGH (ref 70–99)
Glucose, Bld: 133 mg/dL — ABNORMAL HIGH (ref 70–99)
POTASSIUM: 3.9 mmol/L (ref 3.5–5.1)
Potassium: 4 mmol/L (ref 3.5–5.1)
Potassium: 4.4 mmol/L (ref 3.5–5.1)
SODIUM: 145 mmol/L (ref 135–145)
SODIUM: 144 mmol/L (ref 135–145)
Sodium: 145 mmol/L (ref 135–145)

## 2018-05-19 LAB — GLUCOSE, CAPILLARY
GLUCOSE-CAPILLARY: 131 mg/dL — AB (ref 70–99)
GLUCOSE-CAPILLARY: 114 mg/dL — AB (ref 70–99)
GLUCOSE-CAPILLARY: 111 mg/dL — AB (ref 70–99)
Glucose-Capillary: 123 mg/dL — ABNORMAL HIGH (ref 70–99)
Glucose-Capillary: 113 mg/dL — ABNORMAL HIGH (ref 70–99)

## 2018-05-19 LAB — PHOSPHORUS
Phosphorus: 2.5 mg/dL (ref 2.5–4.6)
Phosphorus: 3.3 mg/dL (ref 2.5–4.6)
Phosphorus: 3.4 mg/dL (ref 2.5–4.6)

## 2018-05-19 LAB — MAGNESIUM
MAGNESIUM: 2.5 mg/dL — AB (ref 1.7–2.4)
Magnesium: 2.3 mg/dL (ref 1.7–2.4)
Magnesium: 2.4 mg/dL (ref 1.7–2.4)

## 2018-05-19 LAB — CALCIUM, IONIZED: Calcium, Ionized, Serum: 4.7 mg/dL (ref 4.5–5.6)

## 2018-05-19 LAB — FIBRIN DERIVATIVES D-DIMER (ARMC ONLY): Fibrin derivatives D-dimer (ARMC): 1587.63 ng/mL (FEU) — ABNORMAL HIGH (ref 0.00–499.00)

## 2018-05-19 LAB — PROCALCITONIN: PROCALCITONIN: 2.47 ng/mL

## 2018-05-19 LAB — HIV ANTIBODY (ROUTINE TESTING W REFLEX): HIV Screen 4th Generation wRfx: NONREACTIVE

## 2018-05-19 MED ORDER — ALPRAZOLAM 0.5 MG PO TABS
0.5000 mg | ORAL_TABLET | Freq: Three times a day (TID) | ORAL | Status: DC
  Administered 2018-05-19 – 2018-05-20 (×4): 0.5 mg via ORAL
  Filled 2018-05-19 (×4): qty 1

## 2018-05-19 MED ORDER — PNEUMOCOCCAL VAC POLYVALENT 25 MCG/0.5ML IJ INJ: 0.5000 mL | INJECTION | INTRAMUSCULAR | Status: DC | PRN

## 2018-05-19 MED ORDER — MIDAZOLAM HCL 2 MG/2ML IJ SOLN
2.0000 mg | INTRAMUSCULAR | Status: DC | PRN
  Administered 2018-05-20 (×4): 2 mg via INTRAVENOUS
  Filled 2018-05-19 (×4): qty 2

## 2018-05-19 MED ORDER — DEXMEDETOMIDINE HCL IN NACL 400 MCG/100ML IV SOLN
0.4000 ug/kg/h | INTRAVENOUS | Status: DC
  Administered 2018-05-19: 0.4 ug/kg/h via INTRAVENOUS
  Administered 2018-05-19: 0.8 ug/kg/h via INTRAVENOUS
  Administered 2018-05-20: 1.2 ug/kg/h via INTRAVENOUS
  Administered 2018-05-20 (×4): 1 ug/kg/h via INTRAVENOUS
  Filled 2018-05-19 (×8): qty 100

## 2018-05-19 MED ORDER — SODIUM CHLORIDE 0.9 % IV SOLN
1.0000 mg/kg/h | INTRAVENOUS | Status: DC
  Administered 2018-05-19 – 2018-05-20 (×5): 1 mg/kg/h via INTRAVENOUS
  Filled 2018-05-19: qty 100
  Filled 2018-05-19: qty 10
  Filled 2018-05-19 (×3): qty 100

## 2018-05-19 MED ORDER — IOPAMIDOL (ISOVUE-370) INJECTION 76%
75.0000 mL | Freq: Once | INTRAVENOUS | Status: AC | PRN
  Administered 2018-05-19: 150 mL via INTRAVENOUS

## 2018-05-19 MED ORDER — LACTATED RINGERS IV SOLN
INTRAVENOUS | Status: DC
  Administered 2018-05-19: 08:00:00 via INTRAVENOUS

## 2018-05-19 NOTE — Progress Notes (Signed)
[place] at [place] NAME: Jasmine Schultz    MR#:  [id number]  DATE OF BIRTH:  30-Apr-1976  SUBJECTIVE:  CHIEF COMPLAINT:   Chief Complaint  Patient presents with  . Shortness of Breath  Case discussed with intensivist, patient with severe anxiety/restlessness with tapering of ventilator-currently being treated with benzodiazepines as needed as well as aggressive pulmonary toilet  REVIEW OF SYSTEMS:  CONSTITUTIONAL: No fever, fatigue or weakness.  EYES: No blurred or double vision.  EARS, NOSE, AND THROAT: No tinnitus or ear pain.  RESPIRATORY: No cough, shortness of breath, wheezing or hemoptysis.  CARDIOVASCULAR: No chest pain, orthopnea, edema.  GASTROINTESTINAL: No nausea, vomiting, diarrhea or abdominal pain.  GENITOURINARY: No dysuria, hematuria.  ENDOCRINE: No polyuria, nocturia,  HEMATOLOGY: No anemia, easy bruising or bleeding SKIN: No rash or lesion. MUSCULOSKELETAL: No joint pain or arthritis.   NEUROLOGIC: No tingling, numbness, weakness.  PSYCHIATRY: No anxiety or depression.   ROS  DRUG ALLERGIES:   Allergies  Allergen Reactions  . Tomato Anaphylaxis and Rash  . Chocolate   . Oysters [Shellfish Allergy] Hives  . Peanut Butter Flavor     Feels congested    VITALS:  Blood pressure 121/72, pulse 87, temperature 99.5 F (37.5 C), temperature source Axillary, resp. rate (!) 26, height 5\' 8"  (1.727 m), weight (!) 141.6 kg, SpO2 98 %.  PHYSICAL EXAMINATION:  GENERAL:  42 y.o.-year-old patient lying in the bed with no acute distress.  EYES: Pupils equal, round, reactive to light and accommodation. No scleral icterus. Extraocular muscles intact.  HEENT: Head atraumatic, normocephalic. Oropharynx and nasopharynx clear.  NECK:  Supple, no jugular venous distention. No thyroid enlargement, no tenderness.  LUNGS: Normal breath sounds bilaterally, no wheezing, rales,rhonchi or crepitation. No use of accessory muscles of  respiration.  CARDIOVASCULAR: S1, S2 normal. No murmurs, rubs, or gallops.  ABDOMEN: Soft, nontender, nondistended. Bowel sounds present. No organomegaly or mass.  EXTREMITIES: No pedal edema, cyanosis, or clubbing.  NEUROLOGIC: Cranial nerves II through XII are intact. Muscle strength 5/5 in all extremities. Sensation intact. Gait not checked.  PSYCHIATRIC: The patient is alert and oriented x 3.  SKIN: No obvious rash, lesion, or ulcer.   Physical Exam LABORATORY PANEL:   CBC Recent Labs  Lab 05/18/18 0425  WBC 16.7*  HGB 10.1*  HCT 31.3*  PLT 599*   ------------------------------------------------------------------------------------------------------------------  Chemistries  Recent Labs  Lab 05/19/18 0521  NA 145  K 3.9  CL 109  CO2 29  GLUCOSE 133*  BUN 12  CREATININE 0.77  CALCIUM 8.5*  MG 2.3   ------------------------------------------------------------------------------------------------------------------  Cardiac Enzymes Recent Labs  Lab 05/17/18 1913  TROPONINI <0.03   ------------------------------------------------------------------------------------------------------------------  RADIOLOGY:  Dg Abdomen 1 View  Result Date: 05/17/2018 CLINICAL DATA:  Gastric tube placement EXAM: ABDOMEN - 1 VIEW COMPARISON:  None. FINDINGS: AP supine view of the abdomen demonstrates the tip and side port of a gastric tube below the diaphragm along the expected location of the greater curvature of the stomach. The tip projects over the right upper quadrant of the abdomen likely within distal stomach. Bowel gas pattern is unremarkable. There is no free air. IMPRESSION: Gastric tube in satisfactory position along the expected location of the stomach. Electronically Signed   By: Ashley Royalty M.D.   On: 05/17/2018 19:34   Dg Chest Port 1 View  Result Date: 05/18/2018 CLINICAL DATA:  Hypoxia EXAM: PORTABLE CHEST 1 VIEW COMPARISON:  May 17, 2018 FINDINGS: Endotracheal tube  tip  is 2.3 cm above the carina. Nasogastric tube tip and side port are below the diaphragm. No pneumothorax. There is atelectatic change in the left base with small left pleural effusion. The lungs elsewhere clear. Heart is upper normal in size with pulmonary vascularity normal. No adenopathy. No bone lesions. IMPRESSION: Positions as described without pneumothorax. Left base atelectasis with small left pleural effusion. Right lung clear. Stable cardiac silhouette. Electronically Signed   By: Lowella Grip III M.D.   On: 05/18/2018 10:49   Dg Chest Portable 1 View  Result Date: 05/17/2018 CLINICAL DATA:  Hypoxia EXAM: PORTABLE CHEST 1 VIEW COMPARISON:  June 05, 2015 FINDINGS: Endotracheal tube tip is at the carina. No pneumothorax. There is no edema or consolidation. Heart is slightly enlarged with pulmonary vascularity normal. No adenopathy. No bone lesions. IMPRESSION: Endotracheal tube tip is at the carina. Advise withdrawing endotracheal tube approximately 3 cm. No edema or consolidation.  Stable cardiac prominence. Electronically Signed   By: Lowella Grip III M.D.   On: 05/17/2018 19:13    ASSESSMENT AND PLAN:  42 year old female with a history of asthma who presented to the ER due to shortness of breath.  *Acute hypoxic respiratory failure with laryngeal edema Due primarily to asthma exacerbation Continue mechanical ventilation with weaning as tolerated, difficult weaning of vent due to severe anxiety/restlessness being managed with benzodiazepines/Kevin  *Acute asthmatic exacerbation Continue IV Solu-Medrol with tapering as tolerated, aggressive pulmonary toilet with bronchodilator therapy, inhaled corticosteroids  *Acute sepsis Suspected related to aspiration Continue empiric Zosyn/azithromycin, follow-up on cultures  *Acute kidney injury IV fluids for rehydration, avoid nephrotoxic agents, strict I&O monitoring, daily weights, BMP in the morning  Chronic general anxiety  disorder, unspecified Benzodiazepines as needed   All the records are reviewed and case discussed with Care Management/Social Workerr. Management plans discussed with the patient, family and they are in agreement.  CODE STATUS: full  TOTAL TIME TAKING CARE OF THIS PATIENT: 35 minutes.     POSSIBLE D/C IN 2-5 DAYS, DEPENDING ON CLINICAL CONDITION.   Avel Peace Cris Gibby M.D on 05/19/2018   Between 7am to 6pm - Pager - [phone]  After 6pm go to [email or web address] - password [email or web address] [facility]  Office  [phone]  CC: Primary care physician; Glendale Chard, MD  Note: This dictation was prepared with Dragon dictation along with smaller phrase technology. Any transcriptional errors that result from this process are unintentional.

## 2018-05-19 NOTE — Progress Notes (Signed)
Discussed plan of care with Dr. Soyla Murphy this morning.  No WUA per MD today as patient remains easily anxious when fully aroused  With increased work of breath and dyssynchony with vent.  Medications adjusted for patient comfort to optimize control of panic/anxiety when awake. Husband currently at bedside and updated about plan of care.  Will continue to assess and monitor closely.

## 2018-05-19 NOTE — Plan of Care (Signed)
Patient remains ventilated.  Remains on propofol and fentanyl at this time. Medications not titrated per report as patient has high anxiety.  Rested most of shift.  Mother at bedside. No acute distress noted. Able to follow simple commands.  Will continue to monitor.

## 2018-05-19 NOTE — Progress Notes (Signed)
Name: Jasmine Schultz MRN: [id number] DOB: 1975-12-11     CONSULTATION DATE: 05/17/2018  Subjective & objective:  Patient remains on the ventilator continues to have panic attack with titration of sedation.  PAST MEDICAL HISTORY :   has a past medical history of Anxiety attack, Asthma, Bursitis, Chronically dry eyes, GERD (gastroesophageal reflux disease), IBS (irritable bowel syndrome), Migraine, Multiple allergies, Sun allergy, and Tendonitis.  has a past surgical history that includes Tubal ligation; Breast reduction surgery; and Reduction mammaplasty (Bilateral). Prior to Admission medications   Medication Sig Start Date End Date Taking? Authorizing Provider  albuterol (PROVENTIL HFA;VENTOLIN HFA) 108 (90 BASE) MCG/ACT inhaler Inhale 2 puffs into the lungs every 6 (six) hours as needed for wheezing or shortness of breath. 06/06/15  Yes Short, Noah Delaine, MD  B Complex Vitamins (B-COMPLEX/B-12) LIQD Place 1 mL under the tongue daily.   Yes [provider]  beta carotene 25000 UNIT capsule Take 25,000 Units by mouth daily.   Yes [provider]  Bilberry, Vaccinium myrtillus, (BILBERRY PO) Take 250 mg by mouth daily.    Yes [provider]  Biotin 5 MG TABS Take 5 mg by mouth daily.    Yes [provider]  Chong Sicilian, Borago officinalis, (BORAGE OIL) 500 MG CAPS Take 500 mg by mouth daily.    Yes [provider]  Brewers Yeast 500 MG TABS Take 2,000 mg by mouth daily.    Yes [provider]  Cholecalciferol (VITAMIN D-3) 1000 units CAPS Take 6,000 Units by mouth daily.    Yes [provider]  Cinnamon 500 MG capsule Take 1,000 mg by mouth daily.    Yes [provider]  clobetasol cream (TEMOVATE) 7.93 % Apply 1 application topically daily as needed.    Yes [provider]  Coenzyme Q10 (CO Q 10) 100 MG CAPS Take 100 mg by mouth daily.    Yes [provider]  Cranberry-Milk Thistle (LIVER & KIDNEY CLEANSER  PO) Use 1 time weekly as directed as needed   Yes [provider]  Echinacea 380 MG CAPS Take 760 mg by mouth 2 (two) times daily.   Yes [provider]  Evening Primrose Oil 1000 MG CAPS Take 1,000 mg by mouth daily.   Yes [provider]  ferrous sulfate 325 (65 FE) MG tablet Take 325 mg by mouth daily.    Yes [provider]  Fluocin-Hydroquinone-Tretinoin (TRI-LUMA EX) Apply 1 application topically at bedtime.    Yes [provider]  fluticasone (CUTIVATE) 0.05 % cream Apply 1 application topically daily as needed (unknown indication).    Yes [provider]  fluticasone (FLONASE) 50 MCG/ACT nasal spray Place 1-2 sprays into both nostrils daily.    Yes [provider]  folic acid (FOLVITE) 903 MCG tablet Take 800 mg by mouth daily.    Yes [provider]  Ginkgo Biloba 120 MG TABS Take 120 mg by mouth daily.    Yes [provider]  Korean Panax Ginseng 100 MG CAPS Take 100 mg by mouth daily.    Yes [provider]  Lecithin 1200 MG CAPS Take 1,200 mg by mouth daily.    Yes [provider]  loratadine (CLARITIN) 10 MG tablet Take 10 mg by mouth daily.   Yes [provider]  Magnesium 250 MG TABS Take 250 mg by mouth daily.    Yes [provider]  Melatonin 10 MG TABS Take 10 mg by mouth at bedtime as  needed (sleep).    Yes [provider]  milk thistle 175 MG tablet Take 175 mg by mouth daily.   Yes [provider]  Misc Natural Products (COLON CLEANSER PO) Use 1 time weekly as directed as needed   Yes [provider]  montelukast (SINGULAIR) 10 MG tablet Take 1 tablet (10 mg total) by mouth at bedtime. 06/06/15  Yes Short, Noah Delaine, MD  Multiple Vitamins-Minerals (HAIR/SKIN/NAILS PO) Take 3 tablets by mouth daily.    Yes [provider]  nystatin (MYCOSTATIN/NYSTOP) 100000 UNIT/GM POWD Apply 1 g topically 2 (two) times daily.    Yes [provider]  Omega-3 Fatty Acids (FISH OIL) 1000 MG CAPS Take 1,000 mg by mouth daily.    Yes [provider]  ondansetron (ZOFRAN) 4 MG tablet Take 1 tablet (4 mg total) by mouth every 8 (eight) hours as needed for nausea or vomiting. 06/06/15  Yes Short, Noah Delaine, MD  pantoprazole (PROTONIX) 40 MG tablet Take 1 tablet (40 mg total) by mouth 2 (two) times daily before a meal. 06/06/15  Yes Short, Noah Delaine, MD  Potassium 99 MG TABS Take 99 mg by mouth daily.    Yes [provider]  Prenatal Vit-Fe Fumarate-FA (PRENATAL MULTIVITAMIN) TABS tablet Take 1 tablet by mouth daily.    Yes [provider]  Red Yeast Rice 600 MG CAPS Take 3,000 mg by mouth daily.    Yes [provider]  selenium 200 MCG TABS tablet Take 200 mcg by mouth daily.    Yes [provider]  triamcinolone cream (KENALOG) 0.1 % Apply 1 application topically daily as needed (unknown indication).    Yes [provider]  Turmeric Curcumin 500 MG CAPS Take 500 mg by mouth daily.    Yes [provider]   Allergies  Allergen Reactions  . Tomato Anaphylaxis and Rash  . Chocolate   . Oysters [Shellfish Allergy] Hives  . Peanut Butter Flavor     Feels congested    FAMILY HISTORY:  family history includes Aneurysm in her unknown relative; Asthma in her mother; Breast cancer in her unknown relative; CAD in her unknown relative; CVA in her unknown relative; Sickle cell anemia in her unknown relative. SOCIAL HISTORY:  reports that she has never smoked. She has never used smokeless tobacco. She reports that she drinks alcohol. She reports that she does not use drugs.  REVIEW OF SYSTEMS:   Unable to obtain due to critical illness   VITAL SIGNS: Temp:  [98.8 F (37.1 C)-99.9 F (37.7 C)] 99.5 F (37.5 C) (08/10 0800) Pulse Rate:  [87-105] 87 (08/10 1100) Resp:  [26] 26 (08/10 1100) BP: (104-125)/(47-72) 121/72 (08/10 1100) SpO2:  [95 %-100 %] 98 % (08/10 1100) FiO2  (%):  [28 %-30 %] 28 % (08/10 1134) Weight:  [141.6 kg] 141.6 kg (08/10 0433)  Physical Examination:  Sedated with propofol and fentanyl to RASS -4, restlessness and panicking with tapering of sedation. On vent, severe respiratory distress when awake no air movements S1 & S2 are audible with no murmur Benign abdominal exam with normal peristalsis No peripheral edema   ASSESSMENT / PLAN:  Acute respiratory failure.  Panicking with titration of sedation. -Optimize sedation with ketamine + propofol + fentanyl,  Xanax and continue with ventilator support.  Acute exacerbation of bronchial asthma was severe airway disease. -Optimize bronchodilators, ketamine, inhaled steroids, tapering systemic steroids. -Monitor minute ventilation and PIP.  Pneumonia was possible aspiration.  Bibasilar airspace disease -Empiric Zosyn +  Zithromax.  Monitor CXR + CBC + FiO2.  Sepsis with procalcitonin 2.68.  MRSA PCR negative -Empiric Zosyn, monitor procalcitonin, lactic acid and cultures.  AKI -Optimize hydration, avoid nephrotoxins, monitor renal panel and urine output.  Metabolic acidosis (improved). -d/c bicarb drip -Monitor renal panel, pH and taper bicarb drip off as tolerated.  Anemia -Keep Hb more than 7 g/dL.  Reactive thrombocytosis (improved) -Monitor platelets  Anxiety with panic attack. -Optimize Xanax  Full code  DVT + GI prophylaxis.  Continue with supportive care.  The patient progress was discussed with the family (mother) at the bedside who agreed to the plan of care.  Critical care time 35 minutes.

## 2018-05-19 NOTE — Progress Notes (Signed)
Patient transported to CT on ventilator without incident.  Vent settings verified with Dr Soyla Murphy upon arrival to icu.  Will continue to monitor.

## 2018-05-19 NOTE — Consult Note (Signed)
Pharmacy Antibiotic Note  Jasmine Schultz is a 42 y.o. female admitted on 05/17/2018 with asthma exacerbation and possible pneumonia.  Pharmacy has been consulted for Zosyn dosing.  Plan: Zosyn 3.375g IV q8h (4 hour infusion).   Patient is also receiving Azithromycin 500mg  IV daily.   Height: 5\' 8"  (172.7 cm) Weight: (!) 312 lb 2.7 oz (141.6 kg) IBW/kg (Calculated) : 63.9  Temp (24hrs), Avg:99.3 F (37.4 C), Min:98.8 F (37.1 C), Max:99.9 F (37.7 C)  Recent Labs  Lab 05/17/18 1913 05/17/18 2105 05/18/18 0425 05/18/18 1329 05/18/18 2049 05/19/18 0521 05/19/18 1243  WBC 11.0 19.7* 16.7*  --   --   --   --   CREATININE 0.81 1.00 1.03* 0.93 0.82 0.77 0.73    Estimated Creatinine Clearance: 136 mL/min (by C-G formula based on SCr of 0.73 mg/dL).    Allergies  Allergen Reactions  . Tomato Anaphylaxis and Rash  . Chocolate   . Oysters [Shellfish Allergy] Hives  . Peanut Butter Flavor     Feels congested    Antimicrobials this admission: 8/9 azithromycin >>  8/9 Zosyn  >>   Microbiology results: 8/9 BCx: NGTD 8/9 Sputum/Resp Cx: pending 8/9 MRSA PCR: neg  Thank you for allowing pharmacy to be a part of this patient's care.  Rocky Morel, PharmD, BCPS Clinical Pharmacist 05/19/2018 3:51 PM

## 2018-05-20 ENCOUNTER — Inpatient Hospital Stay: Payer: Managed Care, Other (non HMO)

## 2018-05-20 ENCOUNTER — Inpatient Hospital Stay: Payer: Self-pay

## 2018-05-20 LAB — BLOOD GAS, ARTERIAL
ACID-BASE EXCESS: 2.2 mmol/L — AB (ref 0.0–2.0)
ACID-BASE EXCESS: 4.5 mmol/L — AB (ref 0.0–2.0)
BICARBONATE: 28.2 mmol/L — AB (ref 20.0–28.0)
Bicarbonate: 25.9 mmol/L (ref 20.0–28.0)
FIO2: 0.3
FIO2: 0.36
MECHVT: 450 mL
Mechanical Rate: 20
O2 SAT: 97.6 %
O2 Saturation: 95.9 %
PATIENT TEMPERATURE: 37
PATIENT TEMPERATURE: 37
PCO2 ART: 50 mmHg — AB (ref 32.0–48.0)
PEEP/CPAP: 5 cmH2O
PH ART: 7.36 (ref 7.350–7.450)
PO2 ART: 101 mmHg (ref 83.0–108.0)
pCO2 arterial: 27 mmHg — ABNORMAL LOW (ref 32.0–48.0)
pH, Arterial: 7.59 — ABNORMAL HIGH (ref 7.350–7.450)
pO2, Arterial: 67 mmHg — ABNORMAL LOW (ref 83.0–108.0)

## 2018-05-20 LAB — CBC WITH DIFFERENTIAL/PLATELET
BASOS ABS: 0 10*3/uL (ref 0–0.1)
Basophils Relative: 0 %
Eosinophils Absolute: 0 10*3/uL (ref 0–0.7)
Eosinophils Relative: 0 %
HEMATOCRIT: 27.7 % — AB (ref 35.0–47.0)
HEMOGLOBIN: 8.8 g/dL — AB (ref 12.0–16.0)
Lymphocytes Relative: 4 %
Lymphs Abs: 0.5 10*3/uL — ABNORMAL LOW (ref 1.0–3.6)
MCH: 25.9 pg — ABNORMAL LOW (ref 26.0–34.0)
MCHC: 31.9 g/dL — AB (ref 32.0–36.0)
MCV: 81.3 fL (ref 80.0–100.0)
MONOS PCT: 6 %
Monocytes Absolute: 0.9 10*3/uL (ref 0.2–0.9)
NEUTROS ABS: 12.7 10*3/uL — AB (ref 1.4–6.5)
NEUTROS PCT: 90 %
Platelets: 492 10*3/uL — ABNORMAL HIGH (ref 150–440)
RBC: 3.4 MIL/uL — AB (ref 3.80–5.20)
RDW: 15.9 % — ABNORMAL HIGH (ref 11.5–14.5)
WBC: 14.2 10*3/uL — AB (ref 3.6–11.0)

## 2018-05-20 LAB — GLUCOSE, CAPILLARY
GLUCOSE-CAPILLARY: 129 mg/dL — AB (ref 70–99)
GLUCOSE-CAPILLARY: 121 mg/dL — AB (ref 70–99)
GLUCOSE-CAPILLARY: 118 mg/dL — AB (ref 70–99)
GLUCOSE-CAPILLARY: 107 mg/dL — AB (ref 70–99)
Glucose-Capillary: 130 mg/dL — ABNORMAL HIGH (ref 70–99)
Glucose-Capillary: 140 mg/dL — ABNORMAL HIGH (ref 70–99)

## 2018-05-20 LAB — PHOSPHORUS: Phosphorus: 3.5 mg/dL (ref 2.5–4.6)

## 2018-05-20 LAB — BASIC METABOLIC PANEL
ANION GAP: 4 — AB (ref 5–15)
BUN: 23 mg/dL — AB (ref 6–20)
CHLORIDE: 112 mmol/L — AB (ref 98–111)
CO2: 29 mmol/L (ref 22–32)
Calcium: 8.5 mg/dL — ABNORMAL LOW (ref 8.9–10.3)
Creatinine, Ser: 0.89 mg/dL (ref 0.44–1.00)
GFR calc Af Amer: >60 mL/min (ref >60)
GFR calc non Af Amer: >60 mL/min (ref >60)
Glucose, Bld: 136 mg/dL — ABNORMAL HIGH (ref 70–99)
POTASSIUM: 4.6 mmol/L (ref 3.5–5.1)
SODIUM: 145 mmol/L (ref 135–145)

## 2018-05-20 LAB — CALCIUM, IONIZED
Calcium, Ionized, Serum: 4.8 mg/dL (ref 4.5–5.6)
Calcium, Ionized, Serum: 4.9 mg/dL (ref 4.5–5.6)

## 2018-05-20 LAB — PROCALCITONIN: Procalcitonin: 1.24 ng/mL

## 2018-05-20 LAB — TRIGLYCERIDES: TRIGLYCERIDES: 155 mg/dL — AB (ref <150)

## 2018-05-20 LAB — MAGNESIUM: Magnesium: 2.6 mg/dL — ABNORMAL HIGH (ref 1.7–2.4)

## 2018-05-20 LAB — LACTIC ACID, PLASMA
Lactic Acid, Venous: 1.8 mmol/L (ref 0.5–1.9)
Lactic Acid, Venous: 1.3 mmol/L (ref 0.5–1.9)

## 2018-05-20 MED ORDER — ETOMIDATE 2 MG/ML IV SOLN
INTRAVENOUS | Status: AC
  Administered 2018-05-20: 20 mg via INTRAVENOUS
  Filled 2018-05-20: qty 10

## 2018-05-20 MED ORDER — ETOMIDATE 2 MG/ML IV SOLN
0.3000 mg/kg | Freq: Once | INTRAVENOUS | Status: AC
  Administered 2018-05-20: 20 mg via INTRAVENOUS

## 2018-05-20 MED ORDER — ADULT MULTIVITAMIN LIQUID CH
15.0000 mL | Freq: Every day | ORAL | Status: DC
Start: 2018-05-20 — End: 2018-05-21
  Filled 2018-05-20 (×2): qty 15

## 2018-05-20 MED ORDER — METHYLPREDNISOLONE SODIUM SUCC 40 MG IJ SOLR
40.0000 mg | Freq: Three times a day (TID) | INTRAMUSCULAR | Status: DC
  Administered 2018-05-20 – 2018-05-21 (×4): 40 mg via INTRAVENOUS
  Filled 2018-05-20 (×4): qty 1

## 2018-05-20 MED ORDER — FUROSEMIDE 10 MG/ML IJ SOLN
INTRAMUSCULAR | Status: AC
  Administered 2018-05-20: 20 mg
  Filled 2018-05-20: qty 2

## 2018-05-20 MED ORDER — RACEPINEPHRINE HCL 2.25 % IN NEBU
INHALATION_SOLUTION | RESPIRATORY_TRACT | Status: AC
  Administered 2018-05-20: 0.5 mL via RESPIRATORY_TRACT
  Filled 2018-05-20: qty 0.5

## 2018-05-20 MED ORDER — VITAL HIGH PROTEIN PO LIQD
1000.0000 mL | ORAL | Status: DC
  Administered 2018-05-20: 1000 mL

## 2018-05-20 MED ORDER — RISPERIDONE 0.25 MG PO TABS
0.2500 mg | ORAL_TABLET | Freq: Two times a day (BID) | ORAL | Status: DC
  Filled 2018-05-20 (×3): qty 1

## 2018-05-20 MED ORDER — ALPRAZOLAM 1 MG PO TABS
1.0000 mg | ORAL_TABLET | Freq: Three times a day (TID) | ORAL | Status: DC
  Administered 2018-05-21: 1 mg via ORAL
  Filled 2018-05-20 (×2): qty 1

## 2018-05-20 MED ORDER — FREE WATER
100.0000 mL | Freq: Three times a day (TID) | Status: DC
  Administered 2018-05-20: 100 mL

## 2018-05-20 MED ORDER — FUROSEMIDE 10 MG/ML IJ SOLN: 20.0000 mg | Freq: Once | INTRAMUSCULAR | Status: DC

## 2018-05-20 MED ORDER — VECURONIUM BROMIDE 10 MG IV SOLR
INTRAVENOUS | Status: AC
Start: 2018-05-20 — End: 2018-05-21
  Filled 2018-05-20: qty 10

## 2018-05-20 MED ORDER — FENTANYL CITRATE (PF) 100 MCG/2ML IJ SOLN
12.5000 ug | Freq: Once | INTRAMUSCULAR | Status: AC
  Administered 2018-05-20: 12.5 ug via INTRAVENOUS
  Filled 2018-05-20: qty 2

## 2018-05-20 MED ORDER — PRO-STAT SUGAR FREE PO LIQD: 30.0000 mL | Freq: Three times a day (TID) | ORAL | Status: DC

## 2018-05-20 MED ORDER — SODIUM CHLORIDE 0.9 % IV SOLN
2.0000 mg/h | INTRAVENOUS | Status: DC
  Administered 2018-05-20: 2 mg/h via INTRAVENOUS
  Filled 2018-05-20: qty 10

## 2018-05-20 MED ORDER — DEXMEDETOMIDINE HCL IN NACL 400 MCG/100ML IV SOLN
0.4000 ug/kg/h | INTRAVENOUS | Status: DC
  Administered 2018-05-21 (×2): 0.4 ug/kg/h via INTRAVENOUS
  Filled 2018-05-20 (×2): qty 100

## 2018-05-20 MED ORDER — LORAZEPAM 2 MG/ML IJ SOLN
0.5000 mg | Freq: Once | INTRAMUSCULAR | Status: AC
Start: 2018-05-20 — End: 2018-05-20
  Administered 2018-05-20: 0.5 mg via INTRAVENOUS
  Filled 2018-05-20: qty 1

## 2018-05-20 MED ORDER — RACEPINEPHRINE HCL 2.25 % IN NEBU
0.5000 mL | INHALATION_SOLUTION | Freq: Once | RESPIRATORY_TRACT | Status: AC
  Administered 2018-05-20: 0.5 mL via RESPIRATORY_TRACT

## 2018-05-20 NOTE — Progress Notes (Signed)
Code blue called to patients room. Patient self extubated. RN manually bagged patient until [place] at 2L could be placed. MD ordered racemic Epi and HFNC. Patient was given racemic epi and placed on HFNC 36 FIO2 35Lpm SAT at 98%. Patient tolerating HFNC well. Patient is alert and communicating. Clear/Diminisehed breath sounds present. No indication of stridor present at this time. ABG was ordered.

## 2018-05-20 NOTE — Progress Notes (Signed)
Spoke with RN re PICC placement.  States expected placement  05/21/18.  RN states will attempt to obtain consent when family arrives.

## 2018-05-20 NOTE — Progress Notes (Signed)
Unable to perform SBT due to patient unable to wean from sedation and follow commands. Patient experiences increased agitation and appears to have increased anxiety when sedation is weaned. O2 Sats decrease to low 80s and FIO2 has to be increased during these episodes.

## 2018-05-20 NOTE — Progress Notes (Signed)
Initially ETT was advanced 3 cm because of CXR showing tip of ETT at the level of Clavicles. Despite of being sedated with Precedex + Ketamin + Fentanyl + Xanax patient was able to reach out to ETT, pulled to 21 by the upper lip. Position was verified with glidescope, was advanced and confirmed with CXR and she was started on Versed. Later patient self extubated, no distress, somnolent, tolerating [place] with O2 sat 100%. -Off all sedation -Racemic Epi + Lasix + HFNC -Continue to monitor work of breathing, ABG, mental status and consider Precedex for restlessness and reintubation if needed.

## 2018-05-20 NOTE — Progress Notes (Signed)
After reviewing patient CXR and confirming with MD, patient's ETT advanced to 24 at lip. Patient tolerated ETT advancement well with no complications. Sat 96% with BBS.

## 2018-05-20 NOTE — Consult Note (Signed)
Pharmacy Antibiotic Note  Jasmine Schultz is a 42 y.o. female admitted on 05/17/2018 with asthma exacerbation and possible pneumonia.  Pharmacy has been consulted for Zosyn dosing.  Plan: Zosyn 3.375g IV q8h (4 hour infusion).   Patient is also receiving Azithromycin 500mg  IV daily.   Height: 5\' 8"  (172.7 cm) Weight: (!) 319 lb 3.6 oz (144.8 kg) IBW/kg (Calculated) : 63.9  Temp (24hrs), Avg:99.2 F (37.3 C), Min:98.9 F (37.2 C), Max:99.7 F (37.6 C)  Recent Labs  Lab 05/17/18 1913 05/17/18 2105 05/18/18 0425  05/18/18 2049 05/19/18 0521 05/19/18 1243 05/19/18 2216 05/20/18 0522  WBC 11.0 19.7* 16.7*  --   --   --   --   --  14.2*  CREATININE 0.81 1.00 1.03*   < > 0.82 0.77 0.73 0.84 0.89   < > = values in this interval not displayed.    Estimated Creatinine Clearance: 123.9 mL/min (by C-G formula based on SCr of 0.89 mg/dL).    Allergies  Allergen Reactions  . Tomato Anaphylaxis and Rash  . Chocolate   . Oysters [Shellfish Allergy] Hives  . Peanut Butter Flavor     Feels congested    Antimicrobials this admission: 8/9 azithromycin >>  8/9 Zosyn  >>   Microbiology results: 8/9 BCx: NGTD 8/9 Sputum/Resp Cx: pending 8/9 MRSA PCR: neg  Thank you for allowing pharmacy to be a part of this patient's care.  Rocky Morel, PharmD, BCPS Clinical Pharmacist 05/20/2018 10:57 AM

## 2018-05-20 NOTE — Progress Notes (Addendum)
Pt self extubated approx 1730, despite midazolam/fentanyl/ketamine gtt and safety mitts in place.   Precedex gtt ordered if needed, pt is calm at this time.  Sedation stopped, She was bagged immediately after self extubation until [place] placed.  O2 sats WDL but respiratory effort appeared somewhat reduced.  Per Dr Soyla Murphy HFNC started.  Pt appears more comfortable, asking "how long have I been here?" and stating she wants her husband.

## 2018-05-20 NOTE — Progress Notes (Signed)
Code blue called to patients room, RT responded and once to room found that patient had self extubated, RN was manually ventilating patient with BVM. Patient transitioned to 2L [place] with sats remaining in the mid 90's. Dr.Samaan in to see patient and ordered a racemic epi neb and a heated HFNC. Patient tolerating  well at this time.

## 2018-05-20 NOTE — Plan of Care (Signed)
Patient remains on ventilator.  CTA done--> negative.  Mom at bedside.  Precedex and Fentanyl increased as patient became agitated during CTA procedure. Patient continues with low grade temperature.  No acute distress noted.  Will continue to monitor.

## 2018-05-20 NOTE — Progress Notes (Signed)
Name: Jasmine Schultz MRN: [id number] DOB: [birth year]     CONSULTATION DATE: 05/17/2018  Subjective & Objectives: Remains on vent and improved bronchospasm  PAST MEDICAL HISTORY :   has a past medical history of Anxiety attack, Asthma, Bursitis, Chronically dry eyes, GERD (gastroesophageal reflux disease), IBS (irritable bowel syndrome), Migraine, Multiple allergies, Sun allergy, and Tendonitis.  has a past surgical history that includes Tubal ligation; Breast reduction surgery; and Reduction mammaplasty (Bilateral). Prior to Admission medications   Medication Sig Start Date End Date Taking? Authorizing Provider  albuterol (PROVENTIL HFA;VENTOLIN HFA) 108 (90 BASE) MCG/ACT inhaler Inhale 2 puffs into the lungs every 6 (six) hours as needed for wheezing or shortness of breath. 06/06/15  Yes Short, Noah Delaine, MD  B Complex Vitamins (B-COMPLEX/B-12) LIQD Place 1 mL under the tongue daily.   Yes [provider]  beta carotene 25000 UNIT capsule Take 25,000 Units by mouth daily.   Yes [provider]  Bilberry, Vaccinium myrtillus, (BILBERRY PO) Take 250 mg by mouth daily.    Yes [provider]  Biotin 5 MG TABS Take 5 mg by mouth daily.    Yes [provider]  Chong Sicilian, Borago officinalis, (BORAGE OIL) 500 MG CAPS Take 500 mg by mouth daily.    Yes [provider]  Brewers Yeast 500 MG TABS Take 2,000 mg by mouth daily.    Yes [provider]  Cholecalciferol (VITAMIN D-3) 1000 units CAPS Take 6,000 Units by mouth daily.    Yes [provider]  Cinnamon 500 MG capsule Take 1,000 mg by mouth daily.    Yes [provider]  clobetasol cream (TEMOVATE) 7.82 % Apply 1 application topically daily as needed.    Yes [provider]  Coenzyme Q10 (CO Q 10) 100 MG CAPS Take 100 mg by mouth daily.    Yes [provider]  Cranberry-Milk Thistle (LIVER & KIDNEY CLEANSER PO) Use 1 time weekly as directed as needed   Yes  [provider]  Echinacea 380 MG CAPS Take 760 mg by mouth 2 (two) times daily.   Yes [provider]  Evening Primrose Oil 1000 MG CAPS Take 1,000 mg by mouth daily.   Yes [provider]  ferrous sulfate 325 (65 FE) MG tablet Take 325 mg by mouth daily.    Yes [provider]  Fluocin-Hydroquinone-Tretinoin (TRI-LUMA EX) Apply 1 application topically at bedtime.    Yes [provider]  fluticasone (CUTIVATE) 0.05 % cream Apply 1 application topically daily as needed (unknown indication).    Yes [provider]  fluticasone (FLONASE) 50 MCG/ACT nasal spray Place 1-2 sprays into both nostrils daily.    Yes [provider]  folic acid (FOLVITE) 956 MCG tablet Take 800 mg by mouth daily.    Yes [provider]  Ginkgo Biloba 120 MG TABS Take 120 mg by mouth daily.    Yes [provider]  Korean Panax Ginseng 100 MG CAPS Take 100 mg by mouth daily.    Yes [provider]  Lecithin 1200 MG CAPS Take 1,200 mg by mouth daily.    Yes [provider]  loratadine (CLARITIN) 10 MG tablet Take 10 mg by mouth daily.   Yes [provider]  Magnesium 250 MG TABS Take 250 mg by mouth daily.    Yes [provider]  Melatonin 10 MG TABS Take 10 mg by mouth at bedtime as needed (sleep).    Yes [provider]  milk thistle 175 MG tablet Take 175 mg by mouth daily.   Yes [provider]  Misc Natural Products (COLON CLEANSER PO) Use 1 time weekly as directed as needed   Yes [provider]  montelukast (SINGULAIR) 10 MG tablet Take 1 tablet (10 mg total) by mouth at bedtime. 06/06/15  Yes Short, Noah Delaine, MD  Multiple Vitamins-Minerals (HAIR/SKIN/NAILS PO) Take 3 tablets by mouth daily.    Yes [provider]  nystatin (MYCOSTATIN/NYSTOP) 100000 UNIT/GM POWD Apply 1 g topically 2 (two) times daily.    Yes [provider]  Omega-3 Fatty Acids (FISH OIL)  1000 MG CAPS Take 1,000 mg by mouth daily.    Yes [provider]  ondansetron (ZOFRAN) 4 MG tablet Take 1 tablet (4 mg total) by mouth every 8 (eight) hours as needed for nausea or vomiting. 06/06/15  Yes Short, Noah Delaine, MD  pantoprazole (PROTONIX) 40 MG tablet Take 1 tablet (40 mg total) by mouth 2 (two) times daily before a meal. 06/06/15  Yes Short, Noah Delaine, MD  Potassium 99 MG TABS Take 99 mg by mouth daily.    Yes [provider]  Prenatal Vit-Fe Fumarate-FA (PRENATAL MULTIVITAMIN) TABS tablet Take 1 tablet by mouth daily.    Yes [provider]  Red Yeast Rice 600 MG CAPS Take 3,000 mg by mouth daily.    Yes [provider]  selenium 200 MCG TABS tablet Take 200 mcg by mouth daily.    Yes [provider]  triamcinolone cream (KENALOG) 0.1 % Apply 1 application topically daily as needed (unknown indication).    Yes [provider]  Turmeric Curcumin 500 MG CAPS Take 500 mg by mouth daily.    Yes [provider]   Allergies  Allergen Reactions  . Tomato Anaphylaxis and Rash  . Chocolate   . Oysters [Shellfish Allergy] Hives  . Peanut Butter Flavor     Feels congested    FAMILY HISTORY:  family history includes Aneurysm in her unknown relative; Asthma in her mother; Breast cancer in her unknown relative; CAD in her unknown relative; CVA in her unknown relative; Sickle cell anemia in her unknown relative. SOCIAL HISTORY:  reports that she has never smoked. She has never used smokeless tobacco. She reports that she drinks alcohol. She reports that she does not use drugs.  REVIEW OF SYSTEMS:   Unable to obtain due to critical illness   VITAL SIGNS: Temp:  [99 F (37.2 C)-99.7 F (37.6 C)] 99.1 F (37.3 C) (08/11 0800) Pulse Rate:  [84-106] 101 (08/11 1100) Resp:  [16-26] 25 (08/11 1100) BP: (101-157)/(59-96) 154/84 (08/11 1100) SpO2:  [94 %-100 %] 94 % (08/11 1141) FiO2 (%):  [28 %-45 %] 45 % (08/11 1141) Weight:   [144.8 kg] 144.8 kg (08/11 0500)  Physical Examination:  Sedated with Precedex, Ketamin, fentanyl to RASS -3 On vent, BEAE and no rales. S1 & S2 are audible with no murmur Benign abdominal exam with normal peristalsis No peripheral edema   ASSESSMENT / PLAN:  Acute respiratory failure.Panicking with titration of sedation. -Optimize sedation with ketamine + Precedex + fentanyl, Xanax and continue with ventilator support. -SAT + SBT and consider weaning parameters.  Acute exacerbation of bronchial asthma was severe airway disease. -Optimize bronchodilators, ketamine, inhaled steroids, tapering systemic steroids. -Monitor minute ventilation and PIP.  Pneumonia with possible aspiration. Bibasilar airspace disease -Empiric Zosyn + Zithromax. Monitor CXR + CBC + FiO2.  Sepsis (improved with procalcitonin 2.68 to 1.24).MRSA PCR negative -Empiric  Zosyn, monitor procalcitonin, lactic acid and cultures.  AKI (improved) -Optimize hydration, avoid nephrotoxins, monitor renal panel and urine output.  HTN -Optimize antihypertensives and monitor hemodynamics.  Metabolic acidosis (improved).Off bicarb drip -Monitor renal panel  Anemia -Keep Hb more than 7 g/dL.  Reactive thrombocytosis (improved) -Monitor platelets  PE is ruled out with CTA chest.  Anxiety with panic attack. -Optimize Xanax  Full code  DVT + GI prophylaxis. Continue with supportive care.  The patient progress was discussed with the family (mother) at the bedside who agreed to the plan of care.  Critical care time 35 minutes.

## 2018-05-20 NOTE — Progress Notes (Signed)
[place] [facility] [place] NAME: Jasmine Schultz    MR#:  [id number]  DATE OF BIRTH:  29-Aug-1976  SUBJECTIVE:  CHIEF COMPLAINT:   Chief Complaint  Patient presents with  . Shortness of Breath  Continue need for intubation, difficult weaning from vent due to anxiety  REVIEW OF SYSTEMS:  CONSTITUTIONAL: No fever, fatigue or weakness.  EYES: No blurred or double vision.  EARS, NOSE, AND THROAT: No tinnitus or ear pain.  RESPIRATORY: No cough, shortness of breath, wheezing or hemoptysis.  CARDIOVASCULAR: No chest pain, orthopnea, edema.  GASTROINTESTINAL: No nausea, vomiting, diarrhea or abdominal pain.  GENITOURINARY: No dysuria, hematuria.  ENDOCRINE: No polyuria, nocturia,  HEMATOLOGY: No anemia, easy bruising or bleeding SKIN: No rash or lesion. MUSCULOSKELETAL: No joint pain or arthritis.   NEUROLOGIC: No tingling, numbness, weakness.  PSYCHIATRY: No anxiety or depression.   ROS  DRUG ALLERGIES:   Allergies  Allergen Reactions  . Tomato Anaphylaxis and Rash  . Chocolate   . Oysters [Shellfish Allergy] Hives  . Peanut Butter Flavor     Feels congested    VITALS:  Blood pressure (!) 143/79, pulse (!) 108, temperature 100.2 F (37.9 C), temperature source Axillary, resp. rate 19, height 5\' 8"  (1.727 m), weight (!) 144.8 kg, SpO2 92 %.  PHYSICAL EXAMINATION:  GENERAL:  42 y.o.-year-old patient lying in the bed with no acute distress.  EYES: Pupils equal, round, reactive to light and accommodation. No scleral icterus. Extraocular muscles intact.  HEENT: Head atraumatic, normocephalic. Oropharynx and nasopharynx clear.  NECK:  Supple, no jugular venous distention. No thyroid enlargement, no tenderness.  LUNGS: Normal breath sounds bilaterally, no wheezing, rales,rhonchi or crepitation. No use of accessory muscles of respiration.  CARDIOVASCULAR: S1, S2 normal. No murmurs, rubs, or gallops.  ABDOMEN: Soft, nontender, nondistended. Bowel  sounds present. No organomegaly or mass.  EXTREMITIES: No pedal edema, cyanosis, or clubbing.  NEUROLOGIC: Cranial nerves II through XII are intact. Muscle strength 5/5 in all extremities. Sensation intact. Gait not checked.  PSYCHIATRIC: The patient is alert and oriented x 3.  SKIN: No obvious rash, lesion, or ulcer.   Physical Exam LABORATORY PANEL:   CBC Recent Labs  Lab 05/20/18 0522  WBC 14.2*  HGB 8.8*  HCT 27.7*  PLT 492*   ------------------------------------------------------------------------------------------------------------------  Chemistries  Recent Labs  Lab 05/20/18 0522  NA 145  K 4.6  CL 112*  CO2 29  GLUCOSE 136*  BUN 23*  CREATININE 0.89  CALCIUM 8.5*  MG 2.6*   ------------------------------------------------------------------------------------------------------------------  Cardiac Enzymes Recent Labs  Lab 05/17/18 1913  TROPONINI <0.03   ------------------------------------------------------------------------------------------------------------------  RADIOLOGY:  Ct Angio Chest Pe W Or Wo Contrast  Result Date: 05/19/2018 CLINICAL DATA:  Acute onset of hypoxic respiratory failure. EXAM: CT ANGIOGRAPHY CHEST WITH CONTRAST TECHNIQUE: Multidetector CT imaging of the chest was performed using the standard protocol during bolus administration of intravenous contrast. Multiplanar CT image reconstructions and MIPs were obtained to evaluate the vascular anatomy. CONTRAST:  173mL ISOVUE-370 IOPAMIDOL (ISOVUE-370) INJECTION 76% COMPARISON:  Chest radiograph performed 05/18/2018 FINDINGS: Cardiovascular: Evaluation is markedly suboptimal due to limitations in the timing of the contrast bolus. No central pulmonary embolus is seen. However, the study is essentially nondiagnostic for pulmonary embolus. The heart is borderline normal in size. The thoracic aorta is grossly unremarkable. The great vessels are within normal limits. Mediastinum/Nodes: The  mediastinum is grossly unremarkable. No mediastinal lymphadenopathy is seen. No pericardial effusion is identified. The visualized portions of  the thyroid gland are grossly unremarkable. No axillary lymphadenopathy is seen. The patient's endotracheal tube is seen ending 3 cm above the carina. An enteric tube is noted extending into the stomach. Lungs/Pleura: Mild left basilar airspace opacity likely reflects atelectasis. Some of this is nodular in appearance. No pleural effusion or pneumothorax is identified. No masses are identified. Upper Abdomen: The visualized portions of the liver and spleen are unremarkable. The visualized portions of the gallbladder, pancreas, adrenal glands and kidneys are grossly unremarkable. Musculoskeletal: No acute osseous abnormalities are identified. The visualized musculature is unremarkable in appearance. Review of the MIP images confirms the above findings. IMPRESSION: 1. Evaluation is markedly suboptimal due to limitations in the timing of the contrast bolus. No central pulmonary embolus seen. However, the study is essentially nondiagnostic for pulmonary embolus. 2. Mild left basilar airspace opacity likely reflects atelectasis. Some of this is nodular in appearance. Lungs otherwise clear. Electronically Signed   By: Garald Balding M.D.   On: 05/19/2018 21:27   Dg Chest Port 1 View  Result Date: 05/20/2018 CLINICAL DATA:  Atelectasis, history of asthma EXAM: PORTABLE CHEST 1 VIEW COMPARISON:  CTA chest dated 05/19/2018 FINDINGS: Endotracheal tube terminates 4.5 cm above the carina. Mild left basilar opacity, likely atelectasis. Right lung is clear. No pleural effusion or pneumothorax. The heart is top-normal in size/mildly enlarged. Enteric tube courses into the stomach. IMPRESSION: Endotracheal tube terminates 4.5 cm above the carina. Mild left basilar opacity, likely atelectasis. Electronically Signed   By: Julian Hy M.D.   On: 05/20/2018 08:04   Dg Abd Portable  1v  Result Date: 05/20/2018 CLINICAL DATA:  Orogastric tube placement. EXAM: PORTABLE ABDOMEN - 1 VIEW COMPARISON:  05/17/2018 FINDINGS: Orogastric tube tip extends well into the distal stomach based on radiographic course. Visualized bowel gas pattern is unremarkable. IMPRESSION: Orogastric tube tip extends into the distal stomach. Electronically Signed   By: Aletta Edouard M.D.   On: 05/20/2018 10:06    ASSESSMENT AND PLAN:  42 year old female with a history of asthma who presented to the ER due to shortness of breath.  *Acute hypoxic respiratory failure with laryngeal edema Due primarily to asthma exacerbation Continue mechanical ventilation with weaning as tolerated, difficult weaning of vent due to severe anxiety/restlessness being managed with benzodiazepines/Kevin  *Acute asthmatic exacerbation Exacerbated by pneumonia Continue IV Solu-Medrol with tapering as tolerated, aggressive pulmonary toilet with bronchodilator therapy, inhaled corticosteroids  *Acute community acquired pneumonia, POA Cannot rule out aspiration Continue empiric Zosyn/azithromycin, follow-up on cultures  *Acute sepsis Suspected related to pneumonia/?aspiration Continue empiric Zosyn/azithromycin, follow-up on cultures  *Acute kidney injury IV fluids for rehydration, avoid nephrotoxic agents, strict I&O monitoring, daily weights, BMP in the morning  *Chronic general anxiety disorder, unspecified Benzodiazepines as needed  All the records are reviewed and case discussed with Care Management/Social Workerr. Management plans discussed with the patient, family and they are in agreement.  CODE STATUS: full  TOTAL TIME TAKING CARE OF THIS PATIENT: 35 minutes.     POSSIBLE D/C IN 2-5 DAYS, DEPENDING ON CLINICAL CONDITION.   Avel Peace Salary M.D on 05/20/2018   Between 7am to 6pm - Pager - [phone]  After 6pm go to [email or web address] - password [email or web address] [facility]  Office   [phone]  CC: Primary care physician; Glendale Chard, MD  Note: This dictation was prepared with Dragon dictation along with smaller phrase technology. Any transcriptional errors that result from this process are unintentional.

## 2018-05-20 NOTE — Progress Notes (Signed)
Nutrition Follow-up  DOCUMENTATION CODES:   Morbid obesity  INTERVENTION:  Initiate new goal TF regimen of Vital High Protein at 60 mL/hr (1440 mL goal daily volume) + Pro-Stat 30 mL TID via OGT. Provides 1740 kcal, 171 grams of protein, 1210 mL H2O daily.  Provide liquid MVI daily.  Provide free water flush of 100 mL Q8hrs. Provides a total of 1510 mL H2O daily including water in tube feeding.  NUTRITION DIAGNOSIS:   Inadequate oral intake related to inability to eat as evidenced by NPO status.  Ongoing - addressing with TF regimen.  GOAL:   Provide needs based on ASPEN/SCCM guidelines  Met with TF regimen.  MONITOR:   TF tolerance, I & O's, Labs, Weight trends  REASON FOR ASSESSMENT:   Consult Enteral/tube feeding initiation and management  ASSESSMENT:   41 year old female with PMHx of IBS, GERD, anxiety, asthma admitted with acute respiratory failure secondary to acute exacerbation of bronchial asthma requiring intubation 8/8, PNA with possible aspiration, sepsis, AKI, anxiety with panic attack.  Patient intubated and sedated. On PRVC mode with FiO2 28%. Abdomen feels soft. No documented BM yet this admission.  Access: 16 Fr. OGT placed 8/8; terminates in stomach per chest x-ray 8/11; 56 cm at corner of mouth  MAP: 101-108 mmHg  TF: pt receiving Vital High Protein at 30 mL/hr and tolerating  Patient is currently intubated on ventilator support Ve: 8.7 L/min Temp (24hrs), Avg:99.2 F (37.3 C), Min:98.9 F (37.2 C), Max:99.7 F (37.6 C)  Propofol: N/A  Medications reviewed and include: Xanax 0.5 mg Q8hrs, Novolog 0-20 units Q4hrs (received 9 units past 24 hrs), Solu-medrol 60 mg Q8hrs IV, azithromycin, Precedex gtt, famotidine, fentanyl gtt now off, ketamine gtt, LR @ 20 mL/hr, Zosyn.  Labs reviewed: CBG 111-140 past 24 hrs, Chloride 112, BUN 23, Anion gap 4, Magnesium 2.6.  I/O: 1640 mL UOP yesterday (0.5 mL/kg/hr)  Weight trend: 144.8 kg on 8/11; +3.9  kg from admission  Patient does not meet criteria for malnutrition.  Discussed with RN. Patient is off propofol gtt and tube feeds have been increased to 30 mL/hr.  Diet Order:   Diet Order            Diet NPO time specified  Diet effective now              EDUCATION NEEDS:   No education needs have been identified at this time  Skin:  Skin Assessment: Reviewed RN Assessment  Last BM:  05/17/2018  Height:   Ht Readings from Last 1 Encounters:  05/17/18 5' 8" (1.727 m)    Weight:   Wt Readings from Last 1 Encounters:  05/20/18 (!) 144.8 kg    Ideal Body Weight:  63.63 kg  BMI:  Body mass index is 48.54 kg/m.  Estimated Nutritional Needs:   Kcal:  1550 - 1973 calories (11-14 cal/kg)  Protein:  >/= 159 grams  Fluid:  >1.5L   Stephens, MS, RD, LDN Office: [phone] Pager: [phone] After Hours/Weekend Pager: [phone]  

## 2018-05-20 NOTE — Progress Notes (Addendum)
Patient's fentanyl titrated down to 0, patient resting quietly, not following commands consistently or opening her eyes.  started ketamine down titration, pt awakened almost immediately and began pulling at ETT, eyes wide and she appeared panicked, sternal retractions visible and RR in the 40s, O2 sats maintained well however.  Sedation titrated back up and pt given supplemental O2 to maintain adequate oxygenation.  Secretions minimal but abnormal sounds were audible from the airway.  Eventually patient became so combative and agitated sedation was titrated > than pre WUA, and PRN versed was required.  Pt was responsive during this time but would not follow commands and was unable to be calmed by staff.

## 2018-05-20 NOTE — Progress Notes (Addendum)
Patient became very agitated during bath and almost self extubated. ETT was displaced to 21cm at lip. RNs and RTs present in room. MD called to bedside to evaluate for either tube advancement or extubation. Patient was sedated and tube was advanced to 24cm by MD. BBS present. SAT 98% on 40 FIO2. Will continue to monitor.

## 2018-05-21 DIAGNOSIS — J4552 Severe persistent asthma with status asthmaticus: Secondary | ICD-10-CM

## 2018-05-21 LAB — BASIC METABOLIC PANEL
ANION GAP: 6 (ref 5–15)
BUN: 27 mg/dL — ABNORMAL HIGH (ref 6–20)
CALCIUM: 8.3 mg/dL — AB (ref 8.9–10.3)
CHLORIDE: 110 mmol/L (ref 98–111)
CO2: 27 mmol/L (ref 22–32)
Creatinine, Ser: 0.7 mg/dL (ref 0.44–1.00)
GFR calc Af Amer: >60 mL/min (ref >60)
GFR calc non Af Amer: >60 mL/min (ref >60)
Glucose, Bld: 112 mg/dL — ABNORMAL HIGH (ref 70–99)
POTASSIUM: 3.9 mmol/L (ref 3.5–5.1)
Sodium: 143 mmol/L (ref 135–145)

## 2018-05-21 LAB — GLUCOSE, CAPILLARY
GLUCOSE-CAPILLARY: 110 mg/dL — AB (ref 70–99)
GLUCOSE-CAPILLARY: 97 mg/dL (ref 70–99)
GLUCOSE-CAPILLARY: 71 mg/dL (ref 70–99)
Glucose-Capillary: 102 mg/dL — ABNORMAL HIGH (ref 70–99)
Glucose-Capillary: 132 mg/dL — ABNORMAL HIGH (ref 70–99)
Glucose-Capillary: 82 mg/dL (ref 70–99)
Glucose-Capillary: 94 mg/dL (ref 70–99)

## 2018-05-21 LAB — CBC WITH DIFFERENTIAL/PLATELET
BASOS ABS: 0.1 10*3/uL (ref 0–0.1)
Basophils Relative: 0 %
EOS PCT: 0 %
Eosinophils Absolute: 0 10*3/uL (ref 0–0.7)
HCT: 28.4 % — ABNORMAL LOW (ref 35.0–47.0)
HEMOGLOBIN: 8.8 g/dL — AB (ref 12.0–16.0)
LYMPHS PCT: 12 %
Lymphs Abs: 1.8 10*3/uL (ref 1.0–3.6)
MCH: 25.3 pg — ABNORMAL LOW (ref 26.0–34.0)
MCHC: 31 g/dL — ABNORMAL LOW (ref 32.0–36.0)
MCV: 81.5 fL (ref 80.0–100.0)
Monocytes Absolute: 1.4 10*3/uL — ABNORMAL HIGH (ref 0.2–0.9)
Monocytes Relative: 9 %
NEUTROS ABS: 12.1 10*3/uL — AB (ref 1.4–6.5)
NEUTROS PCT: 79 %
PLATELETS: 388 10*3/uL (ref 150–440)
RBC: 3.49 MIL/uL — AB (ref 3.80–5.20)
RDW: 15.9 % — ABNORMAL HIGH (ref 11.5–14.5)
WBC: 15.3 10*3/uL — AB (ref 3.6–11.0)

## 2018-05-21 LAB — CULTURE, RESPIRATORY W GRAM STAIN: Culture: NORMAL

## 2018-05-21 LAB — PHOSPHORUS: PHOSPHORUS: 3.1 mg/dL (ref 2.5–4.6)

## 2018-05-21 LAB — MAGNESIUM: Magnesium: 2.3 mg/dL (ref 1.7–2.4)

## 2018-05-21 MED ORDER — DEXTROSE IN LACTATED RINGERS 5 % IV SOLN
INTRAVENOUS | Status: DC
  Administered 2018-05-21: 18:00:00 via INTRAVENOUS

## 2018-05-21 MED ORDER — IPRATROPIUM-ALBUTEROL 0.5-2.5 (3) MG/3ML IN SOLN: 3.0000 mL | RESPIRATORY_TRACT | Status: DC | PRN

## 2018-05-21 MED ORDER — ENOXAPARIN SODIUM 40 MG/0.4ML ~~LOC~~ SOLN
40.0000 mg | Freq: Two times a day (BID) | SUBCUTANEOUS | Status: DC
  Administered 2018-05-21 – 2018-05-23 (×4): 40 mg via SUBCUTANEOUS
  Filled 2018-05-21 (×4): qty 0.4

## 2018-05-21 MED ORDER — ALPRAZOLAM 1 MG PO TABS: 1.0000 mg | ORAL_TABLET | Freq: Three times a day (TID) | ORAL | Status: DC | PRN

## 2018-05-21 MED ORDER — SODIUM CHLORIDE 0.9% FLUSH: 10.0000 mL | INTRAVENOUS | Status: DC | PRN

## 2018-05-21 MED ORDER — METHYLPREDNISOLONE SODIUM SUCC 40 MG IJ SOLR
40.0000 mg | Freq: Two times a day (BID) | INTRAMUSCULAR | Status: AC
  Administered 2018-05-22 (×2): 40 mg via INTRAVENOUS
  Filled 2018-05-21 (×2): qty 1

## 2018-05-21 MED ORDER — ORAL CARE MOUTH RINSE: 15.0000 mL | Freq: Two times a day (BID) | OROMUCOSAL | Status: DC

## 2018-05-21 MED ORDER — ALPRAZOLAM 0.5 MG PO TABS
0.5000 mg | ORAL_TABLET | Freq: Three times a day (TID) | ORAL | Status: DC | PRN
  Administered 2018-05-21 – 2018-05-23 (×4): 0.5 mg via ORAL
  Filled 2018-05-21 (×4): qty 1

## 2018-05-21 MED ORDER — SODIUM CHLORIDE 0.9% FLUSH
10.0000 mL | Freq: Two times a day (BID) | INTRAVENOUS | Status: DC
  Administered 2018-05-21 – 2018-05-22 (×3): 10 mL

## 2018-05-21 MED ORDER — IPRATROPIUM-ALBUTEROL 0.5-2.5 (3) MG/3ML IN SOLN: 3.0000 mL | Freq: Four times a day (QID) | RESPIRATORY_TRACT | Status: DC | PRN

## 2018-05-21 MED ORDER — CHLORHEXIDINE GLUCONATE 0.12 % MT SOLN: 15.0000 mL | Freq: Two times a day (BID) | OROMUCOSAL | Status: DC

## 2018-05-21 NOTE — Progress Notes (Signed)
Pt up to chair with assist x 1, states throat sore, "I'm afraid to swallow."  But she was able to take meds crushed in applesauce, chewing a great deal of ice.  IVT unable to place a second PIV for [place].  On room air now.  A little calmer with PO Xanax and stopped reporting "I can't breathe, my throat is closing.".  She is somewhat withdrawn and affect is flat; speaks in a whisper.  Family at bedside.

## 2018-05-21 NOTE — Plan of Care (Signed)
Patient self extubated yesterday. Very anxious and panicky during shift.  Needed much redirection and reassurance that breathing and oxygen level is 'good'.  Mom at bedside at this time.  Able to tolerate ice chips.  Remained afebrile this shift. No acute respiratory concerns.  Good urine output. Able to make needs known.  Will continue to monitor.

## 2018-05-21 NOTE — Progress Notes (Signed)
Patient self extubated last night.  Tolerating well.  Hoarse voice but no distress.  Vitals:   05/21/18 1000 05/21/18 1100 05/21/18 1200 05/21/18 1400  BP: 129/78 (!) 132/120 (!) 163/75   Pulse: 88 89 91 86  Resp: (!) 29 (!) 28 (!) 26 (!) 26  Temp:   99.1 F (37.3 C)   TempSrc:   Axillary   SpO2: 100% 99% 99% 97%  Weight:      Height:      Room air  Hoarse voice quality HEENT WNL JVP not visualized Diffuse scattered coarse wheezes RRR, no M Obese, soft, NABS Extremities warm, no edema Cognition intact, no focal neurologic deficits  BMP Latest Ref Rng & Units 05/21/2018 05/20/2018 05/19/2018  Glucose 70 - 99 mg/dL 112(H) 136(H) 133(H)  BUN 6 - 20 mg/dL 27(H) 23(H) 21(H)  Creatinine 0.44 - 1.00 mg/dL 0.70 0.89 0.84  Sodium 135 - 145 mmol/L 143 145 144  Potassium 3.5 - 5.1 mmol/L 3.9 4.6 4.4  Chloride 98 - 111 mmol/L 110 112(H) 112(H)  CO2 22 - 32 mmol/L 27 29 27   Calcium 8.9 - 10.3 mg/dL 8.3(L) 8.5(L) 8.4(L)      CBC Latest Ref Rng & Units 05/21/2018 05/20/2018 05/18/2018  WBC 3.6 - 11.0 K/uL 15.3(H) 14.2(H) 16.7(H)  Hemoglobin 12.0 - 16.0 g/dL 8.8(L) 8.8(L) 10.1(L)  Hematocrit 35.0 - 47.0 % 28.4(L) 27.7(L) 31.3(L)  Platelets 150 - 440 K/uL 388 492(H) 599(H)    CXR: No new film  IMPRESSION: Status asthmaticus, resolving Anxiety S/P extubation, tolerated No convincing evidence of acute bacterial infection  PLAN/REC: Monitor in SDU through today Continue nebulized steroids and bronchodilators Continue systemic steroids, dose reduced 8/12 Advance diet and activity Discontinue antibiotic Change alprazolam to as needed only Discontinue risperidone  Patient and husband updated at bedside in detail She will need follow-up with pulmonary office after discharge  Merton Border, MD PCCM service Mobile [phone] Pager [phone] 05/21/2018 4:44 PM

## 2018-05-21 NOTE — Progress Notes (Signed)
Both arms assessed with ultrasound.  Veins small and deep.  Unable to dain PIV

## 2018-05-21 NOTE — Progress Notes (Signed)
[place] [facility] [place] NAME: Jasmine Schultz    MR#:  [id number]  DATE OF BIRTH:  [birth year]  SUBJECTIVE:  CHIEF COMPLAINT:   Chief Complaint  Patient presents with  . Shortness of Breath   -Admitted with asthma exacerbation, intubated on presentation.  Self extubated last evening -Doing well on 1 L oxygen.  Still remains on Precedex drip due to confusion  REVIEW OF SYSTEMS:  Review of Systems  Unable to perform ROS: Critical illness    DRUG ALLERGIES:   Allergies  Allergen Reactions  . Tomato Anaphylaxis and Rash  . Chocolate   . Oysters [Shellfish Allergy] Hives  . Peanut Butter Flavor     Feels congested    VITALS:  Blood pressure 130/76, pulse 84, temperature 97.8 F (36.6 C), temperature source Axillary, resp. rate (!) 25, height 5\' 8"  (1.727 m), weight (!) 144.8 kg, SpO2 99 %.  PHYSICAL EXAMINATION:  Physical Exam   GENERAL:  42 y.o.-year-old obese patient lying in the bed with no acute distress.  Appears confused EYES: Pupils equal, round, reactive to light and accommodation. No scleral icterus.  Bloody conjunctiva on both eyes.  Extraocular muscles intact.  HEENT: Head atraumatic, normocephalic. Oropharynx and nasopharynx clear.  NECK:  Supple, no jugular venous distention. No thyroid enlargement, no tenderness.  LUNGS: Normal breath sounds bilaterally, no wheezing, rales,rhonchi or crepitation. No use of accessory muscles of respiration.  Decreased bibasilar breath sounds CARDIOVASCULAR: S1, S2 normal. No murmurs, rubs, or gallops.  ABDOMEN: Soft, nontender, nondistended. Bowel sounds present. No organomegaly or mass.  EXTREMITIES: No pedal edema, cyanosis, or clubbing.  NEUROLOGIC: Cranial nerves II through XII are intact. Muscle strength 5/5 in all extremities. Sensation intact. Gait not checked.  PSYCHIATRIC: The patient is alert, not able to participate in any conversation.  Does not follow simple commands.  Able to  move all extremities in bed. SKIN: No obvious rash, lesion, or ulcer.    LABORATORY PANEL:   CBC Recent Labs  Lab 05/21/18 0500  WBC 15.3*  HGB 8.8*  HCT 28.4*  PLT 388   ------------------------------------------------------------------------------------------------------------------  Chemistries  Recent Labs  Lab 05/21/18 0500  NA 143  K 3.9  CL 110  CO2 27  GLUCOSE 112*  BUN 27*  CREATININE 0.70  CALCIUM 8.3*  MG 2.3   ------------------------------------------------------------------------------------------------------------------  Cardiac Enzymes Recent Labs  Lab 05/17/18 1913  TROPONINI <0.03   ------------------------------------------------------------------------------------------------------------------  RADIOLOGY:  Dg Abd 1 View  Result Date: 05/20/2018 CLINICAL DATA:  Evaluate OG tube EXAM: ABDOMEN - 1 VIEW COMPARISON:  May 20, 2018 FINDINGS: The distal tip of the OG tube is in the right mid abdomen, likely in the distal stomach. IMPRESSION: The distal tip of the OG tube is in the right mid abdomen, likely in the distal stomach. The proximal duodenum is a possibility as well. Electronically Signed   By: Dorise Bullion III M.D   On: 05/20/2018 17:55   Ct Angio Chest Pe W Or Wo Contrast  Result Date: 05/19/2018 CLINICAL DATA:  Acute onset of hypoxic respiratory failure. EXAM: CT ANGIOGRAPHY CHEST WITH CONTRAST TECHNIQUE: Multidetector CT imaging of the chest was performed using the standard protocol during bolus administration of intravenous contrast. Multiplanar CT image reconstructions and MIPs were obtained to evaluate the vascular anatomy. CONTRAST:  169mL ISOVUE-370 IOPAMIDOL (ISOVUE-370) INJECTION 76% COMPARISON:  Chest radiograph performed 05/18/2018 FINDINGS: Cardiovascular: Evaluation is markedly suboptimal due to limitations in the timing of the contrast bolus. No  central pulmonary embolus is seen. However, the study is essentially nondiagnostic  for pulmonary embolus. The heart is borderline normal in size. The thoracic aorta is grossly unremarkable. The great vessels are within normal limits. Mediastinum/Nodes: The mediastinum is grossly unremarkable. No mediastinal lymphadenopathy is seen. No pericardial effusion is identified. The visualized portions of the thyroid gland are grossly unremarkable. No axillary lymphadenopathy is seen. The patient's endotracheal tube is seen ending 3 cm above the carina. An enteric tube is noted extending into the stomach. Lungs/Pleura: Mild left basilar airspace opacity likely reflects atelectasis. Some of this is nodular in appearance. No pleural effusion or pneumothorax is identified. No masses are identified. Upper Abdomen: The visualized portions of the liver and spleen are unremarkable. The visualized portions of the gallbladder, pancreas, adrenal glands and kidneys are grossly unremarkable. Musculoskeletal: No acute osseous abnormalities are identified. The visualized musculature is unremarkable in appearance. Review of the MIP images confirms the above findings. IMPRESSION: 1. Evaluation is markedly suboptimal due to limitations in the timing of the contrast bolus. No central pulmonary embolus seen. However, the study is essentially nondiagnostic for pulmonary embolus. 2. Mild left basilar airspace opacity likely reflects atelectasis. Some of this is nodular in appearance. Lungs otherwise clear. Electronically Signed   By: Garald Balding M.D.   On: 05/19/2018 21:27   Dg Chest Port 1 View  Result Date: 05/20/2018 CLINICAL DATA:  ETT dislodged.  Re-intubation. EXAM: PORTABLE CHEST 1 VIEW COMPARISON:  May 20, 2018 FINDINGS: The ETT terminates 2.5 cm above the carina in good position. The NG tube terminates below today's film. No pneumothorax. The lungs are clear. No other acute abnormalities. Stable cardiomegaly. IMPRESSION: The ETT is in good position.  No other interval change. Electronically Signed   By:  Dorise Bullion III M.D   On: 05/20/2018 17:55   Dg Chest Port 1 View  Result Date: 05/20/2018 CLINICAL DATA:  Atelectasis, history of asthma EXAM: PORTABLE CHEST 1 VIEW COMPARISON:  CTA chest dated 05/19/2018 FINDINGS: Endotracheal tube terminates 4.5 cm above the carina. Mild left basilar opacity, likely atelectasis. Right lung is clear. No pleural effusion or pneumothorax. The heart is top-normal in size/mildly enlarged. Enteric tube courses into the stomach. IMPRESSION: Endotracheal tube terminates 4.5 cm above the carina. Mild left basilar opacity, likely atelectasis. Electronically Signed   By: Julian Hy M.D.   On: 05/20/2018 08:04   Dg Abd Portable 1v  Result Date: 05/20/2018 CLINICAL DATA:  Orogastric tube placement. EXAM: PORTABLE ABDOMEN - 1 VIEW COMPARISON:  05/17/2018 FINDINGS: Orogastric tube tip extends well into the distal stomach based on radiographic course. Visualized bowel gas pattern is unremarkable. IMPRESSION: Orogastric tube tip extends into the distal stomach. Electronically Signed   By: Aletta Edouard M.D.   On: 05/20/2018 10:06   [place] Ekg Site Rite  Result Date: 05/20/2018 If Site Rite image not attached, placement could not be confirmed due to current cardiac rhythm.   EKG:   Orders placed or performed during the hospital encounter of 05/17/18  . ED EKG within 10 minutes  . ED EKG within 10 minutes    ASSESSMENT AND PLAN:   42 year old female with past medical history signet, allergy presents to hospital secondary to acute shortness of breath.  1.  Acute hypoxic respiratory failure-secondary to asthma exacerbation -Intubated on admission -Self extubated on 05/20/2018.  Currently on 1 L oxygen. -Much improving -On steroids, inhalers and nebulizers. -Prophylactic azithromycin and azithromycin  2.  Acute encephalopathy-secondary to hypoxia and presentation.  No focal deficits. -Continue Precedex and wean as tolerated.  3.  DVT prophylaxis-on  subcutaneous heparin   Updated mother at bedside.   All the records are reviewed and case discussed with Care Management/Social Workerr. Management plans discussed with the patient, family and they are in agreement.  CODE STATUS: Full code  TOTAL TIME TAKING CARE OF THIS PATIENT: 37 minutes.   POSSIBLE D/C IN 1-2 DAYS, DEPENDING ON CLINICAL CONDITION.   Gladstone Lighter M.D on 05/21/2018 at 9:27 AM  Between 7am to 6pm - Pager - [phone]  After 6pm go to [email or web address] - password [email or web address] [facility]  Office  [phone]  CC: Primary care physician; Glendale Chard, MD

## 2018-05-22 ENCOUNTER — Telehealth: Payer: Self-pay | Admitting: Pulmonary Disease

## 2018-05-22 LAB — RETICULOCYTES
RBC.: 3.58 MIL/uL — ABNORMAL LOW (ref 3.80–5.20)
RETIC COUNT ABSOLUTE: 71.6 10*3/uL (ref 19.0–183.0)
Retic Ct Pct: 2 % (ref 0.4–3.1)

## 2018-05-22 LAB — IRON AND TIBC
IRON: 56 ug/dL (ref 28–170)
SATURATION RATIOS: 15 % (ref 10.4–31.8)
TIBC: 372 ug/dL (ref 250–450)
UIBC: 316 ug/dL

## 2018-05-22 LAB — CALCIUM, IONIZED
CALCIUM, IONIZED, SERUM: 4.8 mg/dL (ref 4.5–5.6)
CALCIUM, IONIZED, SERUM: 4.8 mg/dL (ref 4.5–5.6)
Calcium, Ionized, Serum: 4.8 mg/dL (ref 4.5–5.6)
Calcium, Ionized, Serum: 4.8 mg/dL (ref 4.5–5.6)

## 2018-05-22 LAB — GLUCOSE, CAPILLARY
GLUCOSE-CAPILLARY: 80 mg/dL (ref 70–99)
GLUCOSE-CAPILLARY: 72 mg/dL (ref 70–99)
Glucose-Capillary: 110 mg/dL — ABNORMAL HIGH (ref 70–99)
Glucose-Capillary: 83 mg/dL (ref 70–99)

## 2018-05-22 LAB — FERRITIN: FERRITIN: 10 ng/mL — AB (ref 11–307)

## 2018-05-22 LAB — FOLATE: FOLATE: 8.9 ng/mL (ref >5.9)

## 2018-05-22 LAB — VITAMIN B12: Vitamin B-12: 361 pg/mL (ref 180–914)

## 2018-05-22 MED ORDER — ONDANSETRON HCL 4 MG/2ML IJ SOLN: 4.0000 mg | Freq: Four times a day (QID) | INTRAMUSCULAR | Status: DC | PRN

## 2018-05-22 MED ORDER — POLYVINYL ALCOHOL 1.4 % OP SOLN
1.0000 [drp] | Freq: Four times a day (QID) | OPHTHALMIC | Status: DC | PRN
  Administered 2018-05-22 – 2018-05-23 (×3): 1 [drp] via OPHTHALMIC
  Filled 2018-05-22 (×2): qty 15

## 2018-05-22 MED ORDER — PREDNISONE 20 MG PO TABS
40.0000 mg | ORAL_TABLET | Freq: Every day | ORAL | Status: DC
  Administered 2018-05-23: 40 mg via ORAL
  Filled 2018-05-22: qty 2

## 2018-05-22 MED ORDER — HYPROMELLOSE (GONIOSCOPIC) 2.5 % OP SOLN: 1.0000 [drp] | Freq: Four times a day (QID) | OPHTHALMIC | Status: DC | PRN

## 2018-05-22 MED ORDER — ONDANSETRON HCL 4 MG PO TABS: 4.0000 mg | ORAL_TABLET | Freq: Four times a day (QID) | ORAL | Status: DC | PRN

## 2018-05-22 MED ORDER — IPRATROPIUM-ALBUTEROL 0.5-2.5 (3) MG/3ML IN SOLN
3.0000 mL | Freq: Four times a day (QID) | RESPIRATORY_TRACT | Status: DC
  Administered 2018-05-22 – 2018-05-23 (×3): 3 mL via RESPIRATORY_TRACT
  Filled 2018-05-22 (×5): qty 3

## 2018-05-22 MED ORDER — DOXYCYCLINE HYCLATE 100 MG PO TABS
100.0000 mg | ORAL_TABLET | Freq: Two times a day (BID) | ORAL | Status: DC
  Administered 2018-05-22 – 2018-05-23 (×2): 100 mg via ORAL
  Filled 2018-05-22 (×2): qty 1

## 2018-05-22 MED ORDER — DOXYCYCLINE HYCLATE 100 MG PO TABS
100.0000 mg | ORAL_TABLET | Freq: Two times a day (BID) | ORAL | Status: DC
  Administered 2018-05-22: 100 mg via ORAL
  Filled 2018-05-22: qty 1

## 2018-05-22 MED ORDER — MAGNESIUM HYDROXIDE 400 MG/5ML PO SUSP
30.0000 mL | Freq: Every day | ORAL | Status: DC | PRN
  Administered 2018-05-22: 30 mL via ORAL
  Filled 2018-05-22: qty 30

## 2018-05-22 NOTE — Progress Notes (Signed)
Patient was transferred from CCU to room 156. A&O x4. Flat affect, soft voice, blood shot eyes. Foley in place. Oriented to room, call light, TV and bed controls.  Family at bedside

## 2018-05-22 NOTE — Telephone Encounter (Signed)
lmov to schedule fu  °

## 2018-05-22 NOTE — Progress Notes (Signed)
After going to the bathroom Nurse tech noted patient had 3 small clots with bright red blood in the BSC. RN notified and went to the room.Patient states that she still has periods, but she cant remember when the last time she had a period. Patient denies pain or any cramping. MD paged. Will continue to monitor.

## 2018-05-22 NOTE — Progress Notes (Signed)
This RN called into the room, because family stated patient was having an anxiety attack. Went into room to find patient sitting in the chair, eyes closed,  arms and feet still at side. Talked to patient and she whispered that she was having an anxiety attach and needed something. Gave patient Xanax as ordered with with good relief.

## 2018-05-22 NOTE — Progress Notes (Signed)
svn given. Patient tolerated well. bbs essentially clear. Patient has flutter valve at bedside. She asked me how to use it. Instructed patient on such. Patient tolerated well.

## 2018-05-22 NOTE — Progress Notes (Addendum)
Pt transferred to room 156 via her recliner.  Pt and family agreeable to transfer, VSS on room air, report called to [facility], meds and chart sent with patient, no personal belongings in room.  Pt declined to have her foley catheter discontinued this morning.

## 2018-05-22 NOTE — Progress Notes (Signed)
[place] [facility] [place] NAME: Jasmine Schultz    MR#:  [id number]  DATE OF BIRTH:  1976/04/18  SUBJECTIVE:  CHIEF COMPLAINT:   Chief Complaint  Patient presents with  . Shortness of Breath   -Extubated, remains on room air. -Complaints of throat pain secondary to intubation  REVIEW OF SYSTEMS:  Review of Systems  Constitutional: Negative for chills, fever and malaise/fatigue.  HENT: Positive for sore throat. Negative for congestion, ear discharge, hearing loss and nosebleeds.   Eyes: Negative for blurred vision and double vision.  Respiratory: Positive for cough and shortness of breath. Negative for wheezing.   Cardiovascular: Negative for chest pain, palpitations and leg swelling.  Gastrointestinal: Negative for abdominal pain, constipation, diarrhea, nausea and vomiting.  Genitourinary: Negative for dysuria.  Musculoskeletal: Negative for myalgias.  Neurological: Negative for dizziness, speech change, focal weakness, seizures, weakness and headaches.  Psychiatric/Behavioral: Negative for depression.    DRUG ALLERGIES:   Allergies  Allergen Reactions  . Tomato Anaphylaxis and Rash  . Chocolate   . Oysters [Shellfish Allergy] Hives  . Peanut Butter Flavor     Feels congested    VITALS:  Blood pressure (!) 165/98, pulse 80, temperature 97.9 F (36.6 C), temperature source Oral, resp. rate (!) 22, height 5\' 8"  (1.727 m), weight (!) 144.8 kg, SpO2 98 %.  PHYSICAL EXAMINATION:  Physical Exam   GENERAL:  42 y.o.-year-old obese patient lying in the bed with no acute distress.  EYES: Pupils equal, round, reactive to light and accommodation. No scleral icterus.  Bloody conjunctiva on both eyes.  Extraocular muscles intact.  HEENT: Head atraumatic, normocephalic. Oropharynx and nasopharynx clear.  NECK:  Supple, no jugular venous distention. No thyroid enlargement, no tenderness.  LUNGS: Normal breath sounds bilaterally, no wheezing,  rales,rhonchi or crepitation. No use of accessory muscles of respiration.  Decreased bibasilar breath sounds CARDIOVASCULAR: S1, S2 normal. No murmurs, rubs, or gallops.  ABDOMEN: Soft, nontender, nondistended. Bowel sounds present. No organomegaly or mass.  EXTREMITIES: No pedal edema, cyanosis, or clubbing.  NEUROLOGIC: Cranial nerves II through XII are intact. Muscle strength 5/5 in all extremities. Sensation intact. Gait not checked.  PSYCHIATRIC: The patient is alert, seems oriented x 2-3 SKIN: No obvious rash, lesion, or ulcer.    LABORATORY PANEL:   CBC Recent Labs  Lab 05/21/18 0500  WBC 15.3*  HGB 8.8*  HCT 28.4*  PLT 388   ------------------------------------------------------------------------------------------------------------------  Chemistries  Recent Labs  Lab 05/21/18 0500  NA 143  K 3.9  CL 110  CO2 27  GLUCOSE 112*  BUN 27*  CREATININE 0.70  CALCIUM 8.3*  MG 2.3   ------------------------------------------------------------------------------------------------------------------  Cardiac Enzymes Recent Labs  Lab 05/17/18 1913  TROPONINI <0.03   ------------------------------------------------------------------------------------------------------------------  RADIOLOGY:  Dg Abd 1 View  Result Date: 05/20/2018 CLINICAL DATA:  Evaluate OG tube EXAM: ABDOMEN - 1 VIEW COMPARISON:  May 20, 2018 FINDINGS: The distal tip of the OG tube is in the right mid abdomen, likely in the distal stomach. IMPRESSION: The distal tip of the OG tube is in the right mid abdomen, likely in the distal stomach. The proximal duodenum is a possibility as well. Electronically Signed   By: Dorise Bullion III M.D   On: 05/20/2018 17:55   Dg Chest Port 1 View  Result Date: 05/20/2018 CLINICAL DATA:  ETT dislodged.  Re-intubation. EXAM: PORTABLE CHEST 1 VIEW COMPARISON:  May 20, 2018 FINDINGS: The ETT terminates 2.5 cm above the carina  in good position. The NG tube  terminates below today's film. No pneumothorax. The lungs are clear. No other acute abnormalities. Stable cardiomegaly. IMPRESSION: The ETT is in good position.  No other interval change. Electronically Signed   By: Dorise Bullion III M.D   On: 05/20/2018 17:55   [place] Ekg Site Rite  Result Date: 05/20/2018 If Site Rite image not attached, placement could not be confirmed due to current cardiac rhythm.   EKG:   Orders placed or performed during the hospital encounter of 05/17/18  . ED EKG within 10 minutes  . ED EKG within 10 minutes    ASSESSMENT AND PLAN:   42 year old female with past medical history signet, allergy presents to hospital secondary to acute shortness of breath.  1.  Acute hypoxic respiratory failure-secondary to asthma exacerbation -Intubated on admission -Self extubated on 05/20/2018.  Currently on room air today. -Much improved -On steroids, inhalers and nebulizers. -Prophylactic doxycyline  2.  Acute encephalopathy-secondary to hypoxia and presentation.  No focal deficits. -Off sedation.  Xanax as needed for anxiety.  Mental status improved and back to baseline.  3.  DVT prophylaxis-on subcutaneous heparin   Updated mother at bedside.  Encourage ambulation today. PT consulted    All the records are reviewed and case discussed with Care Management/Social Workerr. Management plans discussed with the patient, family and they are in agreement.  CODE STATUS: Full code  TOTAL TIME TAKING CARE OF THIS PATIENT: 33 minutes.   POSSIBLE D/C IN 1-2 DAYS, DEPENDING ON CLINICAL CONDITION.   Gladstone Lighter M.D on 05/22/2018 at 1:43 PM  Between 7am to 6pm - Pager - [phone]  After 6pm go to [email or web address] - password [email or web address] [facility]  Office  [phone]  CC: Primary care physician; Glendale Chard, MD

## 2018-05-22 NOTE — Progress Notes (Signed)
Nutrition Follow-up  DOCUMENTATION CODES:   Morbid obesity  INTERVENTION:  Recommend liberalizing diet to regular. Patient does not have a history of heart disease or diabetes and it will be difficult to meet her estimated kcal/protein needs on a heart healthy/carbohydrate modified diet. She also has multiple food allergies.  Patient has refused high-protein ONS recommended by RD.  Recommend bowel regimen as there is no documented bowel movement this admission.  NUTRITION DIAGNOSIS:   Inadequate oral intake related to inability to eat as evidenced by NPO status.  Resolving as diet is now advanced. Though patient did not eat adequately this AM.  GOAL:   Patient will meet greater than or equal to 90% of their needs  Progressing.  MONITOR:   TF tolerance, I & O's, Labs, Weight trends  REASON FOR ASSESSMENT:   Consult Enteral/tube feeding initiation and management  ASSESSMENT:   42 year old female with PMHx of IBS, GERD, anxiety, asthma admitted with acute respiratory failure secondary to acute exacerbation of bronchial asthma requiring intubation 8/8, PNA with possible aspiration, sepsis, AKI, anxiety with panic attack.   -Patient self-extubated on 8/11. -Diet was advanced to heart healthy/carbohydrate modified on afternoon of 8/12.  Met with patient and her family members at bedside. Patient seemed sleepy and she also talked in a whisper so it was difficult to understand her. She reports her appetite is not good today. She only had some eggs and juice for breakfast this AM. After discussion on importance of meeting calorie/protein needs after critical illness to prevent loss of lean body mass, patient refusing any oral nutrition supplements. Noted patient is allergic to tomatoes, chocolate, oysters/shellfish, and peanut butter. Still no BM this admission. There is no history of heart disease or diabetes. It will be very difficult for patient to meet her calorie/protein needs on  a heart healthy/carbohydrate modified diet so recommend liberalizing.  Meal Completion: 50% of breakfast this morning  Medications reviewed and include: doxycycline, Solu-Medrol 40 mg Q12hrs IV for today, prednisone 40 mg daily starting tomorrow.  Labs reviewed: CBG 71-110. On 8/12 BUN 27, ionized calcium WNL at 4.8. On 8/11 triglycerides 155. HgbA1c 5.5 on 05/18/2018.  I/O: 1550 mL UOP yesterday (0.4 mL/kg/hr)  Weight trend: 144.8 kg on 8/11; +3.9 kg this admission  Patient does not meet criteria for malnutrition.  Discussed with RN and on rounds.  Diet Order:   Diet Order            Diet heart healthy/carb modified Room service appropriate? Yes; Fluid consistency: Thin  Diet effective now              EDUCATION NEEDS:   No education needs have been identified at this time  Skin:  Skin Assessment: Reviewed RN Assessment  Last BM:  05/17/2018  Height:   Ht Readings from Last 1 Encounters:  05/17/18 5' 8"  (1.727 m)    Weight:   Wt Readings from Last 1 Encounters:  05/20/18 (!) 144.8 kg    Ideal Body Weight:  63.63 kg  BMI:  Body mass index is 48.54 kg/m.  Estimated Nutritional Needs:   Kcal:  5409-8119 (MSJ x 1.1-1.2)  Protein:  >125 grams (0.9 grams/kg)  Fluid:  1.9-2.2 L/day (30-35 mL/kg IBW)  Willey Blade, MS, RD, LDN Office: [phone] Pager: [phone] After Hours/Weekend Pager: [phone]

## 2018-05-22 NOTE — Telephone Encounter (Signed)
-----   Message from [facility] [place], [place] sent at 8/34/7583  9:50 AM EDT ----- Please schedule pt in a 30 min slot for hosp f/u for hypoxia with Dr. Alva Garnet in 3-4 weeks.  Thanks, Misty ----- Message ----- From: Wilhelmina Mcardle, MD Sent: 05/22/2018   9:21 AM EDT To: Renelda Mom, LPN  Schedule office follow up with me in 3-4 weeks. May open up an extra slot if needed  Thanks  Waunita Schooner

## 2018-05-22 NOTE — Progress Notes (Signed)
Pt lost IV access overnight and had to have midline placed for access. Pt had mother at bedside and was very unstable on her deet when ambulated from chair to bed via 3-person assist.

## 2018-05-22 NOTE — Progress Notes (Signed)
Hospitalist returned paged. No orders received at this time. We will continue to monitor bleeding.

## 2018-05-22 NOTE — Evaluation (Signed)
Physical Therapy Evaluation Patient Details Name: Jasmine Schultz MRN: [id number] DOB: August 03, 1976 Today's Date: 05/22/2018   History of Present Illness  42 y/o female here with acute respiratory failure.  Pt needed to be intubated, but self extubated yesterday and has been transferred off CCU.  Clinical Impression  Pt still feeling very weak from time in CCU and being intubated but was willing to participate with PT exam and despite some lethargy and inability to maintain open eyes secondary to discomfort she did surprisingly well.  Though she was very slow, labored and needed a lot of cuing/encouragement with gait training (~15 minutes apart from the exam) she ultimately did manage to complete the entire loop (multiple rest breaks, constant cuing, etc).  Overall she remains very far from her baseline but showed ability to be able to manage at home with the help that family will be able to give.  She will require HHPT and further in-house PT/stair training.      Follow Up Recommendations Home health PT    Equipment Recommendations  Rolling walker with 5" wheels    Recommendations for Other Services       Precautions / Restrictions Restrictions Weight Bearing Restrictions: No      Mobility  Bed Mobility               General bed mobility comments: Pt in recliner on arrival, returned to recliner, not tested  Transfers Overall transfer level: Needs assistance Equipment used: Rolling walker (2 wheeled) Transfers: Sit to/from Stand Sit to Stand: Min assist         General transfer comment: Pt showed good effort in getting to standing but did ultimately needed light assist to get to fully upright  Ambulation/Gait Ambulation/Gait assistance: Min guard Gait Distance (Feet): 200 Feet Assistive device: Rolling walker (2 wheeled)       General Gait Details: Pt with very slow, labored ambulation with multiple brief standing rest breaks, need for a lot of cuing and encouragment  t/o the prolonged bout of gait trainging but ultimately safe and steady.  She did have slight increase in HR to ~120 and drop in O2 to high 80s though both numbers remained fairly stable and WNL for the first 100-150 ft.  Stairs            Wheelchair Mobility    Modified Rankin (Stroke Patients Only)       Balance Overall balance assessment: Needs assistance   Sitting balance-Leahy Scale: Good Sitting balance - Comments: no issues with static sitting apart from feeling poorly   Standing balance support: Bilateral upper extremity supported Standing balance-Leahy Scale: Fair Standing balance comment: highly relaint on walker/UEs to insure safety in standing.  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     Hand Dominance  Extremity/Trunk Assessment   Upper Extremity Assessment Upper Extremity Assessment: Generalized weakness(unable to fully elevate shoulder overhead, grossly 3/5)    Lower Extremity Assessment Lower Extremity Assessment: Generalized weakness(grossly 3+/5, slow and labored with activity)       Communication   Communication: (very soft voice and unable to open eyes comfortably)  Cognition Arousal/Alertness: Lethargic Behavior During Therapy: Anxious;WFL for tasks assessed/performed Overall Cognitive Status: Within Functional Limits for tasks assessed                                         General Comments      Exercises     Assessment/Plan    PT Assessment Patient needs continued PT services  PT Problem List Decreased strength;Decreased range of motion;Decreased activity tolerance;Decreased balance;Decreased mobility;Decreased coordination;Decreased safety awareness;Decreased knowledge of use of DME;Pain;Decreased knowledge of precautions       PT Treatment Interventions DME instruction;Gait training;Stair training;Functional mobility training;Therapeutic activities;Therapeutic exercise;Balance training;Neuromuscular re-education;Patient/family education    PT Goals (Current goals can be found in the Care Plan section)  Acute Rehab PT Goals Patient Stated Goal: get sttronger PT Goal Formulation: With patient Time For Goal Achievement: 06/05/18 Potential to Achieve Goals: Fair    Frequency Min 2X/week   Barriers to discharge        Co-evaluation               AM-PAC PT "6 Clicks" Daily Activity  Outcome Measure Difficulty turning over in bed (including adjusting bedclothes, sheets and blankets)?: A Lot Difficulty moving from lying on back to sitting on the side of the bed? : Unable Difficulty sitting down on and standing up from a chair with arms (e.g., wheelchair, bedside commode, etc,.)?: Unable Help needed moving to and from a bed to chair (including a wheelchair)?: A Little Help needed walking in hospital room?: A Little Help needed climbing 3-5 steps with a railing? : A Lot 6 Click Score: 12    End of Session Equipment Utilized During Treatment: Gait belt Activity Tolerance: Patient limited by fatigue Patient left: in chair;with call bell/phone within reach;with family/visitor present Nurse Communication: Mobility status PT Visit Diagnosis: Muscle weakness (generalized) (M62.81);Difficulty in walking, not elsewhere classified (R26.2)    Time: 8832-5498 PT Time Calculation (min) (ACUTE ONLY): 30  min   Charges:   PT Evaluation $PT Eval Low Complexity: 1 Low PT Treatments $Gait Training: 8-22 mins        Kreg Shropshire, DPT 05/22/2018, 2:55 PM

## 2018-05-22 NOTE — Progress Notes (Signed)
No distress on room air No new complaints Voice is still weak but improving Rattling cough, marginally effective Mucopurulent secretions Complains of throat pain Denies chest pain and dyspnea  Vitals:   05/22/18 0900 05/22/18 1000 05/22/18 1100 05/22/18 1126  BP:  (!) 110/94 (!) 147/93 (!) 165/98  Pulse: 85 95 85 80  Resp: (!) 22 (!) 28 (!) 26 (!) 22  Temp:    97.9 F (36.6 C)  TempSrc:    Oral  SpO2: 100% 99% 98% 98%  Weight:      Height:         Gen: NAD whisper quality to voice HEENT: NCAT, sclera white Neck: No JVD Lungs: Scattered wheezes, scattered rhonchi Cardiovascular: RRR, no murmurs Abdomen: Obese, soft, nontender, normal BS Ext: without clubbing, cyanosis, edema Neuro: grossly intact Skin: Limited exam, no lesions noted  BMP Latest Ref Rng & Units 05/21/2018 05/20/2018 05/19/2018  Glucose 70 - 99 mg/dL 112(H) 136(H) 133(H)  BUN 6 - 20 mg/dL 27(H) 23(H) 21(H)  Creatinine 0.44 - 1.00 mg/dL 0.70 0.89 0.84  Sodium 135 - 145 mmol/L 143 145 144  Potassium 3.5 - 5.1 mmol/L 3.9 4.6 4.4  Chloride 98 - 111 mmol/L 110 112(H) 112(H)  CO2 22 - 32 mmol/L 27 29 27   Calcium 8.9 - 10.3 mg/dL 8.3(L) 8.5(L) 8.4(L)    CBC Latest Ref Rng & Units 05/21/2018 05/20/2018 05/18/2018  WBC 3.6 - 11.0 K/uL 15.3(H) 14.2(H) 16.7(H)  Hemoglobin 12.0 - 16.0 g/dL 8.8(L) 8.8(L) 10.1(L)  Hematocrit 35.0 - 47.0 % 28.4(L) 27.7(L) 31.3(L)  Platelets 150 - 440 K/uL 388 492(H) 599(H)    No new chest x-ray  IMPRESSION: Resolving status asthmaticus Hoarseness due to self extubation Overlying anxiety disorder and poor motivation  PLAN/REC: Transfer to MedSurg floor today Continue nebulized steroids and bronchodilators Continue methylprednisolone through today Initiate prednisone 40 mg daily x5 days beginning tomorrow (8/14) Flutter valve initiated 8/13 Doxycycline x5 days initiated 8/13 I have scheduled follow-up with me in the office in 3 to 4 weeks Upon discharge, she should complete  prednisone and doxycycline as recommended above Upon discharge, she will need ICS/LABA as maintenance medication Upon discharge, she should continue pro-air inhaler as her rescue medication She should not get a nebulizer at the time of discharge!!!!  PCCM will continue to follow  Merton Border, MD PCCM service Mobile [phone] Pager [phone] 05/22/2018 3:02 PM

## 2018-05-23 LAB — BASIC METABOLIC PANEL
ANION GAP: 7 (ref 5–15)
BUN: 21 mg/dL — ABNORMAL HIGH (ref 6–20)
CALCIUM: 8.5 mg/dL — AB (ref 8.9–10.3)
CHLORIDE: 110 mmol/L (ref 98–111)
CO2: 21 mmol/L — AB (ref 22–32)
Creatinine, Ser: 0.53 mg/dL (ref 0.44–1.00)
GFR calc Af Amer: >60 mL/min (ref >60)
GFR calc non Af Amer: >60 mL/min (ref >60)
GLUCOSE: 83 mg/dL (ref 70–99)
Potassium: 3.9 mmol/L (ref 3.5–5.1)
Sodium: 138 mmol/L (ref 135–145)

## 2018-05-23 LAB — CBC
HEMATOCRIT: 29.8 % — AB (ref 35.0–47.0)
Hemoglobin: 9.8 g/dL — ABNORMAL LOW (ref 12.0–16.0)
MCH: 26 pg (ref 26.0–34.0)
MCHC: 33 g/dL (ref 32.0–36.0)
MCV: 78.9 fL — AB (ref 80.0–100.0)
Platelets: 443 10*3/uL — ABNORMAL HIGH (ref 150–440)
RBC: 3.78 MIL/uL — ABNORMAL LOW (ref 3.80–5.20)
RDW: 15.7 % — AB (ref 11.5–14.5)
WBC: 14.8 10*3/uL — AB (ref 3.6–11.0)

## 2018-05-23 LAB — CULTURE, BLOOD (ROUTINE X 2)
CULTURE: NO GROWTH
CULTURE: NO GROWTH
SPECIAL REQUESTS: ADEQUATE
Special Requests: ADEQUATE

## 2018-05-23 LAB — GLUCOSE, CAPILLARY: Glucose-Capillary: 89 mg/dL (ref 70–99)

## 2018-05-23 MED ORDER — IBUPROFEN 400 MG PO TABS
400.0000 mg | ORAL_TABLET | Freq: Once | ORAL | Status: AC
  Administered 2018-05-23: 400 mg via ORAL
  Filled 2018-05-23: qty 1

## 2018-05-23 MED ORDER — POLYVINYL ALCOHOL 1.4 % OP SOLN: 1.0000 [drp] | Freq: Four times a day (QID) | OPHTHALMIC | 0 refills | Status: DC

## 2018-05-23 MED ORDER — PREDNISONE 20 MG PO TABS: 40.0000 mg | ORAL_TABLET | Freq: Every day | ORAL | 0 refills | Status: DC

## 2018-05-23 MED ORDER — BENZONATATE 200 MG PO CAPS: 200.0000 mg | ORAL_CAPSULE | Freq: Three times a day (TID) | ORAL | 0 refills | Status: DC

## 2018-05-23 MED ORDER — BUDESONIDE-FORMOTEROL FUMARATE 160-4.5 MCG/ACT IN AERO: 2.0000 | INHALATION_SPRAY | Freq: Two times a day (BID) | RESPIRATORY_TRACT | 12 refills | Status: DC

## 2018-05-23 MED ORDER — DOXYCYCLINE HYCLATE 100 MG PO TABS: 100.0000 mg | ORAL_TABLET | Freq: Two times a day (BID) | ORAL | 0 refills | Status: DC

## 2018-05-23 MED ORDER — PREDNISONE 20 MG PO TABS: 40.0000 mg | ORAL_TABLET | Freq: Every day | ORAL | 0 refills | Status: AC

## 2018-05-23 MED ORDER — POLYVINYL ALCOHOL 1.4 % OP SOLN: 1.0000 [drp] | Freq: Four times a day (QID) | OPHTHALMIC | 0 refills | Status: DC | PRN

## 2018-05-23 MED ORDER — BENZONATATE 200 MG PO CAPS: 200.0000 mg | ORAL_CAPSULE | Freq: Three times a day (TID) | ORAL | 0 refills | Status: AC

## 2018-05-23 MED ORDER — BENZONATATE 100 MG PO CAPS
200.0000 mg | ORAL_CAPSULE | Freq: Three times a day (TID) | ORAL | Status: DC
  Administered 2018-05-23: 200 mg via ORAL
  Filled 2018-05-23: qty 2

## 2018-05-23 NOTE — Care Management (Signed)
Attempted to reach spouse again without success. Spoke with patients mom again and she states she will have him call RNCM.

## 2018-05-23 NOTE — Care Management (Signed)
Spoke with patients spouse. Explained home health to him. He declined all services at this time. I gave him my card for contact information. Case Closed

## 2018-05-23 NOTE — Progress Notes (Signed)
     Jasmine Schultz was admitted to the [facility] on 05/17/2018 for an acute medical condition and is being Discharged on  05/23/2018 .  She will need another 5 days for recovery and so advised to stay away from work until then. So please excuse  her from work  for  The above  Days. Should be able to return to work if feeling better from 05/29/18  Call Gladstone Lighter  MD, [facility] at  [phone] with questions.  Gladstone Lighter M.D on 05/23/2018,at 12:43 PM  [facility] [address] [place] [address]

## 2018-05-23 NOTE — Care Management (Signed)
Met with patient and her mom at bedside. Patient was sitting up at bedside in chair with her eyes closed. She would nod yes or no while I spoke with her mom but didn't not acknowledge RNCM during the conversation.  Patient lives at home with her spouse and mom.  She will need a walker. Ordered from [place] with [facility]. Mom deferred me to the spouse for home health set up. RNCM left message with Yailen Zemaitis at [phone] for return call. Submitted Christella Scheuermann to [facility] for [employer].

## 2018-05-23 NOTE — Discharge Summary (Signed)
[facility] [place] NAME: Jasmine Schultz    MR#:  [id number]  DATE OF BIRTH:  July 08, 1976  DATE OF ADMISSION:  05/17/2018   ADMITTING PHYSICIAN: Epifanio Lesches, MD  DATE OF DISCHARGE: 05/23/2018  2:05 PM  PRIMARY CARE PHYSICIAN: Glendale Chard, MD   ADMISSION DIAGNOSIS:   Anaphylaxis, initial encounter [T78.2XXA]  DISCHARGE DIAGNOSIS:   Active Problems:   Acute respiratory failure ([place])   SECONDARY DIAGNOSIS:   Past Medical History:  Diagnosis Date  . Anxiety attack   . Asthma    diagnosed in march 2019  . Bursitis   . Chronically dry eyes   . GERD (gastroesophageal reflux disease)   . IBS (irritable bowel syndrome)   . Migraine   . Multiple allergies   . Sun allergy   . Tendonitis     HOSPITAL COURSE:   42 year old female with past medical history signet, allergy presents to hospital secondary to acute shortness of breath.  1.  Acute hypoxic respiratory failure-secondary to asthma exacerbation -Intubated on admission -Self extubated on 05/20/2018.  Currently on room air now. -On steroids-discharged on oral prednisone, inhalers and nebulizers. -Prophylactic doxycyline for 5 more days -Clinically improved  2.  Acute encephalopathy-secondary to hypoxia and presentation.  No focal deficits. -Off sedation.  Xanax as needed for anxiety.  Mental status improved and back to baseline.  3.   Anemia of chronic disease-stable.  No indication for transfusion.  4.  Obesity-likely has underlying sleep apnea.  Strongly recommend to get a sleep study done as outpatient.   PT recommended home health.  Family declined home health services at discharge.  DISCHARGE CONDITIONS:   Guarded  CONSULTS OBTAINED:   Treatment Team:  Gladstone Lighter, MD  DRUG ALLERGIES:   Allergies  Allergen Reactions  . Tomato Anaphylaxis and Rash  . Chocolate   . Oysters [Shellfish Allergy] Hives  . Peanut Butter Flavor     Feels  congested   DISCHARGE MEDICATIONS:   Allergies as of 05/23/2018      Reactions   Tomato Anaphylaxis, Rash   Chocolate    Oysters [shellfish Allergy] Hives   Peanut Butter Flavor    Feels congested      Medication List    STOP taking these medications   folic acid 559 MCG tablet Commonly known as:  FOLVITE     TAKE these medications   albuterol 108 (90 Base) MCG/ACT inhaler Commonly known as:  PROVENTIL HFA;VENTOLIN HFA Inhale 2 puffs into the lungs every 6 (six) hours as needed for wheezing or shortness of breath.   B-Complex/B-12 Liqd Place 1 mL under the tongue daily.   benzonatate 200 MG capsule Commonly known as:  TESSALON Take 1 capsule (200 mg total) by mouth 3 (three) times daily for 7 days.   beta carotene 25000 UNIT capsule Take 25,000 Units by mouth daily.   BILBERRY PO Take 250 mg by mouth daily.   Biotin 5 MG Tabs Take 5 mg by mouth daily.   Borage Oil 500 MG Caps Take 500 mg by mouth daily.   Brewers Yeast 500 MG Tabs Take 2,000 mg by mouth daily.   budesonide-formoterol 160-4.5 MCG/ACT inhaler Commonly known as:  SYMBICORT Inhale 2 puffs into the lungs 2 (two) times daily.   Cinnamon 500 MG capsule Take 1,000 mg by mouth daily.   clobetasol cream 0.05 % Commonly known as:  TEMOVATE Apply 1 application topically daily as needed.   Co Q 10  100 MG Caps Take 100 mg by mouth daily.   COLON CLEANSER PO Use 1 time weekly as directed as needed   doxycycline 100 MG tablet Commonly known as:  VIBRA-TABS Take 1 tablet (100 mg total) by mouth 2 (two) times daily.   Echinacea 380 MG Caps Take 760 mg by mouth 2 (two) times daily.   Evening Primrose Oil 1000 MG Caps Take 1,000 mg by mouth daily.   ferrous sulfate 325 (65 FE) MG tablet Take 325 mg by mouth daily.   Fish Oil 1000 MG Caps Take 1,000 mg by mouth daily.   fluticasone 0.05 % cream Commonly known as:  CUTIVATE Apply 1 application topically daily as needed (unknown  indication).   fluticasone 50 MCG/ACT nasal spray Commonly known as:  FLONASE Place 1-2 sprays into both nostrils daily.   Ginkgo Biloba 120 MG Tabs Take 120 mg by mouth daily.   HAIR/SKIN/NAILS PO Take 3 tablets by mouth daily.   Korean Panax Ginseng 100 MG Caps Take 100 mg by mouth daily.   Lecithin 1200 MG Caps Take 1,200 mg by mouth daily.   LIVER & KIDNEY CLEANSER PO Use 1 time weekly as directed as needed   loratadine 10 MG tablet Commonly known as:  CLARITIN Take 10 mg by mouth daily.   Magnesium 250 MG Tabs Take 250 mg by mouth daily.   Melatonin 10 MG Tabs Take 10 mg by mouth at bedtime as needed (sleep).   milk thistle 175 MG tablet Take 175 mg by mouth daily.   montelukast 10 MG tablet Commonly known as:  SINGULAIR Take 1 tablet (10 mg total) by mouth at bedtime.   nystatin powder Generic drug:  nystatin Apply 1 g topically 2 (two) times daily.   ondansetron 4 MG tablet Commonly known as:  ZOFRAN Take 1 tablet (4 mg total) by mouth every 8 (eight) hours as needed for nausea or vomiting.   pantoprazole 40 MG tablet Commonly known as:  PROTONIX Take 1 tablet (40 mg total) by mouth 2 (two) times daily before a meal.   polyvinyl alcohol 1.4 % ophthalmic solution Commonly known as:  LIQUIFILM TEARS Place 1 drop into both eyes 4 (four) times daily.   Potassium 99 MG Tabs Take 99 mg by mouth daily.   predniSONE 20 MG tablet Commonly known as:  DELTASONE Take 2 tablets (40 mg total) by mouth daily with breakfast for 5 days.   prenatal multivitamin Tabs tablet Take 1 tablet by mouth daily.   Red Yeast Rice 600 MG Caps Take 3,000 mg by mouth daily.   selenium 200 MCG Tabs tablet Take 200 mcg by mouth daily.   TRI-LUMA EX Apply 1 application topically at bedtime.   triamcinolone cream 0.1 % Commonly known as:  KENALOG Apply 1 application topically daily as needed (unknown indication).   Turmeric Curcumin 500 MG Caps Take 500 mg by mouth  daily.   Vitamin D-3 1000 units Caps Take 6,000 Units by mouth daily.            Durable Medical Equipment  (From admission, onward)         Start     Ordered   05/23/18 1021  For home use only DME Walker rolling  Once    Question:  Patient needs a walker to treat with the following condition  Answer:  Ataxia   05/23/18 1020           DISCHARGE INSTRUCTIONS:   1. PCP f/u in  1-2 weeks 2. Pulmonary f/u in 2 weeks  DIET:   Regular diet  ACTIVITY:   Activity as tolerated  OXYGEN:   Home Oxygen: No.  Oxygen Delivery: room air  DISCHARGE LOCATION:   home   If you experience worsening of your admission symptoms, develop shortness of breath, life threatening emergency, suicidal or homicidal thoughts you must seek medical attention immediately by calling 911 or calling your MD immediately  if symptoms less severe.  You Must read complete instructions/literature along with all the possible adverse reactions/side effects for all the Medicines you take and that have been prescribed to you. Take any new Medicines after you have completely understood and accpet all the possible adverse reactions/side effects.   Please note  You were cared for by a hospitalist during your hospital stay. If you have any questions about your discharge medications or the care you received while you were in the hospital after you are discharged, you can call the unit and asked to speak with the hospitalist on call if the hospitalist that took care of you is not available. Once you are discharged, your primary care physician will handle any further medical issues. Please note that NO REFILLS for any discharge medications will be authorized once you are discharged, as it is imperative that you return to your primary care physician (or establish a relationship with a primary care physician if you do not have one) for your aftercare needs so that they can reassess your need for medications and monitor  your lab values.    On the day of Discharge:  VITAL SIGNS:   Blood pressure (!) 150/73, pulse (!) 102, temperature 99.5 F (37.5 C), temperature source Axillary, resp. rate 19, height 5\' 8"  (1.727 m), weight (!) 144.8 kg, SpO2 100 %.  PHYSICAL EXAMINATION:    GENERAL:  42 y.o.-year-old obese patient lying in the bed with no acute distress.  EYES: Pupils equal, round, reactive to light and accommodation. No scleral icterus.  Bloody conjunctiva on both eyes.  Extraocular muscles intact.  HEENT: Head atraumatic, normocephalic. Oropharynx and nasopharynx clear.  NECK:  Supple, no jugular venous distention. No thyroid enlargement, no tenderness.  LUNGS: Normal breath sounds bilaterally, no wheezing, rales,rhonchi or crepitation. No use of accessory muscles of respiration.  Decreased bibasilar breath sounds CARDIOVASCULAR: S1, S2 normal. No murmurs, rubs, or gallops.  ABDOMEN: Soft, nontender, nondistended. Bowel sounds present. No organomegaly or mass.  EXTREMITIES: No pedal edema, cyanosis, or clubbing.  NEUROLOGIC: Cranial nerves II through XII are intact. Muscle strength 5/5 in all extremities. Sensation intact. Gait not checked.  PSYCHIATRIC: The patient is alert, seems oriented x 3 SKIN: No obvious rash, lesion, or ulcer.    DATA REVIEW:   CBC Recent Labs  Lab 05/23/18 0256  WBC 14.8*  HGB 9.8*  HCT 29.8*  PLT 443*    Chemistries  Recent Labs  Lab 05/21/18 0500 05/23/18 0256  NA 143 138  K 3.9 3.9  CL 110 110  CO2 27 21*  GLUCOSE 112* 83  BUN 27* 21*  CREATININE 0.70 0.53  CALCIUM 8.3* 8.5*  MG 2.3  --      Microbiology Results  Results for orders placed or performed during the hospital encounter of 05/17/18  MRSA PCR Screening     Status: None   Collection Time: 05/17/18  9:06 PM  Result Value Ref Range Status   MRSA by PCR NEGATIVE NEGATIVE Final    Comment:  The GeneXpert MRSA Assay (FDA approved for NASAL specimens only), is one component of  a comprehensive MRSA colonization surveillance program. It is not intended to diagnose MRSA infection nor to guide or monitor treatment for MRSA infections. Performed at [facility], [address] [place], [place] [address]   CULTURE, BLOOD (ROUTINE X 2) w Reflex to ID Panel     Status: None   Collection Time: 05/18/18 10:00 AM  Result Value Ref Range Status   Specimen Description BLOOD BLOOD RIGHT HAND  Final   Special Requests   Final    BOTTLES DRAWN AEROBIC AND ANAEROBIC Blood Culture adequate volume   Culture   Final    NO GROWTH 5 DAYS Performed at [facility], [address] [place], [place] [address]    Report Status 05/23/2018 FINAL  Final  CULTURE, BLOOD (ROUTINE X 2) w Reflex to ID Panel     Status: None   Collection Time: 05/18/18 10:04 AM  Result Value Ref Range Status   Specimen Description BLOOD LEFT ANTECUBITAL  Final   Special Requests   Final    BOTTLES DRAWN AEROBIC AND ANAEROBIC Blood Culture adequate volume   Culture   Final    NO GROWTH 5 DAYS Performed at [facility], [address] [place], [place] [address]    Report Status 05/23/2018 FINAL  Final  Culture, respiratory (non-expectorated)     Status: None   Collection Time: 05/18/18  3:08 PM  Result Value Ref Range Status   Specimen Description   Final    TRACHEAL ASPIRATE Performed at [facility], [address], [place], [place] [address]    Special Requests   Final    NONE Performed at [facility], [address] [place], [place] [address]    Gram Stain   Final    MODERATE WBC PRESENT, PREDOMINANTLY PMN RARE GRAM POSITIVE COCCI    Culture   Final    FEW Consistent with normal respiratory flora. Performed at [facility], [place] [address], [place], [place] [address]    Report Status 05/21/2018 FINAL  Final    RADIOLOGY:  No results found.   Management plans discussed with the patient, family and they are in  agreement.  CODE STATUS:     Code Status Orders  (From admission, onward)         Start     Ordered   05/17/18 1942  Full code  Continuous     05/17/18 1945        Code Status History    Date Active Date Inactive Code Status Order ID Comments User Context   06/05/2015 1935 06/06/2015 2113 Full Code [id number]  Janece Canterbury, MD Inpatient      TOTAL TIME TAKING CARE OF THIS PATIENT: 38 minutes.    Gladstone Lighter M.D on 05/23/2018 at 3:58 PM  Between 7am to 6pm - Pager - [phone]  After 6pm go to [email or web address] - Proofreader  [facility]  Office  [phone]  CC: Primary care physician; Glendale Chard, MD   Note: This dictation was prepared with Dragon dictation along with smaller phrase technology. Any transcriptional errors that result from this process are unintentional.

## 2018-05-23 NOTE — Progress Notes (Signed)
Patient is being discharged home. Reviewed discharge instructions with spouse. Spent home with eye drops. Scripts sent to the wrong pharmacy, notified MD. Reviewed follow-up, scripts, meds, and last dose taken.  Spouse inquired about follow-up appointment with Dr. Alva Garnet. Called MD and he stated his office will call patient's home with follow-up information. Midline removed by Lonna Duval, RN. Work note sent with patient. Allowed time for questions.

## 2018-05-29 NOTE — Telephone Encounter (Signed)
lmov to schedule fu  °

## 2018-05-31 NOTE — Telephone Encounter (Signed)
Patient scheduled for 06/15/18 with Dr Alva Garnet

## 2018-06-13 DIAGNOSIS — F419 Anxiety disorder, unspecified: Secondary | ICD-10-CM

## 2018-06-13 DIAGNOSIS — Z0182 Encounter for allergy testing: Secondary | ICD-10-CM

## 2018-06-13 DIAGNOSIS — J45909 Unspecified asthma, uncomplicated: Secondary | ICD-10-CM

## 2018-06-15 ENCOUNTER — Encounter: Payer: Self-pay | Admitting: Pulmonary Disease

## 2018-06-15 ENCOUNTER — Ambulatory Visit: Payer: Managed Care, Other (non HMO) | Admitting: Pulmonary Disease

## 2018-06-15 VITALS — BP 136/86 | HR 103 | Ht 68.0 in | Wt 299.0 lb

## 2018-06-15 DIAGNOSIS — J383 Other diseases of vocal cords: Secondary | ICD-10-CM | POA: Diagnosis not present

## 2018-06-15 DIAGNOSIS — J4550 Severe persistent asthma, uncomplicated: Secondary | ICD-10-CM

## 2018-06-15 DIAGNOSIS — E668 Other obesity: Secondary | ICD-10-CM | POA: Diagnosis not present

## 2018-06-15 NOTE — Patient Instructions (Addendum)
Continue Symbicort inhaler, 2 actuations twice a day. Use spacer device (provided). Rinse mouth after use  Continue albuterol as needed  Continue Singulair (Montelukast), Claritin and Flonase as previously prescribed  Continue Protonix as previously prescribed  We discussed the importance of weight loss and strategies to achieve this  PFTs (lung function tests) ordered. We will contact you with the results  Follow up in 8-10 weeks

## 2018-06-17 NOTE — Progress Notes (Signed)
PULMONARY OFFICE FOLLOW UP NOTE  Primary MD: Glendale Chard  Date of initial office visit: 06/15/18  Reason for consultation: Post hospitalization after intubation for status asthmaticus  PT PROFILE: 42 y.o. female never smoker admitted to [facility] 8/8-8/14/19 with status asthmaticus. She was intubated in ED and briefly required epinephrine an ketamine infusions. She self extubated on 8/11.   DATA: 05/19/18 CTA chest: no acute findings except mild dependent atelectasis  INTERVAL:  SUBJ:  Her recent hospitalization has been reviewed by me. She was discharged on prednisone taper and doxycycline. She has completed both of these. She is maintained on Symbicort and is not requiring albuterol rescue MDI. She also takes montelukast, loratidine, pantoprazole and flonase. She is also, notably, on a multitude of herbal supplements.   She has no new complaints and asthma seems very well controlled. She does have chronic problems with hoarseness and intermittent voice loss.     Vitals:   06/15/18 1130 06/15/18 1133  BP:  136/86  Pulse:  (!) 103  SpO2:  99%  Weight: 299 lb (135.6 kg)   Height: 5\' 8"  (1.727 m)   RA   EXAM:  Gen: Obese, hoarse voice quality, NAD HEENT: NCAT, sclera white Neck: No JVD Lungs: breath sounds full, no wheezes or other adventitious sounds Cardiovascular: RRR, no murmurs Abdomen: Soft, nontender, normal BS Ext: without clubbing, cyanosis, edema Neuro: grossly intact Skin: Limited exam, no lesions noted   DATA:   BMP Latest Ref Rng & Units 05/23/2018 05/21/2018 05/20/2018  Glucose 70 - 99 mg/dL 83 112(H) 136(H)  BUN 6 - 20 mg/dL 21(H) 27(H) 23(H)  Creatinine 0.44 - 1.00 mg/dL 0.53 0.70 0.89  Sodium 135 - 145 mmol/L 138 143 145  Potassium 3.5 - 5.1 mmol/L 3.9 3.9 4.6  Chloride 98 - 111 mmol/L 110 110 112(H)  CO2 22 - 32 mmol/L 21(L) 27 29  Calcium 8.9 - 10.3 mg/dL 8.5(L) 8.3(L) 8.5(L)    CBC Latest Ref Rng & Units 05/23/2018 05/21/2018 05/20/2018  WBC 3.6 -  11.0 K/uL 14.8(H) 15.3(H) 14.2(H)  Hemoglobin 12.0 - 16.0 g/dL 9.8(L) 8.8(L) 8.8(L)  Hematocrit 35.0 - 47.0 % 29.8(L) 28.4(L) 27.7(L)  Platelets 150 - 440 K/uL 443(H) 388 492(H)    CXR 08/11: no acute findings    I have personally reviewed all chest radiographs reported above including CXRs and CT chest unless otherwise indicated  IMPRESSION:     ICD-10-CM   1. Severe persistent asthma without complication O17.71 Pulmonary Function Test [facility] Only  2. Vocal cord dysfunction J38.3   3. Moderate obesity E66.8    Her history and her persistent voice problems are worrisome for VCD which complicates evaluation of asthma  PLAN:  Continue Symbicort inhaler, 2 actuations twice a day. Use spacer device (provided). Rinse mouth after use  Continue albuterol as needed  Continue Singulair (Montelukast), Claritin and Flonase as previously prescribed  Continue Protonix as previously prescribed  We discussed the importance of weight loss and strategies to achieve this  PFTs (lung function tests) ordered. We will contact you with the results  Follow up in 8-10 weeks   Merton Border, MD PCCM service Mobile [phone] Pager [phone] 06/17/2018 6:18 PM

## 2018-06-27 ENCOUNTER — Encounter: Payer: Self-pay | Admitting: Nurse Practitioner

## 2018-06-27 DIAGNOSIS — T7840XA Allergy, unspecified, initial encounter: Secondary | ICD-10-CM | POA: Insufficient documentation

## 2018-06-27 DIAGNOSIS — J45909 Unspecified asthma, uncomplicated: Secondary | ICD-10-CM | POA: Insufficient documentation

## 2018-06-27 DIAGNOSIS — T7840XD Allergy, unspecified, subsequent encounter: Secondary | ICD-10-CM

## 2018-07-09 ENCOUNTER — Ambulatory Visit: Payer: Managed Care, Other (non HMO) | Admitting: Allergy and Immunology

## 2018-07-09 ENCOUNTER — Encounter: Payer: Self-pay | Admitting: Allergy and Immunology

## 2018-07-09 VITALS — BP 126/89 | HR 99 | Resp 18 | Ht 65.0 in | Wt 303.0 lb

## 2018-07-09 DIAGNOSIS — J4540 Moderate persistent asthma, uncomplicated: Secondary | ICD-10-CM | POA: Diagnosis not present

## 2018-07-09 DIAGNOSIS — J3089 Other allergic rhinitis: Secondary | ICD-10-CM | POA: Diagnosis not present

## 2018-07-09 DIAGNOSIS — L500 Allergic urticaria: Secondary | ICD-10-CM

## 2018-07-09 DIAGNOSIS — T7840XD Allergy, unspecified, subsequent encounter: Secondary | ICD-10-CM

## 2018-07-09 DIAGNOSIS — Z91018 Allergy to other foods: Secondary | ICD-10-CM | POA: Diagnosis not present

## 2018-07-09 MED ORDER — FLUTICASONE PROPIONATE 93 MCG/ACT NA EXHU: 2.0000 | INHALANT_SUSPENSION | Freq: Two times a day (BID) | NASAL | 5 refills | Status: DC

## 2018-07-09 MED ORDER — LEVOCETIRIZINE DIHYDROCHLORIDE 5 MG PO TABS: 5.0000 mg | ORAL_TABLET | Freq: Every evening | ORAL | 5 refills | Status: DC

## 2018-07-09 MED ORDER — FLUTICASONE PROPIONATE 93 MCG/ACT NA EXHU
2.0000 | INHALANT_SUSPENSION | Freq: Two times a day (BID) | NASAL | 5 refills | Status: DC
Start: 2018-07-09 — End: 2018-07-09

## 2018-07-09 NOTE — Assessment & Plan Note (Addendum)
Improvement after having started Symbicort and montelukast daily.  For now, continue Symbicort 160-4.5 g, 2 inhalations via spacer device twice daily, montelukast 10 mg daily at bedtime, and albuterol HFA, 1 to 2 inhalations every 4-6 hours if needed.  Subjective and objective measures of pulmonary function will be followed and the treatment plan will be adjusted accordingly.

## 2018-07-09 NOTE — Assessment & Plan Note (Signed)
   Aeroallergen avoidance measures have been discussed and provided in written form.  A prescription has been provided for levocetirizine, 5 mg daily as needed.  Continue montelukast.  A prescription has been provided for [facility], 2 actuations per nostril twice a day. Proper technique has been discussed and demonstrated.  Nasal saline spray (i.e., Simply Saline) or nasal saline lavage (i.e., NeilMed) is recommended as needed and prior to medicated nasal sprays.  If allergen avoidance measures and medications fail to adequately relieve symptoms, aeroallergen immunotherapy will be considered.

## 2018-07-09 NOTE — Assessment & Plan Note (Signed)
The patients history suggests allergic reaction with an unclear trigger. Food allergen skin tests were negative today despite a positive histamine control. The negative predictive value for skin tests is excellent (greater than 95%). We will proceed with in vitro lab studies to help establish an etiology.  The following labs have been ordered: FCeRI antibody, anti-thyroglobulin antibody, thyroid peroxidase antibody, baseline serum tryptase, CBC, CMP, ESR, ANA, and serum specific IgE against peanut, peanut components, tree nut panel, tomato, cinnamon, and alpha gal panel.    Should symptoms recur, a journal is to be kept recording any foods eaten, beverages consumed, medications taken within a 6 hour period prior to the onset of symptoms, as well as activities performed, and environmental conditions. For any symptoms concerning for anaphylaxis, epinephrine is to be administered and 911 is to be called immediately. 

## 2018-07-09 NOTE — Patient Instructions (Addendum)
Allergic reaction The patients history suggests allergic reaction with an unclear trigger. Food allergen skin tests were negative today despite a positive histamine control. The negative predictive value for skin tests is excellent (greater than 95%). We will proceed with in vitro lab studies to help establish an etiology.  The following labs have been ordered: FCeRI antibody, anti-thyroglobulin antibody, thyroid peroxidase antibody, baseline serum tryptase, CBC, CMP, ESR, ANA, and serum specific IgE against peanut, peanut components, tree nut panel, tomato, cinnamon, and alpha gal panel.    Should symptoms recur, a journal is to be kept recording any foods eaten, beverages consumed, medications taken within a 6 hour period prior to the onset of symptoms, as well as activities performed, and environmental conditions. For any symptoms concerning for anaphylaxis, epinephrine is to be administered and 911 is to be called immediately.  Reactive airway disease Improvement after having started Symbicort and montelukast daily.  For now, continue Symbicort 160-4.5 g, 2 inhalations via spacer device twice daily, montelukast 10 mg daily at bedtime, and albuterol HFA, 1 to 2 inhalations every 4-6 hours if needed.  Subjective and objective measures of pulmonary function will be followed and the treatment plan will be adjusted accordingly.  Perennial allergic rhinitis  Aeroallergen avoidance measures have been discussed and provided in written form.  A prescription has been provided for levocetirizine, 5 mg daily as needed.  Continue montelukast.  A prescription has been provided for Xhance, 2 actuations per nostril twice a day. Proper technique has been discussed and demonstrated.  Nasal saline spray (i.e., Simply Saline) or nasal saline lavage (i.e., NeilMed) is recommended as needed and prior to medicated nasal sprays.  If allergen avoidance measures and medications fail to adequately relieve  symptoms, aeroallergen immunotherapy will be considered.    When lab results have returned the patient will be called with further recommendations and follow up instructions.  Control of House Dust Mite Allergen  House dust mites play a major role in allergic asthma and rhinitis.  They occur in environments with high humidity wherever human skin, the food for dust mites is found. High levels have been detected in dust obtained from mattresses, pillows, carpets, upholstered furniture, bed covers, clothes and soft toys.  The principal allergen of the house dust mite is found in its feces.  A gram of dust may contain 1,000 mites and 250,000 fecal particles.  Mite antigen is easily measured in the air during house cleaning activities.    1. Encase mattresses, including the box spring, and pillow, in an air tight cover.  Seal the zipper end of the encased mattresses with wide adhesive tape. 2. Wash the bedding in water of 130 degrees Farenheit weekly.  Avoid cotton comforters/quilts and flannel bedding: the most ideal bed covering is the dacron comforter. 3. Remove all upholstered furniture from the bedroom. 4. Remove carpets, carpet padding, rugs, and non-washable window drapes from the bedroom.  Wash drapes weekly or use plastic window coverings. 5. Remove all non-washable stuffed toys from the bedroom.  Wash stuffed toys weekly. 6. Have the room cleaned frequently with a vacuum cleaner and a damp dust-mop.  The patient should not be in a room which is being cleaned and should wait 1 hour after cleaning before going into the room. 7. Close and seal all heating outlets in the bedroom.  Otherwise, the room will become filled with dust-laden air.  An electric heater can be used to heat the room. 8. Reduce indoor humidity to less than 50%.    Do not use a humidifier.  Control of Dog or Cat Allergen  Avoidance is the best way to manage a dog or cat allergy. If you have a dog or cat and are allergic to dog  or cats, consider removing the dog or cat from the home. If you have a dog or cat but don't want to find it a new home, or if your family wants a pet even though someone in the household is allergic, here are some strategies that may help keep symptoms at bay:  1. Keep the pet out of your bedroom and restrict it to only a few rooms. Be advised that keeping the dog or cat in only one room will not limit the allergens to that room. 2. Don't pet, hug or kiss the dog or cat; if you do, wash your hands with soap and water. 3. High-efficiency particulate air (HEPA) cleaners run continuously in a bedroom or living room can reduce allergen levels over time. 4. Place electrostatic material sheet in the air inlet vent in the bedroom. 5. Regular use of a high-efficiency vacuum cleaner or a central vacuum can reduce allergen levels. 6. Giving your dog or cat a bath at least once a week can reduce airborne allergen.   

## 2018-07-09 NOTE — Progress Notes (Signed)
New Patient Note  RE: Jasmine Schultz MRN: [id number] DOB: 22-Jun-1976 Date of Office Visit: 07/09/2018  Referring provider: [name], FNP Primary care provider: [name], FNP  Chief Complaint: Allergic Reaction; Asthma; and Allergic Rhinitis    History of present illness: Jasmine Schultz is a 42 y.o. female seen today in consultation requested by [name], FNP. She reports that she is "allergic to just about everything."  Regarding foods, the consumption of peanut causes the sensation of throat swelling/tightness as well as oral pruritus.  Cinoman causes swelling of the tongue and buccal mucosa.  Eating tomatoes causes her to "breakout and huge welts."  She attempts to avoid these foods and currently has access to epinephrine autoinjectors.  She reports that since 2016 she has experienced recurrent episodes of coughing, dyspnea, chest tightness, and wheezing.  At that time, she was prescribed albuterol HFA for rescue purposes as well as montelukast which she has taken sporadically.  On May 17, 2018 she was admitted into the [facility] intensive care unit and intubated for 4 days.  She reports that earlier that day she had been exposed to cigarette smoke.  She went home took a nap but woke up "struggling to breathe."  She also states that her throat felt tight and she was experiencing ocular pruritus.  She has a history of gastroesophageal reflux, however is uncertain if she was experiencing heartburn at the time.  On discharge, she was prescribed an epinephrine autoinjector 2 pack, Symbicort 160-4.5 g, 2 inhalations via spacer device twice daily, and was instructed to use montelukast 10 mg on a daily scheduled rather than as needed basis.  She reports that having been put on the Symbicort and the montelukast daily that her lower respiratory symptoms have improved significantly. She experiences nasal congestion, rhinorrhea, sneezing, postnasal drainage, nasal  pruritus, ocular pruritus, and occasional sinus pressure.  These symptoms occur year-round but are more frequent and severe during the springtime and in the fall.  She attempts to control the symptoms with loratadine and fluticasone nasal spray without adequate symptom relief. She is in the process of being worked up for autoimmune disease, possibly lupus, for the sensation of burning of her skin as well as a red rash that develops, particularly in sun exposed areas.  Assessment and plan: Allergic reaction The patients history suggests allergic reaction with an unclear trigger. Food allergen skin tests were negative today despite a positive histamine control. The negative predictive value for skin tests is excellent (greater than 95%). We will proceed with in vitro lab studies to help establish an etiology.  The following labs have been ordered: FCeRI antibody, anti-thyroglobulin antibody, thyroid peroxidase antibody, baseline serum tryptase, CBC, CMP, ESR, ANA, and serum specific IgE against peanut, peanut components, tree nut panel, tomato, cinnamon, and alpha gal panel.    Should symptoms recur, a journal is to be kept recording any foods eaten, beverages consumed, medications taken within a 6 hour period prior to the onset of symptoms, as well as activities performed, and environmental conditions. For any symptoms concerning for anaphylaxis, epinephrine is to be administered and 911 is to be called immediately.  Reactive airway disease Improvement after having started Symbicort and montelukast daily.  For now, continue Symbicort 160-4.5 g, 2 inhalations via spacer device twice daily, montelukast 10 mg daily at bedtime, and albuterol HFA, 1 to 2 inhalations every 4-6 hours if needed.  Subjective and objective measures of pulmonary function will be followed and the treatment  plan will be adjusted accordingly.  Perennial allergic rhinitis  Aeroallergen avoidance measures have been discussed  and provided in written form.  A prescription has been provided for levocetirizine, 5 mg daily as needed.  Continue montelukast.  A prescription has been provided for [facility], 2 actuations per nostril twice a day. Proper technique has been discussed and demonstrated.  Nasal saline spray (i.e., Simply Saline) or nasal saline lavage (i.e., NeilMed) is recommended as needed and prior to medicated nasal sprays.  If allergen avoidance measures and medications fail to adequately relieve symptoms, aeroallergen immunotherapy will be considered.    Meds ordered this encounter  Medications  . levocetirizine (XYZAL) 5 MG tablet    Sig: Take 1 tablet (5 mg total) by mouth every evening.    Dispense:  30 tablet    Refill:  5  . DISCONTD: Fluticasone Propionate (XHANCE) 93 MCG/ACT EXHU    Sig: Place 2 sprays into the nose 2 (two) times daily.    Dispense:  16 mL    Refill:  5  . Fluticasone Propionate (XHANCE) 93 MCG/ACT EXHU    Sig: Place 2 sprays into the nose 2 (two) times daily.    Dispense:  16 mL    Refill:  5    [id number]    Diagnostics: Spirometry: FVC was 2.74 L and FEV1 was 2.10 L with an FEV1 ratio of 93%.  There was not significant postbronchodilator improvement.  Please see scanned spirometry results for details. Epicutaneous environmental testing: Negative despite a positive histamine control. Intradermal testing: Positive to cat hair and dust mite antigen. Food allergen skin testing: Negative despite a positive histamine control.    Physical examination: Blood pressure 126/89, pulse 99, resp. rate 18, height _0  (1.651 m), weight (!) 303 lb (137.4 kg), last menstrual period 06/11/2018, SpO2 98 %.  General: Alert, interactive, in no acute distress. HEENT: TMs pearly gray, turbinates edematous without discharge, post-pharynx moderately erythematous. Neck: Supple without lymphadenopathy. Lungs: Clear to auscultation without wheezing, rhonchi or rales. CV: Normal S1, S2  without murmurs. Abdomen: Nondistended, nontender. Skin: Warm and dry, without lesions or rashes. Extremities:  No clubbing, cyanosis or edema. Neuro:   Grossly intact.  Review of systems:  Review of systems negative except as noted in HPI / PMHx or noted below: Review of Systems  Constitutional: Negative.   HENT: Negative.   Eyes: Negative.   Respiratory: Negative.   Cardiovascular: Negative.   Gastrointestinal: Negative.   Genitourinary: Negative.   Musculoskeletal: Negative.   Skin: Negative.   Neurological: Negative.   Endo/Heme/Allergies: Negative.   Psychiatric/Behavioral: Negative.     Past medical history:  Past Medical History:  Diagnosis Date  . Anxiety attack   . Asthma    diagnosed in march 2019  . Bursitis   . Chronically dry eyes   . GERD (gastroesophageal reflux disease)   . IBS (irritable bowel syndrome)   . Migraine   . Multiple allergies   . Sun allergy   . Tendonitis     Past surgical history:  Past Surgical History:  Procedure Laterality Date  . BREAST REDUCTION SURGERY    . REDUCTION MAMMAPLASTY Bilateral    2002  . TUBAL LIGATION      Family history: Family History  Problem Relation Age of Onset  . Asthma Mother   . CAD Unknown   . CVA Unknown   . Aneurysm Unknown        brain aneurysm multiple family members  . Breast cancer Unknown  grandmother  . Sickle cell anemia Unknown        maternal cousin    Social history: Social History   Socioeconomic History  . Marital status: Married    Spouse name: Not on file  . Number of children: Not on file  . Years of education: Not on file  . Highest education level: Not on file  Occupational History  . 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 Financial resource strain: Not on file  . Food insecurity:    Worry: Not on file    Inability: Not on file  . Transportation needs:    Medical: Not on file    Non-medical: Not on file  Tobacco  Use  . Smoking status: Never Smoker  . Smokeless tobacco: Never Used  Substance and Sexual Activity  . Alcohol use: Yes    Alcohol/week: 0.0 standard drinks    Comment: rarley  . Drug use: No  . Sexual activity: Not on file  Lifestyle  . Physical activity:    Days per week: Not on file    Minutes per session: Not on file  . Stress: Not on file  Relationships  . Social connections:    Talks on phone: Not on file    Gets together: Not on file    Attends religious service: Not on file    Active member of club or organization: Not on file    Attends meetings of clubs or organizations: Not on file    Relationship status: Not on file  . Intimate partner violence:    Fear of current or ex partner: Not on file    Emotionally abused: Not on file    Physically abused: Not on file    Forced sexual activity: Not on file  Other Topics Concern  . Not on file  Social History Narrative  . Not on file   Environmental History: The patient lives in house with carpeting throughout, gas heat, and central air.  There is a cat at home which has access to her bedroom.  There is no known mold/water damage in the home.  She is a non-smoker.  Allergies as of 07/09/2018      Reactions   Tomato Anaphylaxis, Rash   Bacitracin-polymyxin B    Band aids cause- dark spots on her skin and itching   Chocolate    Cocoa    Oysters [shellfish Allergy] Hives   Peanut Butter Flavor    Feels congested   Sulfa Antibiotics Hives      Medication List        Accurate as of 07/09/18  8:53 PM. Always use your most recent med list.          albuterol 108 (90 Base) MCG/ACT inhaler Commonly known as:  PROVENTIL HFA;VENTOLIN HFA Inhale 2 puffs into the lungs every 6 (six) hours as needed for wheezing or shortness of breath.   ALPRAZolam 0.25 MG tablet Commonly known as:  XANAX Take 0.25 mg by mouth 3 (three) times daily as needed for anxiety.   B-Complex/B-12 Liqd Place 1 mL under the tongue daily.     benzonatate 100 MG capsule Commonly known as:  TESSALON Take by mouth 3 (three) times daily as needed for cough.   beta carotene 25000 UNIT capsule Take 25,000 Units by mouth daily.   BILBERRY PO Take 250 mg by mouth daily.   Biotin 5 MG Tabs Take 5 mg by  mouth daily.   Borage Oil 500 MG Caps Take 500 mg by mouth daily.   Brewers Yeast 500 MG Tabs Take 2,000 mg by mouth daily.   budesonide-formoterol 160-4.5 MCG/ACT inhaler Commonly known as:  SYMBICORT Inhale 2 puffs into the lungs 2 (two) times daily.   busPIRone 5 MG tablet Commonly known as:  BUSPAR Take 5 mg by mouth 2 (two) times daily.   Cinnamon 500 MG capsule Take 1,000 mg by mouth daily.   clobetasol cream 0.05 % Commonly known as:  TEMOVATE Apply 1 application topically daily as needed.   Co Q 10 100 MG Caps Take 100 mg by mouth daily.   COLON CLEANSER PO Use 1 time weekly as directed as needed   diphenhydrAMINE 25 MG tablet Commonly known as:  BENADRYL Take 25 mg by mouth every 4 (four) hours as needed.   Echinacea 380 MG Caps Take 760 mg by mouth 2 (two) times daily.   EPIPEN 2-PAK 0.3 mg/0.3 mL Soaj injection Generic drug:  EPINEPHrine Inject into the muscle once. Inject 0.61m by intramuscular route once as needed for anaphylaxis   Evening Primrose Oil 1000 MG Caps Take 1,000 mg by mouth daily.   ferrous sulfate 325 (65 FE) MG tablet Take 325 mg by mouth daily.   Fish Oil 1000 MG Caps Take 1,000 mg by mouth daily.   fluticasone 0.05 % cream Commonly known as:  CUTIVATE Apply 1 application topically daily as needed (unknown indication).   Fluticasone Propionate 93 MCG/ACT Exhu Place 2 sprays into the nose 2 (two) times daily.   Ginkgo Biloba 120 MG Tabs Take 120 mg by mouth daily.   HAIR/SKIN/NAILS PO Take 3 tablets by mouth daily.   Korean Panax Ginseng 100 MG Caps Take 100 mg by mouth daily.   Lecithin 1200 MG Caps Take 1,200 mg by mouth daily.   levocetirizine 5 MG  tablet Commonly known as:  XYZAL Take 1 tablet (5 mg total) by mouth every evening.   LIVER & KIDNEY CLEANSER PO Use 1 time weekly as directed as needed   loratadine 10 MG tablet Commonly known as:  CLARITIN Take 10 mg by mouth daily.   Magnesium 250 MG Tabs Take 250 mg by mouth daily.   Melatonin 10 MG Tabs Take 10 mg by mouth at bedtime as needed (sleep).   milk thistle 175 MG tablet Take 175 mg by mouth daily.   montelukast 10 MG tablet Commonly known as:  SINGULAIR Take 1 tablet (10 mg total) by mouth at bedtime.   nystatin powder Generic drug:  nystatin Apply 1 g topically 2 (two) times daily.   ondansetron 4 MG tablet Commonly known as:  ZOFRAN Take 1 tablet (4 mg total) by mouth every 8 (eight) hours as needed for nausea or vomiting.   pantoprazole 40 MG tablet Commonly known as:  PROTONIX Take 40 mg by mouth 2 (two) times daily.   polyvinyl alcohol 1.4 % ophthalmic solution Commonly known as:  LIQUIFILM TEARS Place 1 drop into both eyes 4 (four) times daily.   Potassium 99 MG Tabs Take 99 mg by mouth daily.   prenatal multivitamin Tabs tablet Take 1 tablet by mouth daily.   Red Yeast Rice 600 MG Caps Take 3,000 mg by mouth daily.   selenium 200 MCG Tabs tablet Take 200 mcg by mouth daily.   TRI-LUMA EX Apply 1 application topically at bedtime.   triamcinolone cream 0.1 % Commonly known as:  KENALOG Apply 1 application topically  daily as needed (unknown indication).   Turmeric Curcumin 500 MG Caps Take 500 mg by mouth daily.   urea 20 % cream Commonly known as:  CARMOL Apply topically as needed. Apply to heels BID everyday   Vitamin D-3 1000 units Caps Take 6,000 Units by mouth daily.       Known medication allergies: Allergies  Allergen Reactions  . Tomato Anaphylaxis and Rash  . Bacitracin-Polymyxin B     Band aids cause- dark spots on her skin and itching  . Chocolate   . Cocoa   . Oysters [Shellfish Allergy] Hives  . Peanut  Butter Flavor     Feels congested  . Sulfa Antibiotics Hives    I appreciate the opportunity to take part in Collene's care. Please do not hesitate to contact me with questions.  Sincerely,   R. Edgar Frisk, MD

## 2018-07-11 ENCOUNTER — Ambulatory Visit: Payer: Managed Care, Other (non HMO) | Admitting: Nurse Practitioner

## 2018-07-11 ENCOUNTER — Encounter: Payer: Self-pay | Admitting: Nurse Practitioner

## 2018-07-11 VITALS — BP 110/76 | HR 95 | Temp 99.0°F | Wt 300.0 lb

## 2018-07-11 DIAGNOSIS — J4520 Mild intermittent asthma, uncomplicated: Secondary | ICD-10-CM

## 2018-07-11 DIAGNOSIS — K219 Gastro-esophageal reflux disease without esophagitis: Secondary | ICD-10-CM | POA: Diagnosis not present

## 2018-07-11 DIAGNOSIS — F410 Panic disorder [episodic paroxysmal anxiety] without agoraphobia: Secondary | ICD-10-CM

## 2018-07-11 NOTE — Patient Instructions (Addendum)
-   Continue with medications given to you by Dr. Alva Garnet, - I will contact Dr. Alva Garnet to discuss your work history

## 2018-07-11 NOTE — Progress Notes (Signed)
Subjective:    Patient ID: Jasmine Schultz, female    DOB: 10-Nov-1975, 42 y.o.   MRN: [id number]  She is here today for follow up of her asthma and GERD.  She has seen Dr. Alva Garnet (pulmonology) who feels she may be having vocal cord spasms.  She is on Xhance, Xyzal, montelukast, and nasal rinse.  Notices now any type of exposure to fragrances or fumes she begins to have throat tightening.  Her mother is present with her today  Gastroesophageal Reflux  She complains of coughing and a hoarse voice. She reports no abdominal pain, no chest pain, no heartburn or no sore throat. This is a chronic problem. The current episode started more than 1 month ago. The problem has been unchanged. The symptoms are aggravated by stress, exertion and smoking. Associated symptoms include fatigue. Risk factors include lack of exercise and obesity. She has tried a PPI for the symptoms.  Anxiety  Presents for initial visit. Onset was 6 to 12 months ago. The problem has been rapidly worsening. Symptoms include nervous/anxious behavior, panic and shortness of breath. Patient reports no chest pain, confusion, dizziness or palpitations. Symptoms occur occasionally. The most recent episode lasted 15 minutes. The severity of symptoms is severe and interfering with daily activities. The quality of sleep is fair.   Risk factors include prior traumatic experience. Her past medical history is significant for anxiety/panic attacks. Past treatments include benzodiazephines and herbal remedies. The treatment provided moderate relief. Compliance with prior treatments has been variable.     Past Medical History:  Diagnosis Date  . Anxiety attack   . Asthma    diagnosed in march 2019  . Bursitis   . Chronically dry eyes   . GERD (gastroesophageal reflux disease)   . IBS (irritable bowel syndrome)   . Migraine   . Multiple allergies   . Sun allergy   . Tendonitis    Past Surgical History:  Procedure Laterality Date  .  BREAST REDUCTION SURGERY    . REDUCTION MAMMAPLASTY Bilateral    2002  . TUBAL LIGATION       Current Outpatient Medications:  .  albuterol (PROVENTIL HFA;VENTOLIN HFA) 108 (90 BASE) MCG/ACT inhaler, Inhale 2 puffs into the lungs every 6 (six) hours as needed for wheezing or shortness of breath., Disp: 1 Inhaler, Rfl: 0 .  ALPRAZolam (XANAX) 0.25 MG tablet, Take 0.25 mg by mouth 3 (three) times daily as needed for anxiety., Disp: , Rfl:  .  B Complex Vitamins (B-COMPLEX/B-12) LIQD, Place 1 mL under the tongue daily., Disp: , Rfl:  .  benzonatate (TESSALON) 100 MG capsule, Take by mouth 3 (three) times daily as needed for cough., Disp: , Rfl:  .  budesonide-formoterol (SYMBICORT) 160-4.5 MCG/ACT inhaler, Inhale 2 puffs into the lungs 2 (two) times daily., Disp: 1 Inhaler, Rfl: 12 .  EPINEPHrine (EPIPEN 2-PAK) 0.3 mg/0.3 mL IJ SOAJ injection, Inject into the muscle once. Inject 0.74ml by intramuscular route once as needed for anaphylaxis, Disp: , Rfl:  .  levocetirizine (XYZAL) 5 MG tablet, Take 1 tablet (5 mg total) by mouth every evening., Disp: 30 tablet, Rfl: 5 .  Melatonin 10 MG TABS, Take 10 mg by mouth at bedtime as needed (sleep). , Disp: , Rfl:  .  pantoprazole (PROTONIX) 40 MG tablet, Take 40 mg by mouth 2 (two) times daily., Disp: , Rfl:  .  polyvinyl alcohol (LIQUIFILM TEARS) 1.4 % ophthalmic solution, Place 1 drop into both eyes 4 (four) times  daily., Disp: 15 mL, Rfl: 0 .  triamcinolone cream (KENALOG) 0.1 %, Apply 1 application topically daily as needed (unknown indication). , Disp: , Rfl:  .  beta carotene 25000 UNIT capsule, Take 25,000 Units by mouth daily., Disp: , Rfl:  .  Bilberry, Vaccinium myrtillus, (BILBERRY PO), Take 250 mg by mouth daily. , Disp: , Rfl:  .  Biotin 5 MG TABS, Take 5 mg by mouth daily. , Disp: , Rfl:  .  Borage, Borago officinalis, (BORAGE OIL) 500 MG CAPS, Take 500 mg by mouth daily. , Disp: , Rfl:  .  Brewers Yeast 500 MG TABS, Take 2,000 mg by mouth  daily. , Disp: , Rfl:  .  Cholecalciferol (VITAMIN D-3) 1000 units CAPS, Take 6,000 Units by mouth daily. , Disp: , Rfl:  .  Cinnamon 500 MG capsule, Take 1,000 mg by mouth daily. , Disp: , Rfl:  .  clobetasol cream (TEMOVATE) 9.03 %, Apply 1 application topically daily as needed. , Disp: , Rfl: 3 .  Coenzyme Q10 (CO Q 10) 100 MG CAPS, Take 100 mg by mouth daily. , Disp: , Rfl:  .  Cranberry-Milk Thistle (LIVER & KIDNEY CLEANSER PO), Use 1 time weekly as directed as needed, Disp: , Rfl:  .  Evening Primrose Oil 1000 MG CAPS, Take 1,000 mg by mouth daily., Disp: , Rfl:  .  ferrous sulfate 325 (65 FE) MG tablet, Take 325 mg by mouth daily. , Disp: , Rfl:  .  Fluocin-Hydroquinone-Tretinoin (TRI-LUMA EX), Apply 1 application topically at bedtime. , Disp: , Rfl:  .  fluticasone (CUTIVATE) 0.05 % cream, Apply 1 application topically daily as needed (unknown indication). , Disp: , Rfl:  .  Fluticasone Propionate (XHANCE) 93 MCG/ACT EXHU, Place 2 sprays into the nose 2 (two) times daily., Disp: 16 mL, Rfl: 5 .  Ginkgo Biloba 120 MG TABS, Take 120 mg by mouth daily. , Disp: , Rfl:  .  Korean Panax Ginseng 100 MG CAPS, Take 100 mg by mouth daily. , Disp: , Rfl:  .  Lecithin 1200 MG CAPS, Take 1,200 mg by mouth daily. , Disp: , Rfl:  .  loratadine (CLARITIN) 10 MG tablet, Take 10 mg by mouth daily., Disp: , Rfl:  .  Magnesium 250 MG TABS, Take 250 mg by mouth daily. , Disp: , Rfl:  .  Multiple Vitamins-Minerals (HAIR/SKIN/NAILS PO), Take 3 tablets by mouth daily. , Disp: , Rfl:  .  nystatin (MYCOSTATIN/NYSTOP) 100000 UNIT/GM POWD, Apply 1 g topically 2 (two) times daily. , Disp: , Rfl: 1 .  Omega-3 Fatty Acids (FISH OIL) 1000 MG CAPS, Take 1,000 mg by mouth daily. , Disp: , Rfl:  .  Prenatal Vit-Fe Fumarate-FA (PRENATAL MULTIVITAMIN) TABS tablet, Take 1 tablet by mouth daily. , Disp: , Rfl:  .  Red Yeast Rice 600 MG CAPS, Take 3,000 mg by mouth daily. , Disp: , Rfl:  .  selenium 200 MCG TABS tablet, Take  200 mcg by mouth daily. , Disp: , Rfl:  .  Turmeric Curcumin 500 MG CAPS, Take 500 mg by mouth daily. , Disp: , Rfl:    Review of Systems  Constitutional: Positive for fatigue.  HENT: Positive for hoarse voice. Negative for sore throat.   Respiratory: Positive for cough and shortness of breath.   Cardiovascular: Negative for chest pain and palpitations.  Gastrointestinal: Negative for abdominal pain and heartburn.  Endocrine: Negative for polydipsia and polyphagia.  Allergic/Immunologic: Positive for environmental allergies.  Neurological: Negative for  dizziness and light-headedness.  Psychiatric/Behavioral: Negative for confusion. The patient is nervous/anxious.        Vitals:   07/11/18 0945  BP: 110/76  Pulse: 95  Temp: 99 F (37.2 C)  SpO2: 96%   Vitals:   07/11/18 0945  Weight: 300 lb (136.1 kg)    Objective:   Physical Exam  Constitutional: She appears well-developed and well-nourished.  Neck: Normal range of motion.  Cardiovascular: Regular rhythm and normal pulses. Tachycardia present.  Tachycardia with her anxiety episode with shortness of breath improved after having oxygen at 2 l/m.    Pulmonary/Chest: Tachypnea noted. She is in respiratory distress.  Psychiatric: Her speech is normal and behavior is normal. Judgment and thought content normal. Her mood appears anxious. Cognition and memory are normal.  Tearful during visit when speaking with her about returning to work          Assessment & Plan:  1. Intermittent asthma, unspecified asthma severity, unspecified whether complicated   Fair control however she is better with Symbicort. Exposure to fumes/smoke precipitates and when upset and crying.    2. Anxiety attack   Administered O2 at 2 l/m via nasal cannula during visit, O2 sats decreased to 72%. After calming her down she improved with the oxygen. Once relaxed O2 sats increased to 96%, HR also up to 140 improved to 92.  Occurred during conversation  about her returning to work.  I am concerned she will have exposure to smoke or fragrances and it precipitates breathing issues especially with her recent ICU admission with intubation.  I am referring her to psychiatry for management of anxiety to see if this helps her breathing to focus on getting her back to work. Will also f/u on temporary disability  3. Chronic GERD  Continue Protonix, continue to avoid food triggers.

## 2018-07-16 LAB — ENA+DNA/DS+SJORGEN'S
ENA RNP Ab: >8 AI — ABNORMAL HIGH (ref 0.0–0.9)
ENA SM Ab Ser-aCnc: <0.2 AI (ref 0.0–0.9)
ENA SSA (RO) Ab: <0.2 AI (ref 0.0–0.9)
ENA SSB (LA) Ab: <0.2 AI (ref 0.0–0.9)
dsDNA Ab: <1 IU/mL (ref 0–9)

## 2018-07-16 LAB — CBC WITH DIFFERENTIAL
Basophils Absolute: 0 10*3/uL (ref 0.0–0.2)
Basos: 1 %
EOS (ABSOLUTE): 0.3 10*3/uL (ref 0.0–0.4)
Eos: 5 %
Hematocrit: 30.8 % — ABNORMAL LOW (ref 34.0–46.6)
Hemoglobin: 9.8 g/dL — ABNORMAL LOW (ref 11.1–15.9)
Immature Grans (Abs): 0 10*3/uL (ref 0.0–0.1)
Immature Granulocytes: 0 %
Lymphocytes Absolute: 2 10*3/uL (ref 0.7–3.1)
Lymphs: 32 %
MCH: 24.7 pg — ABNORMAL LOW (ref 26.6–33.0)
MCHC: 31.8 g/dL (ref 31.5–35.7)
MCV: 78 fL — ABNORMAL LOW (ref 79–97)
Monocytes Absolute: 0.5 10*3/uL (ref 0.1–0.9)
Monocytes: 9 %
Neutrophils Absolute: 3.3 10*3/uL (ref 1.4–7.0)
Neutrophils: 53 %
RBC: 3.96 x10E6/uL (ref 3.77–5.28)
RDW: 15.3 % (ref 12.3–15.4)
WBC: 6.1 10*3/uL (ref 3.4–10.8)

## 2018-07-16 LAB — ALPHA-GAL PANEL
Alpha Gal IgE*: <0.1 kU/L (ref <0.10)
Beef (Bos spp) IgE: <0.1 kU/L (ref <0.35)
Class Interpretation: 0
Class Interpretation: 0
Class Interpretation: 0
Lamb/Mutton (Ovis spp) IgE: <0.1 kU/L (ref <0.35)
Pork (Sus spp) IgE: <0.1 kU/L (ref <0.35)

## 2018-07-16 LAB — COMPREHENSIVE METABOLIC PANEL
ALT: 9 IU/L (ref 0–32)
AST: 10 IU/L (ref 0–40)
Albumin/Globulin Ratio: 1.7 (ref 1.2–2.2)
Albumin: 4.3 g/dL (ref 3.5–5.5)
Alkaline Phosphatase: 75 IU/L (ref 39–117)
BUN/Creatinine Ratio: 11 (ref 9–23)
BUN: 8 mg/dL (ref 6–24)
Bilirubin Total: 0.3 mg/dL (ref 0.0–1.2)
CO2: 20 mmol/L (ref 20–29)
Calcium: 9.8 mg/dL (ref 8.7–10.2)
Chloride: 103 mmol/L (ref 96–106)
Creatinine, Ser: 0.76 mg/dL (ref 0.57–1.00)
GFR calc Af Amer: 110 mL/min/{1.73_m2} (ref >59)
GFR calc non Af Amer: 96 mL/min/{1.73_m2} (ref >59)
Globulin, Total: 2.5 g/dL (ref 1.5–4.5)
Glucose: 108 mg/dL — ABNORMAL HIGH (ref 65–99)
Potassium: 4.8 mmol/L (ref 3.5–5.2)
Sodium: 136 mmol/L (ref 134–144)
Total Protein: 6.8 g/dL (ref 6.0–8.5)

## 2018-07-16 LAB — ALLERGENS(7)
Brazil Nut IgE: <0.1 kU/L
F020-IgE Almond: <0.1 kU/L
F202-IgE Cashew Nut: <0.1 kU/L
Hazelnut (Filbert) IgE: <0.1 kU/L
Peanut IgE: <0.1 kU/L
Pecan Nut IgE: <0.1 kU/L
Walnut IgE: <0.1 kU/L

## 2018-07-16 LAB — SEDIMENTATION RATE: Sed Rate: 64 mm/hr — ABNORMAL HIGH (ref 0–32)

## 2018-07-16 LAB — THYROGLOBULIN ANTIBODY: Thyroglobulin Antibody: <1 IU/mL (ref 0.0–0.9)

## 2018-07-16 LAB — ALLERGEN, TOMATO F25: Allergen Tomato, IgE: <0.1 kU/L

## 2018-07-16 LAB — ANA W/REFLEX: Anti Nuclear Antibody(ANA): POSITIVE — AB

## 2018-07-16 LAB — ALLERGEN, CINNAMON, RF220: Allergen Cinnamon IgE: <0.1 kU/L

## 2018-07-16 LAB — THYROID PEROXIDASE ANTIBODY: Thyroperoxidase Ab SerPl-aCnc: 285 IU/mL — ABNORMAL HIGH (ref 0–34)

## 2018-07-16 LAB — TRYPTASE: Tryptase: 2.8 ug/L (ref 2.2–13.2)

## 2018-07-18 ENCOUNTER — Telehealth: Payer: Self-pay | Admitting: Nurse Practitioner

## 2018-07-18 ENCOUNTER — Telehealth: Payer: Self-pay | Admitting: Pulmonary Disease

## 2018-07-18 NOTE — Progress Notes (Signed)
Spoke with patient.  We discussed results in detail and patient voiced understanding.  Patient stated that she does not have any questions at this time.  She was advised to call with questions in the mean time.

## 2018-07-18 NOTE — Telephone Encounter (Signed)
[name], NP from [facility] [employer] [facility] calling Would like to discuss with nurse or Dr. Alva Garnet on when patient should be released back to work Will try and keep patient out for the rest of this week until she hears Dr. Alva Garnet plans Please call to discuss at [phone]

## 2018-07-18 NOTE — Telephone Encounter (Signed)
Patient notified. Work note has been sent to her through the patient portal. Jasmine Schultz

## 2018-07-18 NOTE — Telephone Encounter (Signed)
Called to Dr. Alva Garnet office to inquire on his thoughts on the patient returning to work.  Dr. Alva Garnet is not in the office today and left a message with Caryl Pina.    Dia Sitter, make patient aware a call is out to his office.  Also provide a letter for her to return on Monday 07/23/18.

## 2018-07-18 NOTE — Telephone Encounter (Signed)
Left pt v/m to call office. YRL,RMA 

## 2018-07-18 NOTE — Telephone Encounter (Signed)
Called and spoke with [name] NP at [facility]. Pt was seen today and started on Buspar and Xanax for anxiety. Pt is having trouble breathing and she is not sure if it asthma or anxiety. She is afraid to release patient to go back to work due to breathing concerns. She would like your input as she has written patient out of work until Friday.

## 2018-07-19 NOTE — Progress Notes (Signed)
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  Her autoimmune panels are elevated, I would like to refer her back to Rheumatology.  Is there a provider she would like to see specifically?

## 2018-07-19 NOTE — Telephone Encounter (Signed)
I can't approve more time off without her being seen in office. If her work environment is not tolerable to her and they won't make acceptable changes, she will need to pursue legal representation or she will need to seek a different place of employment  Jasmine Schultz

## 2018-07-19 NOTE — Telephone Encounter (Signed)
Okay thank you, I have mentioned this to her as well.  I will let her know.    Jasmine Schultz

## 2018-07-20 ENCOUNTER — Other Ambulatory Visit: Payer: Self-pay | Admitting: Nurse Practitioner

## 2018-07-20 DIAGNOSIS — R768 Other specified abnormal immunological findings in serum: Secondary | ICD-10-CM

## 2018-08-01 ENCOUNTER — Encounter: Payer: Self-pay | Admitting: Neurology

## 2018-08-01 ENCOUNTER — Other Ambulatory Visit: Payer: Self-pay | Admitting: Nurse Practitioner

## 2018-08-01 ENCOUNTER — Ambulatory Visit (INDEPENDENT_AMBULATORY_CARE_PROVIDER_SITE_OTHER): Payer: Managed Care, Other (non HMO) | Admitting: Neurology

## 2018-08-01 VITALS — BP 124/82 | HR 88 | Ht 65.0 in | Wt 300.4 lb

## 2018-08-01 DIAGNOSIS — J9601 Acute respiratory failure with hypoxia: Secondary | ICD-10-CM

## 2018-08-01 DIAGNOSIS — E662 Morbid (severe) obesity with alveolar hypoventilation: Secondary | ICD-10-CM

## 2018-08-01 DIAGNOSIS — J45909 Unspecified asthma, uncomplicated: Secondary | ICD-10-CM

## 2018-08-01 DIAGNOSIS — J4541 Moderate persistent asthma with (acute) exacerbation: Secondary | ICD-10-CM

## 2018-08-01 DIAGNOSIS — Z6841 Body Mass Index (BMI) 40.0 and over, adult: Secondary | ICD-10-CM

## 2018-08-01 DIAGNOSIS — R0683 Snoring: Secondary | ICD-10-CM | POA: Diagnosis not present

## 2018-08-01 DIAGNOSIS — G4734 Idiopathic sleep related nonobstructive alveolar hypoventilation: Secondary | ICD-10-CM

## 2018-08-01 DIAGNOSIS — D500 Iron deficiency anemia secondary to blood loss (chronic): Secondary | ICD-10-CM

## 2018-08-01 MED ORDER — BUDESONIDE-FORMOTEROL FUMARATE 160-4.5 MCG/ACT IN AERO: 2.0000 | INHALATION_SPRAY | Freq: Two times a day (BID) | RESPIRATORY_TRACT | 1 refills | Status: DC

## 2018-08-01 NOTE — Progress Notes (Addendum)
SLEEP MEDICINE CLINIC   Provider:  Larey Seat, M D  Primary Care Physician:  [name], FNP   Referring Provider: Glendale Chard, MD    Chief Complaint  Patient presents with  . New Patient (Initial Visit)    Referred by [name], NP  . Snoring, no previous sleep study    Rm 10, alone    HPI:  Jasmine Schultz is a 42 y.o. female patient, seen here on 08-01-2018, as in a referral from Dr. Baird Cancer.  Chief complaint according to patient : Jasmine Schultz states that on May 17, 2018 she was admitted to hospital after a life threatening asthma attack, a condition that had not been previously diagnosed.  She has seen a pulmonologist, Dr. Jamal Collin, who is now suggesting that she undergoes a sleep study just as her primary care provider Darleen Crocker has done.  She had acute respiratory failure with documented hypoxemia . She knows that she is snoring but she has not been referred to as having apneas.  She was ventilated for 5 days during her hospital stay, and accordingly sedated. She has never been able to sleep on her back, and as she got heavier she has still not been resuming the sleep position.  Her husband however has noted that she has been wheezing every night, every day.  She was given prednisone and Symbicort after her hospital discharge which has made her feel more jittery, but she does have a pre-existing diagnosis of anxiety.  Sleeping has also been affected by this anxiety. 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    Sleep habits are as follows: she returns from work at 6.30am, eats Leonel Ramsay -does some house work, goes to bed at 7.30- the bedroom is cool, quite and dark with an airfilter, curtains block light, and she uses ear plugs. Sleeps prone or on her sides.  She sleeps on one pillow.  She uses a sleep number bed, she rises between 12 and 2 pm, but sometimes she wakes up  choking, and has attributed this to GERD. She has some weird dreams, but often doesn't remember. Her husband comes back at 2 from his work- and usually that wakes her. Sleeps again from 4-5 Pm and at 8 is back at work. Eats again at work at 10-11 o'clock.   Sleep medical history and family sleep history:  Mother has orthopnea. Sleeps in a recliner, otherwise unable to breath - wakes up choking. Maternal grandmother had breast cancer, paternal and maternal family members have heart disease, aneurysms maternal family, diabetes mellitus on both sides, hypertension on both sides, asthma on the maternal side, strokes on both sides, autoimmune diseases on the maternal side.  She has asthma, connective tissue disease, UV sensitivity, osteoarthritis, anxiety, lumbar disc degeneration, back pain, obesity, GERD.    Social history: married, shift Insurance underwriter. No children. One cat.  Caffeine- none, ETOH- seldomly, non smoker- no passive smoking.    Review of Systems: Out of a complete 14 system review, the patient complains of only the following symptoms, and all other reviewed systems are negative.  Wheezing, GERD, back pain, obesity,  Epworth Sleepiness score: 3/ 24   , Fatigue severity score 26/ 63   , depression score 2/ 15    Social History   Socioeconomic History  . Marital status: Married    Spouse name: Not on file  .  Number of children: Not on file  . Years of education: Not on file  . Highest education level: Not on file  Occupational History  . 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 Financial resource strain: Not on file  . Food insecurity:    Worry: Not on file    Inability: Not on file  . Transportation needs:    Medical: Not on file    Non-medical: Not on file  Tobacco Use  . Smoking status: Never Smoker  . Smokeless tobacco: Never Used  Substance and Sexual Activity  . Alcohol use: Yes    Alcohol/week: 0.0 standard drinks    Comment:  rarley  . Drug use: No  . Sexual activity: Not on file  Lifestyle  . Physical activity:    Days per week: Not on file    Minutes per session: Not on file  . Stress: Not on file  Relationships  . Social connections:    Talks on phone: Not on file    Gets together: Not on file    Attends religious service: Not on file    Active member of club or organization: Not on file    Attends meetings of clubs or organizations: Not on file    Relationship status: Not on file  . Intimate partner violence:    Fear of current or ex partner: Not on file    Emotionally abused: Not on file    Physically abused: Not on file    Forced sexual activity: Not on file  Other Topics Concern  . Not on file  Social History Narrative   Lives home with husband, Elta [place].  Education college.  No children.      Family History  Problem Relation Age of Onset  . Asthma Mother   . Hypertension Mother   . CAD Unknown   . CVA Unknown   . Aneurysm Unknown        brain aneurysm multiple family members  . Breast cancer Unknown        grandmother  . Sickle cell anemia Unknown        maternal cousin  . Aneurysm Maternal Aunt   . Diabetes Maternal Aunt   . Diabetes Maternal Uncle   . Diabetes Paternal Aunt   . Heart disease Paternal Uncle   . Diabetes Paternal Uncle   . Breast cancer Maternal Grandmother   . Diabetes Maternal Grandmother   . Heart disease Maternal Grandfather   . Aneurysm Maternal Grandfather   . Diabetes Maternal Grandfather   . Diabetes Paternal Grandmother   . Heart disease Paternal Grandfather   . Diabetes Paternal Grandfather     Past Medical History:  Diagnosis Date  . Anxiety attack   . Arthritis   . Asthma    diagnosed in march 2019  . Bursitis   . Chronically dry eyes   . GERD (gastroesophageal reflux disease)   . IBS (irritable bowel syndrome)   . Migraine   . Multiple allergies   . Sun allergy   . Tendonitis     Past Surgical History:  Procedure Laterality Date  .  BREAST REDUCTION SURGERY    . REDUCTION MAMMAPLASTY Bilateral    2002  . TUBAL LIGATION      Current Outpatient Medications  Medication Sig Dispense Refill  . albuterol (PROVENTIL HFA;VENTOLIN HFA) 108 (90 BASE) MCG/ACT inhaler Inhale 2 puffs into the lungs every 6 (six) hours as  needed for wheezing or shortness of breath. 1 Inhaler 0  . ALPRAZolam (XANAX) 0.25 MG tablet Take 0.25 mg by mouth 3 (three) times daily as needed for anxiety.    . benzonatate (TESSALON) 100 MG capsule Take by mouth 3 (three) times daily as needed for cough.    . budesonide-formoterol (SYMBICORT) 160-4.5 MCG/ACT inhaler Inhale 2 puffs into the lungs 2 (two) times daily. 1 Inhaler 12  . busPIRone (BUSPAR) 5 MG tablet Take 5 mg by mouth 2 (two) times daily as needed.    . clobetasol cream (TEMOVATE) 2.63 % Apply 1 application topically daily as needed.   3  . EPINEPHrine (EPIPEN 2-PAK) 0.3 mg/0.3 mL IJ SOAJ injection Inject into the muscle once. Inject 0.5ml by intramuscular route once as needed for anaphylaxis    . Fluocin-Hydroquinone-Tretinoin (TRI-LUMA EX) Apply 1 application topically at bedtime.     . fluticasone (CUTIVATE) 0.05 % cream Apply 1 application topically daily as needed (unknown indication).     . Fluticasone Propionate (XHANCE) 93 MCG/ACT EXHU Place 2 sprays into the nose 2 (two) times daily. 16 mL 5  . levocetirizine (XYZAL) 5 MG tablet Take 1 tablet (5 mg total) by mouth every evening. 30 tablet 5  . montelukast (SINGULAIR) 10 MG tablet Take 10 mg by mouth at bedtime.    Marland Kitchen nystatin (MYCOSTATIN/NYSTOP) 100000 UNIT/GM POWD Apply 1 g topically 2 (two) times daily.   1  . ondansetron (ZOFRAN) 4 MG tablet Take 4 mg by mouth every 8 (eight) hours as needed for nausea or vomiting.    . pantoprazole (PROTONIX) 40 MG tablet Take 40 mg by mouth 2 (two) times daily.    Marland Kitchen triamcinolone cream (KENALOG) 0.1 % Apply 1 application topically daily as needed (unknown indication).     . B Complex Vitamins  (B-COMPLEX/B-12) LIQD Place 1 mL under the tongue daily.    . beta carotene 25000 UNIT capsule Take 25,000 Units by mouth daily.    Floria Raveling, Vaccinium myrtillus, (BILBERRY PO) Take 250 mg by mouth daily.     . Biotin 5 MG TABS Take 5 mg by mouth daily.     Chong Sicilian, Borago officinalis, (BORAGE OIL) 500 MG CAPS Take 500 mg by mouth daily.     Marland Kitchen Brewers Yeast 500 MG TABS Take 2,000 mg by mouth daily.     . Cholecalciferol (VITAMIN D-3) 1000 units CAPS Take 6,000 Units by mouth daily.     . Cinnamon 500 MG capsule Take 1,000 mg by mouth daily.     . Coenzyme Q10 (CO Q 10) 100 MG CAPS Take 100 mg by mouth daily.     Lovell Sheehan Thistle (LIVER & KIDNEY CLEANSER PO) Use 1 time weekly as directed as needed    . Evening Primrose Oil 1000 MG CAPS Take 1,000 mg by mouth daily.    . ferrous sulfate 325 (65 FE) MG tablet Take 325 mg by mouth daily.     . Ginkgo Biloba 120 MG TABS Take 120 mg by mouth daily.     . Korean Panax Ginseng 100 MG CAPS Take 100 mg by mouth daily.     . Lecithin 1200 MG CAPS Take 1,200 mg by mouth daily.     Marland Kitchen loratadine (CLARITIN) 10 MG tablet Take 10 mg by mouth daily.    . Magnesium 250 MG TABS Take 250 mg by mouth daily.     . Melatonin 10 MG TABS Take 10 mg by mouth at bedtime as  needed (sleep).     . Multiple Vitamins-Minerals (HAIR/SKIN/NAILS PO) Take 3 tablets by mouth daily.     . Omega-3 Fatty Acids (FISH OIL) 1000 MG CAPS Take 1,000 mg by mouth daily.     . polyvinyl alcohol (LIQUIFILM TEARS) 1.4 % ophthalmic solution Place 1 drop into both eyes 4 (four) times daily. (Patient not taking: Reported on 08/01/2018) 15 mL 0  . Prenatal Vit-Fe Fumarate-FA (PRENATAL MULTIVITAMIN) TABS tablet Take 1 tablet by mouth daily.     . Red Yeast Rice 600 MG CAPS Take 3,000 mg by mouth daily.     Marland Kitchen selenium 200 MCG TABS tablet Take 200 mcg by mouth daily.     . Turmeric Curcumin 500 MG CAPS Take 500 mg by mouth daily.      No current facility-administered medications for  this visit.     Allergies as of 08/01/2018 - Review Complete 08/01/2018  Allergen Reaction Noted  . Tomato Anaphylaxis and Rash 06/05/2015  . Bacitracin-polymyxin b  02/06/2017  . Chocolate    . Cocoa  06/15/2018  . Oysters [shellfish allergy] Hives 06/05/2015  . Peanut butter flavor    . Sulfa antibiotics Hives 06/05/2015    Vitals: BP 124/82   Pulse 88   Ht 5\' 5"  (1.651 m)   Wt (!) 300 lb 6.4 oz (136.3 kg)   BMI 49.99 kg/m  Last Weight:  Wt Readings from Last 1 Encounters:  08/01/18 (!) 300 lb 6.4 oz (136.3 kg)   QVZ:DGLO mass index is 49.99 kg/m.     Last Height:   Ht Readings from Last 1 Encounters:  08/01/18 5\' 5"  (1.651 m)    Physical exam:  General: The patient is awake, alert and appears not in acute distress. The patient is well groomed. Head: Normocephalic, atraumatic. Neck is supple. Mallampati 3,  neck circumference:16.5 ". Nasal airflow patent. Retrognathia is mild .  Cardiovascular:  Regular rate and rhythm , without  murmurs or carotid bruit, and without distended neck veins. Respiratory: Lungs wheezing to auscultation. Skin:  Without evidence of edema, psoriasis of the scalp.  - she has a history of eczema on the neck.  Trunk: BMI is 50. The patient's posture is erect   Neurologic exam : The patient is awake and alert, oriented to place and time.   Attention span & concentration ability appears normal.  Speech is fluent,  without dysarthria, dysphonia or aphasia.  Mood and affect are appropriate.  Cranial nerves: Pupils are equal and briskly reactive to light. Funduscopic exam without evidence of pallor or edema. Extraocular movements  in vertical and horizontal planes intact and without nystagmus. Visual fields by finger perimetry are intact. Hearing to finger rub intact.  Facial sensation intact to fine touch. Facial motor strength is symmetric and tongue and uvula move midline. Shoulder shrug was symmetrical.   Motor exam:   Normal tone, muscle  bulk and symmetric strength in all extremities. Sensory:  Fine touch, pinprick and vibration were tested in all extremities. Proprioception tested in the upper extremities was normal. Coordination: Rapid alternating movements in the fingers/hands was normal. Finger-to-nose maneuver  normal without evidence of ataxia, dysmetria or tremor. Gait and station: Patient walks without assistive device and is able unassisted to climb up to the exam table. Strength within normal limits.Stance is stable and normal. Tandem gait is unfragmented. Turns with 3 Steps.  Deep tendon reflexes: in the upper and lower extremities are symmetric and intact.   Assessment:  After physical and neurologic  examination, review of laboratory studies,  Personal review of imaging studies, reports of other /same  Imaging studies, results of polysomnography and / or neurophysiology testing and pre-existing records as far as provided in visit., my assessment is   1) Newly diagnosed ASTHMA, and GERD related arousals from sleep- in association with morbid obesity, large neck size - all are giving her high risk status for OSA.   2) Vocal cord spasms- she is awaiting speech therapy, part of asthma and allergies?    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 Attended sleep study scheduled for daytime.    4) needs medical weight management , probably most beneficial once asthma is controlled and not longer in need of steroids.    5) connective tissue disorder- Follows rheumatology.   The patient was advised of the nature of the diagnosed disorder , the treatment options and the  risks for general health and wellness arising from not treating the condition.   I spent more than 50 minutes of face to face time with the patient.  Greater than 50% of time was spent in counseling and coordination of care. We have discussed the diagnosis and differential and I answered the patient's questions.    Plan:  Treatment plan and  additional workup :  SPLIT night PSG , needs evaluation for apnea of any kind, hypoxemia, hypercapnia and needs to bring her inhaler.    Larey Seat, MD   94/[id number]/[id number], 0:35 AM  Certified in Neurology by ABPN Certified in [place] by [facility] [address], [place], [place] [address]

## 2018-08-06 NOTE — Addendum Note (Signed)
Addended by: [name] F on: 08/06/2018 05:31 PM   Modules accepted: Level of Service

## 2018-08-07 ENCOUNTER — Ambulatory Visit: Payer: Managed Care, Other (non HMO) | Attending: Pulmonary Disease

## 2018-08-07 DIAGNOSIS — J4550 Severe persistent asthma, uncomplicated: Secondary | ICD-10-CM | POA: Insufficient documentation

## 2018-08-07 MED ORDER — ALBUTEROL SULFATE (2.5 MG/3ML) 0.083% IN NEBU
2.5000 mg | INHALATION_SOLUTION | Freq: Once | RESPIRATORY_TRACT | Status: AC
  Administered 2018-08-07: 2.5 mg via RESPIRATORY_TRACT
  Filled 2018-08-07: qty 3

## 2018-08-16 ENCOUNTER — Encounter: Payer: Self-pay | Admitting: Pulmonary Disease

## 2018-08-16 ENCOUNTER — Other Ambulatory Visit
Admission: RE | Admit: 2018-08-16 | Discharge: 2018-08-16 | Disposition: A | Discharge status: Home / Self Care | Payer: Managed Care, Other (non HMO) | Source: Ambulatory Visit | Attending: Pulmonary Disease | Admitting: Pulmonary Disease

## 2018-08-16 ENCOUNTER — Ambulatory Visit: Payer: Managed Care, Other (non HMO) | Admitting: Pulmonary Disease

## 2018-08-16 VITALS — BP 130/90 | HR 84 | Resp 16 | Ht 65.0 in | Wt 303.0 lb

## 2018-08-16 DIAGNOSIS — J4550 Severe persistent asthma, uncomplicated: Secondary | ICD-10-CM | POA: Insufficient documentation

## 2018-08-16 LAB — CBC WITH DIFFERENTIAL/PLATELET
Abs Immature Granulocytes: 0.02 10*3/uL (ref 0.00–0.07)
Basophils Absolute: 0 10*3/uL (ref 0.0–0.1)
Basophils Relative: 1 %
EOS ABS: 0.4 10*3/uL (ref 0.0–0.5)
EOS PCT: 6 %
HCT: 28.5 % — ABNORMAL LOW (ref 36.0–46.0)
Hemoglobin: 8.6 g/dL — ABNORMAL LOW (ref 12.0–15.0)
Immature Granulocytes: 0 %
Lymphocytes Relative: 37 %
Lymphs Abs: 2.2 10*3/uL (ref 0.7–4.0)
MCH: 24 pg — ABNORMAL LOW (ref 26.0–34.0)
MCHC: 30.2 g/dL (ref 30.0–36.0)
MCV: 79.4 fL — AB (ref 80.0–100.0)
MONO ABS: 0.6 10*3/uL (ref 0.1–1.0)
MONOS PCT: 9 %
Neutro Abs: 2.9 10*3/uL (ref 1.7–7.7)
Neutrophils Relative %: 47 %
Platelets: 559 10*3/uL — ABNORMAL HIGH (ref 150–400)
RBC: 3.59 MIL/uL — ABNORMAL LOW (ref 3.87–5.11)
RDW: 16.1 % — AB (ref 11.5–15.5)
WBC: 6.1 10*3/uL (ref 4.0–10.5)
nRBC: 0 % (ref 0.0–0.2)

## 2018-08-16 NOTE — Addendum Note (Signed)
Addended by: Alva Garnet, DAVID B on: 08/16/2018 11:05 AM   Modules accepted: Orders

## 2018-08-16 NOTE — Patient Instructions (Signed)
Continue Symbicort inhaler, 2 actuations twice a day.  Rinse mouth after use Continue montelukast (Singulair) 10 mg daily at bedtime Continue Xyzal 5 mg daily Continue albuterol inhaler as needed for increased cough, wheezing, shortness of breath, chest tightness  Blood test today: CBC with WBC differential, IgE  Follow-up in 3 months.  Call sooner if needed

## 2018-08-16 NOTE — Progress Notes (Signed)
PULMONARY OFFICE FOLLOW UP NOTE  Primary MD: Glendale Chard  Date of initial office visit: 06/15/18  Reason for consultation: Post hospitalization after intubation for status asthmaticus  PT PROFILE: 42 y.o. female never smoker admitted to [facility] 8/8-8/14/19 with status asthmaticus. She was intubated in ED and briefly required epinephrine an ketamine infusions. She self extubated on 8/11.   DATA: 05/19/18 CTA chest: no acute findings except mild dependent atelectasis 08/07/2018 PFTs: FVC: 2.92 > 2.98 L (91 > 93 %pred), FEV1: 2.30 > 2.47 L (88 > 95% pred), FEV1/FVC: 79%, TLC: 4.5 to L (86 %pred), DLCO 78 %pred  INTERVAL: No major pulmonary events  SUBJ:  This is a scheduled follow-up.  She is now back to work.  She is tolerating it reasonably well.  She is compliant with her asthma regimen of Symbicort, montelukast and levcetirizine.  She is rarely requiring albuterol inhaler.  She continues to have voice hoarseness but this appears to be improving.  There is a variable component to this.  She denies significant cough, sputum production, chest pain or tightness, shortness of breath.  She is presently seeing an allergist for skin problems.  She has also been referred to a rheumatologist.    Vitals:   08/16/18 0851 08/16/18 0855  BP:  130/90  Pulse:  84  Resp: 16   SpO2:  100%  Weight: (!) 303 lb (137.4 kg)   Height: 5\' 5"  (1.651 m)   RA   EXAM:  Gen: Obese, hoarse voice, no overt distress HEENT: [place], sclerae white Neck: Supple, JVP cannot be visualized Lungs: Full breath sounds without wheezes or other adventitious sounds Cardiovascular: Regular, no M Abdomen: Obese, soft, NT, NABS Ext: No C/C/E Neuro: grossly intact Skin: Limited exam, no lesions noted   DATA:   BMP Latest Ref Rng & Units 07/11/2018 05/23/2018 05/21/2018  Glucose 65 - 99 mg/dL 108(H) 83 112(H)  BUN 6 - 24 mg/dL 8 21(H) 27(H)  Creatinine 0.57 - 1.00 mg/dL 0.76 0.53 0.70  BUN/Creat Ratio 9 - 23 11 - -   Sodium 134 - 144 mmol/L 136 138 143  Potassium 3.5 - 5.2 mmol/L 4.8 3.9 3.9  Chloride 96 - 106 mmol/L 103 110 110  CO2 20 - 29 mmol/L 20 21(L) 27  Calcium 8.7 - 10.2 mg/dL 9.8 8.5(L) 8.3(L)    CBC Latest Ref Rng & Units 07/11/2018 05/23/2018 05/21/2018  WBC 3.4 - 10.8 x10E3/uL 6.1 14.8(H) 15.3(H)  Hemoglobin 11.1 - 15.9 g/dL 9.8(L) 9.8(L) 8.8(L)  Hematocrit 34.0 - 46.6 % 30.8(L) 29.8(L) 28.4(L)  Platelets 150 - 440 K/uL - 443(H) 388    CXR: no new film  PFTs are reviewed above  I have personally reviewed all chest radiographs reported above including CXRs and CT chest unless otherwise indicated  IMPRESSION:     ICD-10-CM   1. Severe persistent asthma without complication X73.53 IgE    CBC with Differential/Platelet    CANCELED: CBC with Differential/Platelet    CANCELED: IgE   Her asthma appears to be well controlled.  I continue to believe that there may be a component of vocal cord dysfunction syndrome.  PLAN:  Continue Symbicort inhaler, 2 actuations twice a day. Use spacer device (provided). Rinse mouth after use Continue Singulair (Montelukast), Xyzal and Flonase as previously prescribed Continue albuterol as needed We again discussed the importance of weight loss and strategies to achieve this Blood tests today: CBC with WBC differential, total IgE Follow-up in 3 months.  Call sooner if needed  Merton Border, MD PCCM service Mobile [phone] Pager [phone] 08/16/2018 10:08 AM

## 2018-08-18 LAB — IGE: IgE (Immunoglobulin E), Serum: 135 IU/mL (ref 6–495)

## 2018-08-20 ENCOUNTER — Other Ambulatory Visit: Payer: Self-pay | Admitting: Nurse Practitioner

## 2018-08-20 DIAGNOSIS — F410 Panic disorder [episodic paroxysmal anxiety] without agoraphobia: Secondary | ICD-10-CM

## 2018-09-03 ENCOUNTER — Telehealth: Payer: Self-pay

## 2018-09-03 ENCOUNTER — Other Ambulatory Visit: Payer: Self-pay | Admitting: Neurology

## 2018-09-03 DIAGNOSIS — G4734 Idiopathic sleep related nonobstructive alveolar hypoventilation: Secondary | ICD-10-CM

## 2018-09-03 DIAGNOSIS — R0683 Snoring: Secondary | ICD-10-CM

## 2018-09-03 DIAGNOSIS — J9601 Acute respiratory failure with hypoxia: Secondary | ICD-10-CM

## 2018-09-03 DIAGNOSIS — D500 Iron deficiency anemia secondary to blood loss (chronic): Secondary | ICD-10-CM

## 2018-09-03 NOTE — Telephone Encounter (Signed)
Insurance has denied in lab sleep study request. Do you want to order a HST?

## 2018-09-03 NOTE — Telephone Encounter (Signed)
ordered

## 2018-09-05 ENCOUNTER — Other Ambulatory Visit: Payer: Self-pay | Admitting: Nurse Practitioner

## 2018-09-12 ENCOUNTER — Other Ambulatory Visit: Payer: Self-pay | Admitting: Nurse Practitioner

## 2018-09-12 DIAGNOSIS — K219 Gastro-esophageal reflux disease without esophagitis: Secondary | ICD-10-CM

## 2018-09-12 MED ORDER — PANTOPRAZOLE SODIUM 40 MG PO TBEC: 40.0000 mg | DELAYED_RELEASE_TABLET | Freq: Two times a day (BID) | ORAL | 1 refills | Status: DC

## 2018-09-26 ENCOUNTER — Other Ambulatory Visit: Payer: Self-pay | Admitting: Nurse Practitioner

## 2018-10-15 ENCOUNTER — Ambulatory Visit: Payer: Managed Care, Other (non HMO) | Admitting: Nurse Practitioner

## 2018-10-15 ENCOUNTER — Telehealth: Payer: Self-pay

## 2018-10-15 NOTE — Telephone Encounter (Signed)
Patient's husband clled atating pt took her medicine and it makes her go to sleep so she would not be able to make her appointment so he rescheduled it. YRL,RMA

## 2018-10-18 ENCOUNTER — Encounter: Payer: Self-pay | Admitting: Nurse Practitioner

## 2018-10-18 ENCOUNTER — Telehealth: Payer: Self-pay | Admitting: Nurse Practitioner

## 2018-10-18 ENCOUNTER — Ambulatory Visit: Payer: Managed Care, Other (non HMO) | Admitting: Nurse Practitioner

## 2018-10-18 VITALS — BP 128/82 | HR 74 | Temp 98.1°F | Ht 65.0 in | Wt 298.8 lb

## 2018-10-18 DIAGNOSIS — R7309 Other abnormal glucose: Secondary | ICD-10-CM

## 2018-10-18 DIAGNOSIS — J45909 Unspecified asthma, uncomplicated: Secondary | ICD-10-CM | POA: Diagnosis not present

## 2018-10-18 DIAGNOSIS — F419 Anxiety disorder, unspecified: Secondary | ICD-10-CM | POA: Diagnosis not present

## 2018-10-18 DIAGNOSIS — R0683 Snoring: Secondary | ICD-10-CM

## 2018-10-18 MED ORDER — HYDROXYCHLOROQUINE SULFATE 200 MG PO TABS: 200.0000 mg | ORAL_TABLET | Freq: Two times a day (BID) | ORAL | 2 refills | Status: AC

## 2018-10-18 MED ORDER — ALPRAZOLAM 0.25 MG PO TABS: 0.2500 mg | ORAL_TABLET | Freq: Three times a day (TID) | ORAL | 2 refills | Status: DC | PRN

## 2018-10-18 MED ORDER — BUSPIRONE HCL 5 MG PO TABS: 5.0000 mg | ORAL_TABLET | Freq: Every day | ORAL | 1 refills | Status: DC

## 2018-10-18 NOTE — Progress Notes (Signed)
Subjective:     Patient ID: Jasmine Schultz , female    DOB: [birth year] , 43 y.o.   MRN: [id number]   Chief Complaint  Patient presents with  . Anxiety    patient states she has been feeling better since she was lost seen.    HPI  She has been in touch with [facility].  She is back at work since October 2019, wears a mask all night long.  Works at a [employer].  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    Her insurance denied a sleep study has to do an HST.  She is awaiting a referral from Dr. Alva Garnet for a voice coach. She has not been to Dr. Darleene Cleaver at this time, has not received a call.    Anxiety  Presents for follow-up visit. Patient reports no chest pain or palpitations.       Past Medical History:  Diagnosis Date  . Anxiety attack   . Arthritis   . Asthma    diagnosed in march 2019  . Bursitis   . Chronically dry eyes   . GERD (gastroesophageal reflux disease)   . IBS (irritable bowel syndrome)   . Migraine   . Multiple allergies   . Sun allergy   . Tendonitis      Family History  Problem Relation Age of Onset  . Asthma Mother   . Hypertension Mother   . CAD Unknown   . CVA Unknown   . Aneurysm Unknown        brain aneurysm multiple family members  . Breast cancer Unknown        grandmother  . Sickle cell anemia Unknown        maternal cousin  . Aneurysm Maternal Aunt   . Diabetes Maternal Aunt   . Diabetes Maternal Uncle   . Diabetes Paternal Aunt   . Heart disease Paternal Uncle   . Diabetes Paternal Uncle   . Breast cancer Maternal Grandmother   . Diabetes Maternal Grandmother   . Heart disease Maternal Grandfather   . Aneurysm Maternal Grandfather   . Diabetes Maternal Grandfather   . Diabetes Paternal Grandmother   . Heart disease Paternal Grandfather   . Diabetes Paternal Grandfather      Current Outpatient Medications:  .  albuterol (PROVENTIL HFA;VENTOLIN  HFA) 108 (90 BASE) MCG/ACT inhaler, Inhale 2 puffs into the lungs every 6 (six) hours as needed for wheezing or shortness of breath., Disp: 1 Inhaler, Rfl: 0 .  ALPRAZolam (XANAX) 0.25 MG tablet, TAKE 1 TABLET BY MOUTH THREE TIMES DAILY AS NEEDED, Disp: 30 tablet, Rfl: 0 .  benzonatate (TESSALON) 100 MG capsule, Take by mouth 3 (three) times daily as needed for cough., Disp: , Rfl:  .  budesonide-formoterol (SYMBICORT) 160-4.5 MCG/ACT inhaler, Inhale 2 puffs into the lungs 2 (two) times daily., Disp: 3 Inhaler, Rfl: 1 .  busPIRone (BUSPAR) 5 MG tablet, Take 5 mg by mouth 2 (two) times daily as needed., Disp: , Rfl:  .  Cholecalciferol (VITAMIN D-3) 1000 units CAPS, Take 6,000 Units by mouth daily. , Disp: , Rfl:  .  clobetasol cream (TEMOVATE) 6.71 %, Apply 1 application topically daily as needed. , Disp: , Rfl: 3 .  Coenzyme Q10 (CO Q 10) 100 MG CAPS, Take 100 mg by mouth daily. , Disp: , Rfl:  .  EPINEPHrine (EPIPEN 2-PAK) 0.3 mg/0.3 mL IJ SOAJ  injection, Inject into the muscle once. Inject 0.33ml by intramuscular route once as needed for anaphylaxis, Disp: , Rfl:  .  ferrous sulfate 325 (65 FE) MG tablet, Take 325 mg by mouth daily. , Disp: , Rfl:  .  Fluocin-Hydroquinone-Tretinoin (TRI-LUMA EX), Apply 1 application topically at bedtime. , Disp: , Rfl:  .  fluticasone (CUTIVATE) 0.05 % cream, Apply 1 application topically daily as needed (unknown indication). , Disp: , Rfl:  .  Fluticasone Propionate (XHANCE) 93 MCG/ACT EXHU, Place 2 sprays into the nose 2 (two) times daily., Disp: 16 mL, Rfl: 5 .  Lecithin 1200 MG CAPS, Take 1,200 mg by mouth daily. , Disp: , Rfl:  .  levocetirizine (XYZAL) 5 MG tablet, Take 1 tablet (5 mg total) by mouth every evening., Disp: 30 tablet, Rfl: 5 .  montelukast (SINGULAIR) 10 MG tablet, Take 10 mg by mouth at bedtime., Disp: , Rfl:  .  nystatin (MYCOSTATIN/NYSTOP) 100000 UNIT/GM POWD, Apply 1 g topically 2 (two) times daily. , Disp: , Rfl: 1 .  pantoprazole  (PROTONIX) 40 MG tablet, Take 1 tablet (40 mg total) by mouth 2 (two) times daily., Disp: 180 tablet, Rfl: 1 .  polyvinyl alcohol (LIQUIFILM TEARS) 1.4 % ophthalmic solution, Place 1 drop into both eyes 4 (four) times daily., Disp: 15 mL, Rfl: 0 .  triamcinolone cream (KENALOG) 0.1 %, Apply 1 application topically daily as needed (unknown indication). , Disp: , Rfl:    Allergies  Allergen Reactions  . Tomato Anaphylaxis and Rash  . Bacitracin-Polymyxin B     Band aids cause- dark spots on her skin and itching  . Chocolate   . Cocoa   . Oysters [Shellfish Allergy] Hives  . Peanut Butter Flavor     Feels congested  . Sulfa Antibiotics Hives     Review of Systems  Constitutional: Negative for fatigue.  Respiratory: Negative.   Cardiovascular: Negative.  Negative for chest pain, palpitations and leg swelling.  Endocrine: Negative for polydipsia, polyphagia and polyuria.  Musculoskeletal: Negative.   Skin: Negative.      Today's Vitals   10/18/18 1532  BP: 128/82  Pulse: 74  Temp: 98.1 F (36.7 C)  TempSrc: Oral  SpO2: 97%  Weight: 298 lb 12.8 oz (135.5 kg)  Height: 5\' 5"  (1.651 m)  PainSc: 6   PainLoc: Foot   Body mass index is 49.72 kg/m.   Objective:  Physical Exam Vitals signs reviewed.  Constitutional:      Appearance: Normal appearance.  HENT:     Mouth/Throat:     Comments: Hoarse Cardiovascular:     Rate and Rhythm: Normal rate and regular rhythm.     Pulses: Normal pulses.     Heart sounds: Normal heart sounds. No murmur.  Pulmonary:     Effort: Pulmonary effort is normal.     Breath sounds: Normal breath sounds.  Skin:    General: Skin is warm and dry.  Neurological:     Mental Status: She is alert.         Assessment And Plan:     1. Severe asthma, unspecified whether complicated, unspecified whether persistent  Chronic  She is doing much better  Continue follow up with Dr. Alva Garnet, I will call his office to inquire about a voice  coach.  2. Abnormal glucose  Chronic, good control.   Continue with current medications  Encouraged to limit intake of sugary foods and drinks  Encouraged to increase physical activity to 150 minutes per week  3. Anxiety  Chronic, stable  I have given her the number or [facility] (psychiatry) - busPIRone (BUSPAR) 5 MG tablet; Take 1 tablet (5 mg total) by mouth daily.  Dispense: 90 tablet; Refill: 1  4. Snoring  She is awaiting a HST device because her insurance doesn't cover the sleep study until this is done.    Will contact her [facility] to discuss her plan of care.       [name], FNP

## 2018-10-18 NOTE — Telephone Encounter (Signed)
Called Beth the liason with [facility] to return my call at her earliest convenience to discuss the patients plan of care.

## 2018-10-19 LAB — CBC WITH DIFFERENTIAL/PLATELET
Basophils Absolute: 0 10*3/uL (ref 0.0–0.2)
Basos: 1 %
EOS (ABSOLUTE): 0.2 10*3/uL (ref 0.0–0.4)
Eos: 3 %
Hematocrit: 28 % — ABNORMAL LOW (ref 34.0–46.6)
Hemoglobin: 8.2 g/dL — ABNORMAL LOW (ref 11.1–15.9)
IMMATURE GRANULOCYTES: 0 %
Immature Grans (Abs): 0 10*3/uL (ref 0.0–0.1)
Lymphocytes Absolute: 2.4 10*3/uL (ref 0.7–3.1)
Lymphs: 39 %
MCH: 21.9 pg — ABNORMAL LOW (ref 26.6–33.0)
MCHC: 29.3 g/dL — ABNORMAL LOW (ref 31.5–35.7)
MCV: 75 fL — ABNORMAL LOW (ref 79–97)
MONOCYTES: 12 %
Monocytes Absolute: 0.7 10*3/uL (ref 0.1–0.9)
Neutrophils Absolute: 2.8 10*3/uL (ref 1.4–7.0)
Neutrophils: 45 %
Platelets: 615 10*3/uL — ABNORMAL HIGH (ref 150–450)
RBC: 3.75 x10E6/uL — ABNORMAL LOW (ref 3.77–5.28)
RDW: 15.6 % — ABNORMAL HIGH (ref 11.7–15.4)
WBC: 6.2 10*3/uL (ref 3.4–10.8)

## 2018-10-19 LAB — CMP14 + ANION GAP
ALT: 17 IU/L (ref 0–32)
ANION GAP: 13 mmol/L (ref 10.0–18.0)
AST: 20 IU/L (ref 0–40)
Albumin/Globulin Ratio: 1.5 (ref 1.2–2.2)
Albumin: 4.2 g/dL (ref 3.5–5.5)
Alkaline Phosphatase: 73 IU/L (ref 39–117)
BUN/Creatinine Ratio: 11 (ref 9–23)
BUN: 8 mg/dL (ref 6–24)
Bilirubin Total: 0.3 mg/dL (ref 0.0–1.2)
CO2: 21 mmol/L (ref 20–29)
Calcium: 9.9 mg/dL (ref 8.7–10.2)
Chloride: 106 mmol/L (ref 96–106)
Creatinine, Ser: 0.73 mg/dL (ref 0.57–1.00)
GFR calc Af Amer: 116 mL/min/{1.73_m2} (ref >59)
GFR calc non Af Amer: 100 mL/min/{1.73_m2} (ref >59)
Globulin, Total: 2.8 g/dL (ref 1.5–4.5)
Glucose: 83 mg/dL (ref 65–99)
Potassium: 4.9 mmol/L (ref 3.5–5.2)
Sodium: 140 mmol/L (ref 134–144)
Total Protein: 7 g/dL (ref 6.0–8.5)

## 2018-10-19 LAB — HEMOGLOBIN A1C
Est. average glucose Bld gHb Est-mCnc: 114 mg/dL
Hgb A1c MFr Bld: 5.6 % (ref 4.8–5.6)

## 2018-10-29 LAB — FERRITIN: Ferritin: 4 ng/mL — ABNORMAL LOW (ref 15–150)

## 2018-10-29 LAB — IRON AND TIBC
Iron Saturation: 6 % — CL (ref 15–55)
Iron: 22 ug/dL — ABNORMAL LOW (ref 27–159)
Total Iron Binding Capacity: 399 ug/dL (ref 250–450)
UIBC: 377 ug/dL (ref 131–425)

## 2018-11-05 ENCOUNTER — Ambulatory Visit (INDEPENDENT_AMBULATORY_CARE_PROVIDER_SITE_OTHER): Payer: Managed Care, Other (non HMO) | Admitting: Neurology

## 2018-11-05 ENCOUNTER — Telehealth: Payer: Self-pay | Admitting: Neurology

## 2018-11-05 DIAGNOSIS — G4733 Obstructive sleep apnea (adult) (pediatric): Secondary | ICD-10-CM

## 2018-11-05 DIAGNOSIS — D500 Iron deficiency anemia secondary to blood loss (chronic): Secondary | ICD-10-CM

## 2018-11-05 DIAGNOSIS — G4734 Idiopathic sleep related nonobstructive alveolar hypoventilation: Secondary | ICD-10-CM

## 2018-11-05 DIAGNOSIS — R0683 Snoring: Secondary | ICD-10-CM

## 2018-11-05 DIAGNOSIS — J9601 Acute respiratory failure with hypoxia: Secondary | ICD-10-CM

## 2018-11-05 NOTE — Telephone Encounter (Signed)
I returned this patient's call to the answering service and spoke to her.  She was worried that her home sleep study had not recorded her sleep adequately.  When she woke up the machine started recalibrating.  She is scheduled to come tomorrow morning to return the home sleep monitor.  I advised her to mention this to the sleep lab and someone from sleep lab will get back to her with instructions whether she needs to repeat the study or not.

## 2018-11-12 DIAGNOSIS — G4734 Idiopathic sleep related nonobstructive alveolar hypoventilation: Secondary | ICD-10-CM | POA: Insufficient documentation

## 2018-11-12 DIAGNOSIS — G4733 Obstructive sleep apnea (adult) (pediatric): Secondary | ICD-10-CM | POA: Insufficient documentation

## 2018-11-12 NOTE — Addendum Note (Signed)
Addended by: Larey Seat on: 11/12/2018 12:36 PM   Modules accepted: Orders

## 2018-11-12 NOTE — Procedures (Signed)
NAME:  Arsenio Loader                                                                                   DOB: [birth year] MEDICAL RECORD No: [id number]                                                                      DOS:  11/05/2018 REFERRING PHYSICIAN: Glendale Chard, MD STUDY PERFORMED: Home Sleep Test on [facility] HISTORY: CHRISLYNN MOSELY is a 43 y.o. female patient, seen here on 08-01-2018, upon a referral from Dr. Baird Cancer. Chief complaint according to patient : Mrs. Mccardle states that on May 17, 2018 she was admitted to hospital after a life threatening asthma attack, a condition that had not been previously diagnosed.  She has seen a pulmonologist, Dr. Jamal Collin, who is now suggesting that she undergoes a sleep study just as her primary care provider, NP Darleen Crocker has done.  She had acute respiratory failure with documented hypoxemia. She knows that she is snoring but she has not been referred to as having apneas.  She was ventilated for 5 days during her hospital stay, and accordingly sedated. She has never been able to sleep on her back. As she got heavier she has still not been resuming the supine sleep position. Her husband however has noted that she has been wheezing every night, every day.  She was given prednisone and Symbicort after her hospital discharge which has made her feel jittery, does have a pre-existing diagnosis of anxiety.  Sleeping has also been affected by this anxiety. 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 Epworth Sleepiness score: 3/ 24, Fatigue severity score 26/ 63 points, BMI: 50 kg/m2. STUDY RESULTS:  Total Recording Time: 10 h 4 mins; Valid Sleep Time:  5 h 47 mins,  Total Apnea/Hypopnea Index (AHI):  57.8/ h; RDI: 58.2 / h; REM AHI: 78.5 / h Average Oxygen Saturation: 97 %; Lowest Oxygen Saturation:  90 %  Total Time in Oxygen Saturation below 89 %:  0.0 minutes   Average Heart Rate:  68 bpm (between 49 and 100 bpm). IMPRESSION: Severe OSA by general AHI and strongly accentuated during REM sleep.  RECOMMENDATION: CPAP autotitration is recommended. 5-18 cm water with 3 cm EPR and patient's choice of interface, heated humidification.  I certify that I have reviewed the raw data recording prior to the issuance of this report in accordance with the standards of the American Academy of Sleep Medicine (AASM). Larey Seat, M.D.     11-12-2018   Medical Director of [facility] at [facility], accredited by the AASM. Diplomat of the ABPN and ABSM.

## 2018-11-13 ENCOUNTER — Encounter: Payer: Self-pay | Admitting: Neurology

## 2018-11-13 ENCOUNTER — Telehealth: Payer: Self-pay | Admitting: Neurology

## 2018-11-13 NOTE — Telephone Encounter (Signed)
-----   Message from Larey Seat, MD sent at 11/12/2018 12:36 PM EST ----- IMPRESSION: Severe OSA by general AHI and strongly accentuated  during REM sleep.  RECOMMENDATION: CPAP autotitration is recommended. 5-18 cm water  with 3 cm EPR and patient's choice of interface, heated  humidification.

## 2018-11-13 NOTE — Telephone Encounter (Signed)
Called patient to discuss sleep study results. No answer at this time. LVM for the patient to call back.   

## 2018-11-15 NOTE — Telephone Encounter (Signed)
I called pt. I advised pt that Dr. Brett Fairy reviewed their sleep study results and found that pt has severe sleep apnea. Dr. Brett Fairy recommends that pt starts auto CPAP. I reviewed PAP compliance expectations with the pt. Pt is agreeable to starting a CPAP. I advised pt that an order will be sent to a DME, [facility], and [facility] will call the pt within about one week after they file with the pt's insurance. [facility] will show the pt how to use the machine, fit for masks, and troubleshoot the CPAP if needed. A follow up appt was made for insurance purposes with Janett Billow on April 2,2020 at 10:45 am. Pt verbalized understanding to arrive 15 minutes early and bring their CPAP. A letter with all of this information in it will be mailed to the pt as a reminder. I verified with the pt that the address we have on file is correct. Pt verbalized understanding of results. Pt had no questions at this time but was encouraged to call back if questions arise. I have sent the order to [facility] and have received confirmation that they have received the order.

## 2018-11-17 ENCOUNTER — Other Ambulatory Visit: Payer: Self-pay | Admitting: Nurse Practitioner

## 2018-12-07 ENCOUNTER — Ambulatory Visit (INDEPENDENT_AMBULATORY_CARE_PROVIDER_SITE_OTHER): Payer: Managed Care, Other (non HMO) | Admitting: Pulmonary Disease

## 2018-12-07 ENCOUNTER — Encounter: Payer: Self-pay | Admitting: Pulmonary Disease

## 2018-12-07 VITALS — BP 128/88 | HR 82 | Ht 66.0 in | Wt 294.2 lb

## 2018-12-07 DIAGNOSIS — G4733 Obstructive sleep apnea (adult) (pediatric): Secondary | ICD-10-CM | POA: Diagnosis not present

## 2018-12-07 DIAGNOSIS — R490 Dysphonia: Secondary | ICD-10-CM | POA: Diagnosis not present

## 2018-12-07 DIAGNOSIS — J4550 Severe persistent asthma, uncomplicated: Secondary | ICD-10-CM

## 2018-12-07 MED ORDER — BUDESONIDE-FORMOTEROL FUMARATE 80-4.5 MCG/ACT IN AERO: 2.0000 | INHALATION_SPRAY | Freq: Two times a day (BID) | RESPIRATORY_TRACT | 12 refills | Status: DC

## 2018-12-07 NOTE — Progress Notes (Signed)
PULMONARY OFFICE FOLLOW UP NOTE  Primary MD: Glendale Chard  Date of initial office visit: 06/15/18  Reason for consultation: Post hospitalization after intubation for status asthmaticus  PT PROFILE: 43 y.o. female never smoker admitted to [facility] 8/8-8/14/19 with status asthmaticus. Intubated in ED and briefly required epinephrine an ketamine infusions. Self extubated on 8/11.   DATA: 05/19/18 CTA chest: no acute findings except mild dependent atelectasis 08/07/2018 PFTs: FVC: 2.92 > 2.98 L (91 > 93 %pred), FEV1: 2.30 > 2.47 L (88 > 95% pred), FEV1/FVC: 79%, TLC: 4.5 to L (86 %pred), DLCO 78 %pred  INTERVAL: Last visit 08/16/2018.  In interim, diagnosed with OSA [facility] neurology).  No major pulmonary events  SUBJ:  This is a scheduled follow-up.  She has no new complaints.  She remains on Symbicort for asthma.  She also is on montelukast and levocetirizine.  She rarely uses albuterol.  She continues to have problems with hoarseness which "comes and goes".  She cannot identify any factors that exacerbate her hoarseness.  She denies CP, fever, purulent sputum, hemoptysis, LE edema and calf tenderness.    Vitals:   12/07/18 1055  BP: 128/88  Pulse: 82  SpO2: 100%  Weight: 294 lb 3.2 oz (133.4 kg)  Height: 5\' 6"  (1.676 m)  RA  EXAM:  Obese, NAD HEENT: NCAT, sclerae white Neck: No JVD Lungs: breath sounds full, no wheezes or other adventitious sounds Cardiovascular: RRR, no murmurs Abdomen: Soft, nontender, normal BS Ext: without clubbing, cyanosis, edema Neuro: grossly intact Skin: Limited exam, no lesions noted    DATA:   BMP Latest Ref Rng & Units 10/18/2018 07/11/2018 05/23/2018  Glucose 65 - 99 mg/dL 83 108(H) 83  BUN 6 - 24 mg/dL 8 8 21(H)  Creatinine 0.57 - 1.00 mg/dL 0.73 0.76 0.53  BUN/Creat Ratio 9 - 23 11 11  -  Sodium 134 - 144 mmol/L 140 136 138  Potassium 3.5 - 5.2 mmol/L 4.9 4.8 3.9  Chloride 96 - 106 mmol/L 106 103 110  CO2 20 - 29 mmol/L 21 20 21(L)   Calcium 8.7 - 10.2 mg/dL 9.9 9.8 8.5(L)    CBC Latest Ref Rng & Units 10/18/2018 08/16/2018 07/11/2018  WBC 3.4 - 10.8 x10E3/uL 6.2 6.1 6.1  Hemoglobin 11.1 - 15.9 g/dL 8.2(L) 8.6(L) 9.8(L)  Hematocrit 34.0 - 46.6 % 28.0(L) 28.5(L) 30.8(L)  Platelets 150 - 450 x10E3/uL 615(H) 559(H) -    CXR: No new film  I have personally reviewed all chest radiographs reported above including CXRs and CT chest unless otherwise indicated  IMPRESSION:     ICD-10-CM   1. Severe persistent asthma - presently controlled J45.50   2. Severe obesity E66.01   3. Recent diagnosis of obstructive sleep apnea G47.33    Persistent, intermittent hoarseness raises concern for possibility of vocal cord dysfunction syndrome contributing to her "asthma" symptoms.  VCDS can be exacerbated by inhaled corticosteroids  PLAN:  Continue Symbicort inhaler, 2 actuations twice a day.  She is instructed to rinse mouth after use When she refills her Symbicort, I have changed the prescription to the 80-4.5 strength She is to continue albuterol as needed for increased wheezing, shortness of breath, chest tightness, cough I have congratulated her on her recent efforts at weight loss  She is to follow-up in 6 months and call sooner if needed  Merton Border, MD PCCM service Mobile [phone] Pager [phone] 12/07/2018 10:59 AM

## 2018-12-07 NOTE — Patient Instructions (Addendum)
Continue Symbicort, 2 actuations (puffs) twice a day.  Rinse mouth after use When you refill your Symbicort, it will be at the reduced dose as we discussed Continue albuterol as needed for increased wheezing, shortness of breath, chest tightness, cough Congratulations on your excellent efforts at weight loss.  Keep up the good work  Follow-up in 6 months.  Call sooner if needed for any breathing or respiratory problems

## 2018-12-10 ENCOUNTER — Telehealth: Payer: Self-pay | Admitting: Pulmonary Disease

## 2018-12-10 ENCOUNTER — Emergency Department: Payer: 59

## 2018-12-10 ENCOUNTER — Other Ambulatory Visit: Payer: Self-pay

## 2018-12-10 ENCOUNTER — Emergency Department
Admission: EM | Admit: 2018-12-10 | Discharge: 2018-12-10 | Disposition: A | Discharge status: Home / Self Care | Payer: 59 | Attending: Emergency Medicine | Admitting: Emergency Medicine

## 2018-12-10 ENCOUNTER — Encounter: Payer: Self-pay | Admitting: Emergency Medicine

## 2018-12-10 DIAGNOSIS — Z79899 Other long term (current) drug therapy: Secondary | ICD-10-CM | POA: Insufficient documentation

## 2018-12-10 DIAGNOSIS — J45909 Unspecified asthma, uncomplicated: Secondary | ICD-10-CM | POA: Insufficient documentation

## 2018-12-10 DIAGNOSIS — R0602 Shortness of breath: Secondary | ICD-10-CM | POA: Diagnosis present

## 2018-12-10 DIAGNOSIS — J111 Influenza due to unidentified influenza virus with other respiratory manifestations: Secondary | ICD-10-CM | POA: Diagnosis not present

## 2018-12-10 LAB — CBC
HCT: 28.3 % — ABNORMAL LOW (ref 36.0–46.0)
Hemoglobin: 8 g/dL — ABNORMAL LOW (ref 12.0–15.0)
MCH: 20.9 pg — ABNORMAL LOW (ref 26.0–34.0)
MCHC: 28.3 g/dL — ABNORMAL LOW (ref 30.0–36.0)
MCV: 74.1 fL — ABNORMAL LOW (ref 80.0–100.0)
PLATELETS: 636 10*3/uL — AB (ref 150–400)
RBC: 3.82 MIL/uL — ABNORMAL LOW (ref 3.87–5.11)
RDW: 16.7 % — ABNORMAL HIGH (ref 11.5–15.5)
WBC: 8.6 10*3/uL (ref 4.0–10.5)
nRBC: 0 % (ref 0.0–0.2)

## 2018-12-10 LAB — COMPREHENSIVE METABOLIC PANEL
ALT: 15 U/L (ref 0–44)
AST: 24 U/L (ref 15–41)
Albumin: 4 g/dL (ref 3.5–5.0)
Alkaline Phosphatase: 57 U/L (ref 38–126)
Anion gap: 9 (ref 5–15)
BUN: 7 mg/dL (ref 6–20)
CO2: 22 mmol/L (ref 22–32)
Calcium: 9.3 mg/dL (ref 8.9–10.3)
Chloride: 107 mmol/L (ref 98–111)
Creatinine, Ser: 0.68 mg/dL (ref 0.44–1.00)
GFR calc Af Amer: >60 mL/min (ref >60)
GFR calc non Af Amer: >60 mL/min (ref >60)
Glucose, Bld: 106 mg/dL — ABNORMAL HIGH (ref 70–99)
Potassium: 3.7 mmol/L (ref 3.5–5.1)
Sodium: 138 mmol/L (ref 135–145)
Total Bilirubin: 0.6 mg/dL (ref 0.3–1.2)
Total Protein: 7.5 g/dL (ref 6.5–8.1)

## 2018-12-10 LAB — INFLUENZA PANEL BY PCR (TYPE A & B)
INFLAPCR: POSITIVE — AB
Influenza B By PCR: NEGATIVE

## 2018-12-10 MED ORDER — ALBUTEROL SULFATE (2.5 MG/3ML) 0.083% IN NEBU
5.0000 mg | INHALATION_SOLUTION | Freq: Once | RESPIRATORY_TRACT | Status: AC
  Administered 2018-12-10: 5 mg via RESPIRATORY_TRACT
  Filled 2018-12-10: qty 6

## 2018-12-10 MED ORDER — IPRATROPIUM-ALBUTEROL 0.5-2.5 (3) MG/3ML IN SOLN
3.0000 mL | Freq: Once | RESPIRATORY_TRACT | Status: AC
  Administered 2018-12-10: 3 mL via RESPIRATORY_TRACT
  Filled 2018-12-10: qty 3

## 2018-12-10 MED ORDER — OSELTAMIVIR PHOSPHATE 75 MG PO CAPS: 75.0000 mg | ORAL_CAPSULE | Freq: Two times a day (BID) | ORAL | 0 refills | Status: DC

## 2018-12-10 MED ORDER — PREDNISONE 50 MG PO TABS: 50.0000 mg | ORAL_TABLET | Freq: Every day | ORAL | 0 refills | Status: DC

## 2018-12-10 MED ORDER — ONDANSETRON 4 MG PO TBDP: 4.0000 mg | ORAL_TABLET | Freq: Three times a day (TID) | ORAL | 0 refills | Status: AC | PRN

## 2018-12-10 MED ORDER — METHYLPREDNISOLONE SODIUM SUCC 125 MG IJ SOLR
125.0000 mg | Freq: Once | INTRAMUSCULAR | Status: AC
  Administered 2018-12-10: 125 mg via INTRAVENOUS
  Filled 2018-12-10: qty 2

## 2018-12-10 MED ORDER — OSELTAMIVIR PHOSPHATE 75 MG PO CAPS: 75.0000 mg | ORAL_CAPSULE | Freq: Two times a day (BID) | ORAL | 0 refills | Status: AC

## 2018-12-10 MED ORDER — ONDANSETRON 4 MG PO TBDP: 4.0000 mg | ORAL_TABLET | Freq: Three times a day (TID) | ORAL | 0 refills | Status: DC | PRN

## 2018-12-10 NOTE — ED Notes (Signed)
Patient presents to the ED with shortness of breath x 3 days with cough.  Patient reports feeling extremely fatigued.  Patient reports history of a vocal cord problem.  Patient is wearing a specialty mask from home.

## 2018-12-10 NOTE — ED Provider Notes (Signed)
[facility] Emergency Department Provider Note   ____________________________________________    I have reviewed the triage vital signs and the nursing notes.   HISTORY  Chief Complaint Shortness of Breath     HPI Jasmine Schultz is a 43 y.o. female who presents with complaints of shortness of breath.  Patient reports she suffers from anxiety as well as adult onset asthma and possible vocal cord paralysis.  She reports that she was intubated several months ago and was in a medically induced coma for several days because of asthma.  Has been doing well since then but over the last several days has felt worsening shortness of breath tightness and wheezing with cough.  Unsure about fevers, no significant myalgias however significant other has had influenza.  No calf pain or swelling  Past Medical History:  Diagnosis Date  . Anxiety attack   . Arthritis   . Asthma    diagnosed in march 2019  . Bursitis   . Chronically dry eyes   . GERD (gastroesophageal reflux disease)   . IBS (irritable bowel syndrome)   . Migraine   . Multiple allergies   . Sun allergy   . Tendonitis     Patient Active Problem List   Diagnosis Date Noted  . Severe obstructive sleep apnea 11/12/2018  . Nocturnal hypoxia 11/12/2018  . Snoring 10/18/2018  . Abnormal glucose 10/18/2018  . Chronic GERD 07/11/2018  . History of food allergy 07/09/2018  . Asthma 06/27/2018  . Allergic reaction 06/27/2018  . Acute respiratory failure ([place]) 05/17/2018  . Acute respiratory failure with hypoxia ([place]) 06/06/2015  . Anemia, iron deficiency 06/06/2015  . Reactive airway disease 06/05/2015  . Dyspnea 06/05/2015  . Nausea and vomiting 06/05/2015  . Anxiety 06/05/2015  . Perennial allergic rhinitis 06/05/2015    Past Surgical History:  Procedure Laterality Date  . BREAST REDUCTION SURGERY    . REDUCTION MAMMAPLASTY Bilateral    2002  . TUBAL LIGATION      Prior to Admission  medications   Medication Sig Start Date End Date Taking? Authorizing Provider  albuterol (PROVENTIL HFA;VENTOLIN HFA) 108 (90 BASE) MCG/ACT inhaler Inhale 2 puffs into the lungs every 6 (six) hours as needed for wheezing or shortness of breath. 06/06/15   Janece Canterbury, MD  ALPRAZolam Duanne Moron) 0.25 MG tablet Take 1 tablet (0.25 mg total) by mouth 3 (three) times daily as needed. 10/18/18   [name], FNP  benzonatate (TESSALON) 100 MG capsule Take by mouth 3 (three) times daily as needed for cough.    [provider]  budesonide-formoterol (SYMBICORT) 80-4.5 MCG/ACT inhaler Inhale 2 puffs into the lungs 2 (two) times daily. 12/07/18   Wilhelmina Mcardle, MD  busPIRone (BUSPAR) 5 MG tablet Take 1 tablet (5 mg total) by mouth daily. 10/18/18   [name], FNP  Cholecalciferol (VITAMIN D-3) 1000 units CAPS Take 6,000 Units by mouth daily.     [provider]  clobetasol cream (TEMOVATE) 8.65 % Apply 1 application topically daily as needed.     [provider]  Coenzyme Q10 (CO Q 10) 100 MG CAPS Take 100 mg by mouth daily.     [provider]  EPINEPHrine (EPIPEN 2-PAK) 0.3 mg/0.3 mL IJ SOAJ injection Inject into the muscle once. Inject 0.25ml by intramuscular route once as needed for anaphylaxis    [provider]  ferrous sulfate 325 (65 FE) MG tablet Take 325 mg by mouth daily.  [provider]  Fluocin-Hydroquinone-Tretinoin (TRI-LUMA EX) Apply 1 application topically at bedtime.     [provider]  fluticasone (CUTIVATE) 0.05 % cream Apply 1 application topically daily as needed (unknown indication).     [provider]  Fluticasone Propionate (XHANCE) 93 MCG/ACT EXHU Place 2 sprays into the nose 2 (two) times daily. 07/09/18   Bobbitt, [place] Muta, MD  hydroxychloroquine (PLAQUENIL) 200 MG tablet Take 1 tablet (200 mg total) by mouth 2 (two) times daily. 10/18/18 10/18/19  [name], FNP  Lecithin 1200 MG CAPS Take 1,200 mg  by mouth daily.     [provider]  levocetirizine (XYZAL) 5 MG tablet Take 1 tablet (5 mg total) by mouth every evening. 07/09/18   Bobbitt, [place] Muta, MD  montelukast (SINGULAIR) 10 MG tablet TAKE 1 TABLET BY MOUTH AT BEDTIME 11/19/18   [name], FNP  nystatin (MYCOSTATIN/NYSTOP) 100000 UNIT/GM POWD Apply 1 g topically 2 (two) times daily.     [provider]  ondansetron (ZOFRAN ODT) 4 MG disintegrating tablet Take 1 tablet (4 mg total) by mouth every 8 (eight) hours as needed. 12/10/18   Lavonia Drafts, MD  oseltamivir (TAMIFLU) 75 MG capsule Take 1 capsule (75 mg total) by mouth 2 (two) times daily for 5 days. 12/10/18 12/15/18  Lavonia Drafts, MD  pantoprazole (PROTONIX) 40 MG tablet Take 1 tablet (40 mg total) by mouth 2 (two) times daily. 09/12/18   [name], FNP  polyvinyl alcohol (LIQUIFILM TEARS) 1.4 % ophthalmic solution Place 1 drop into both eyes 4 (four) times daily. 05/23/18   Gladstone Lighter, MD  predniSONE (DELTASONE) 50 MG tablet Take 1 tablet (50 mg total) by mouth daily with breakfast. 12/10/18   Lavonia Drafts, MD  triamcinolone cream (KENALOG) 0.1 % Apply 1 application topically daily as needed (unknown indication).     [provider]     Allergies Tomato; Bacitracin-polymyxin b; Chocolate; Cocoa; Oysters [shellfish allergy]; Peanut butter flavor; and Sulfa antibiotics  Family History  Problem Relation Age of Onset  . Asthma Mother   . Hypertension Mother   . CAD Other   . CVA Other   . Aneurysm Other        brain aneurysm multiple family members  . Breast cancer Other        grandmother  . Sickle cell anemia Other        maternal cousin  . Aneurysm Maternal Aunt   . Diabetes Maternal Aunt   . Diabetes Maternal Uncle   . Diabetes Paternal Aunt   . Heart disease Paternal Uncle   . Diabetes Paternal Uncle   . Breast cancer Maternal Grandmother   . Diabetes Maternal Grandmother   . Heart disease Maternal Grandfather   . Aneurysm  Maternal Grandfather   . Diabetes Maternal Grandfather   . Diabetes Paternal Grandmother   . Heart disease Paternal Grandfather   . Diabetes Paternal Grandfather     Social History Social History   Tobacco Use  . Smoking status: Never Smoker  . Smokeless tobacco: Never Used  Substance Use Topics  . Alcohol use: Yes    Alcohol/week: 0.0 standard drinks    Comment: rarley  . Drug use: No    Review of Systems  Constitutional: No fever/chills Eyes: No visual changes.  ENT: No sore throat. Cardiovascular: Denies chest pain. Respiratory: As above Gastrointestinal: No abdominal pain.  No nausea, no vomiting.   Genitourinary: Negative for dysuria. Musculoskeletal: Negative for back pain. Skin: Negative for  rash. Neurological: Negative for headaches   ____________________________________________   PHYSICAL EXAM:  VITAL SIGNS: ED Triage Vitals  Enc Vitals Group     BP 12/10/18 1353 (!) 166/80     Pulse Rate 12/10/18 1353 (!) 117     Resp 12/10/18 1353 16     Temp 12/10/18 1353 99.3 F (37.4 C)     Temp Source 12/10/18 1353 Oral     SpO2 12/10/18 1353 100 %     Weight 12/10/18 1354 133.4 kg (294 lb)     Height 12/10/18 1354 1.676 m (5\' 6" )     Head Circumference --      Peak Flow --      Pain Score 12/10/18 1358 0     Pain Loc --      Pain Edu? --      Excl. in [place]? --     Constitutional: Alert and oriented.  Eyes: Conjunctivae are normal.   Nose: No congestion/rhinnorhea. Mouth/Throat: Mucous membranes are moist.  Stridor  Cardiovascular: Normal rate, regular rhythm. Grossly normal heart sounds.  Good peripheral circulation. Respiratory: Increased respiratory effort with scattered wheezes Gastrointestinal: Soft and nontender. No distention.  Genitourinary: deferred Musculoskeletal: No lower extremity tenderness nor edema.  Warm and well perfused Neurologic:  Normal speech and language. No gross focal neurologic deficits are appreciated.  Skin:  Skin is  warm, dry and intact. No rash noted. Psychiatric: Mood and affect are normal. Speech and behavior are normal.  ____________________________________________   LABS (all labs ordered are listed, but only abnormal results are displayed)  Labs Reviewed  CBC - Abnormal; Notable for the following components:      Result Value   RBC 3.82 (*)    Hemoglobin 8.0 (*)    HCT 28.3 (*)    MCV 74.1 (*)    MCH 20.9 (*)    MCHC 28.3 (*)    RDW 16.7 (*)    Platelets 636 (*)    All other components within normal limits  COMPREHENSIVE METABOLIC PANEL - Abnormal; Notable for the following components:   Glucose, Bld 106 (*)    All other components within normal limits  INFLUENZA PANEL BY PCR (TYPE A & B) - Abnormal; Notable for the following components:   Influenza A By PCR POSITIVE (*)    All other components within normal limits   ____________________________________________  EKG  ED ECG REPORT I, Lavonia Drafts, the attending physician, personally viewed and interpreted this ECG.  Date: 12/10/2018  Rhythm: Sinus tachycardia QRS Axis: normal Intervals: normal ST/T Wave abnormalities: normal Narrative Interpretation: no evidence of acute ischemia  ____________________________________________  RADIOLOGY  Chest x-ray negative for pneumonia ____________________________________________   PROCEDURES  Procedure(s) performed: No  Procedures   Critical Care performed: No ____________________________________________   INITIAL IMPRESSION / ASSESSMENT AND PLAN / ED COURSE  Pertinent labs & imaging results that were available during my care of the patient were reviewed by me and considered in my medical decision making (see chart for details).  Patient presents with mild shortness of breath, cough, history of asthma with wheezing.  Temperature is mildly elevated but she is tachycardic.  Scattered wheezes on exam, treated with DuoNeb, IV Solu-Medrol with significant  improvement.  Work significant for hemoglobin of 8.0 this appears to gradually be decreasing possibly related to iron deficiency, discussed with her this may be contributing to shortness of breath.  Influenza a is positive this is likely the cause of her elevated temperature and tachycardia.  Given continued  tachycardia I recommended admission to the patient but she declined, she states she would like to go home with Tamiflu   ____________________________________________   FINAL CLINICAL IMPRESSION(S) / ED DIAGNOSES  Final diagnoses:  Influenza  shortness of breath      Note:  This document was prepared using Dragon voice recognition software and may include unintentional dictation errors.   Lavonia Drafts, MD 12/10/18 2022

## 2018-12-10 NOTE — Telephone Encounter (Signed)
I spoke with Dr. Alva Garnet, he is aware.

## 2018-12-10 NOTE — ED Triage Notes (Signed)
PT c/o SOB xfew days, hx of asthma. PT states inhalers at home not giving any relief. PT able to speak in full sentences. + cough

## 2018-12-12 ENCOUNTER — Other Ambulatory Visit: Payer: Self-pay

## 2018-12-12 MED ORDER — FLUTICASONE PROPIONATE 93 MCG/ACT NA EXHU: 2.0000 | INHALANT_SUSPENSION | Freq: Two times a day (BID) | NASAL | 5 refills | Status: DC

## 2018-12-19 ENCOUNTER — Ambulatory Visit: Payer: Managed Care, Other (non HMO) | Admitting: Nurse Practitioner

## 2018-12-19 ENCOUNTER — Encounter: Payer: Self-pay | Admitting: Nurse Practitioner

## 2018-12-19 ENCOUNTER — Other Ambulatory Visit: Payer: Self-pay

## 2018-12-19 VITALS — BP 126/74 | HR 88 | Temp 98.9°F | Wt 289.2 lb

## 2018-12-19 DIAGNOSIS — R7989 Other specified abnormal findings of blood chemistry: Secondary | ICD-10-CM

## 2018-12-19 DIAGNOSIS — D509 Iron deficiency anemia, unspecified: Secondary | ICD-10-CM | POA: Diagnosis not present

## 2018-12-19 DIAGNOSIS — Z87898 Personal history of other specified conditions: Secondary | ICD-10-CM

## 2018-12-19 DIAGNOSIS — Z09 Encounter for follow-up examination after completed treatment for conditions other than malignant neoplasm: Secondary | ICD-10-CM | POA: Diagnosis not present

## 2018-12-19 MED ORDER — FERRALET 90 90-1 MG PO TABS: 1.0000 | ORAL_TABLET | Freq: Every day | ORAL | 3 refills | Status: DC

## 2018-12-19 NOTE — Progress Notes (Signed)
Subjective:     Patient ID: Jasmine Schultz , female    DOB: November 27, 1975 , 43 y.o.   MRN: [id number]   Chief Complaint  Patient presents with  . follow up ed visit for the flu    HPI  Here for follow up ER visit for shortness of breath and found out she had Influenza A. She was given 2 breathing treatments.  She is now feeling better.     Past Medical History:  Diagnosis Date  . Anxiety attack   . Arthritis   . Asthma    diagnosed in march 2019  . Bursitis   . Chronically dry eyes   . GERD (gastroesophageal reflux disease)   . IBS (irritable bowel syndrome)   . Migraine   . Multiple allergies   . Sun allergy   . Tendonitis      Family History  Problem Relation Age of Onset  . Asthma Mother   . Hypertension Mother   . CAD Other   . CVA Other   . Aneurysm Other        brain aneurysm multiple family members  . Breast cancer Other        grandmother  . Sickle cell anemia Other        maternal cousin  . Aneurysm Maternal Aunt   . Diabetes Maternal Aunt   . Diabetes Maternal Uncle   . Diabetes Paternal Aunt   . Heart disease Paternal Uncle   . Diabetes Paternal Uncle   . Breast cancer Maternal Grandmother   . Diabetes Maternal Grandmother   . Heart disease Maternal Grandfather   . Aneurysm Maternal Grandfather   . Diabetes Maternal Grandfather   . Diabetes Paternal Grandmother   . Heart disease Paternal Grandfather   . Diabetes Paternal Grandfather      Current Outpatient Medications:  .  albuterol (PROVENTIL HFA;VENTOLIN HFA) 108 (90 BASE) MCG/ACT inhaler, Inhale 2 puffs into the lungs every 6 (six) hours as needed for wheezing or shortness of breath., Disp: 1 Inhaler, Rfl: 0 .  ALPRAZolam (XANAX) 0.25 MG tablet, Take 1 tablet (0.25 mg total) by mouth 3 (three) times daily as needed., Disp: 30 tablet, Rfl: 2 .  benzonatate (TESSALON) 100 MG capsule, Take by mouth 3 (three) times daily as needed for cough., Disp: , Rfl:  .  budesonide-formoterol (SYMBICORT)  80-4.5 MCG/ACT inhaler, Inhale 2 puffs into the lungs 2 (two) times daily., Disp: 1 Inhaler, Rfl: 12 .  busPIRone (BUSPAR) 5 MG tablet, Take 1 tablet (5 mg total) by mouth daily., Disp: 90 tablet, Rfl: 1 .  Cholecalciferol (VITAMIN D-3) 1000 units CAPS, Take 6,000 Units by mouth daily. , Disp: , Rfl:  .  clobetasol cream (TEMOVATE) 2.33 %, Apply 1 application topically daily as needed. , Disp: , Rfl: 3 .  Coenzyme Q10 (CO Q 10) 100 MG CAPS, Take 100 mg by mouth daily. , Disp: , Rfl:  .  EPINEPHrine (EPIPEN 2-PAK) 0.3 mg/0.3 mL IJ SOAJ injection, Inject into the muscle once. Inject 0.25ml by intramuscular route once as needed for anaphylaxis, Disp: , Rfl:  .  ferrous sulfate 325 (65 FE) MG tablet, Take 325 mg by mouth daily. , Disp: , Rfl:  .  Fluocin-Hydroquinone-Tretinoin (TRI-LUMA EX), Apply 1 application topically at bedtime. , Disp: , Rfl:  .  fluticasone (CUTIVATE) 0.05 % cream, Apply 1 application topically daily as needed (unknown indication). , Disp: , Rfl:  .  Fluticasone Propionate (XHANCE) 93 MCG/ACT EXHU, Place 2  sprays into the nose 2 (two) times daily., Disp: 16 mL, Rfl: 5 .  hydroxychloroquine (PLAQUENIL) 200 MG tablet, Take 1 tablet (200 mg total) by mouth 2 (two) times daily., Disp: 60 tablet, Rfl: 2 .  Lecithin 1200 MG CAPS, Take 1,200 mg by mouth daily. , Disp: , Rfl:  .  levocetirizine (XYZAL) 5 MG tablet, Take 1 tablet (5 mg total) by mouth every evening., Disp: 30 tablet, Rfl: 5 .  montelukast (SINGULAIR) 10 MG tablet, TAKE 1 TABLET BY MOUTH AT BEDTIME, Disp: 90 tablet, Rfl: 0 .  nystatin (MYCOSTATIN/NYSTOP) 100000 UNIT/GM POWD, Apply 1 g topically 2 (two) times daily. , Disp: , Rfl: 1 .  ondansetron (ZOFRAN ODT) 4 MG disintegrating tablet, Take 1 tablet (4 mg total) by mouth every 8 (eight) hours as needed., Disp: 20 tablet, Rfl: 0 .  pantoprazole (PROTONIX) 40 MG tablet, Take 1 tablet (40 mg total) by mouth 2 (two) times daily., Disp: 180 tablet, Rfl: 1 .  polyvinyl alcohol  (LIQUIFILM TEARS) 1.4 % ophthalmic solution, Place 1 drop into both eyes 4 (four) times daily., Disp: 15 mL, Rfl: 0 .  predniSONE (DELTASONE) 50 MG tablet, Take 1 tablet (50 mg total) by mouth daily with breakfast., Disp: 5 tablet, Rfl: 0 .  triamcinolone cream (KENALOG) 0.1 %, Apply 1 application topically daily as needed (unknown indication). , Disp: , Rfl:    Allergies  Allergen Reactions  . Tomato Anaphylaxis and Rash  . Bacitracin-Polymyxin B     Band aids cause- dark spots on her skin and itching  . Chocolate   . Cocoa   . Oysters [Shellfish Allergy] Hives  . Peanut Butter Flavor     Feels congested  . Sulfa Antibiotics Hives     Review of Systems  Constitutional: Negative for fatigue and fever.  HENT: Negative.   Eyes: Negative for photophobia, pain, discharge, redness, itching and visual disturbance.  Respiratory: Negative.  Negative for cough.   Cardiovascular: Negative.  Negative for chest pain, palpitations and leg swelling.  Neurological: Negative for dizziness and headaches.     Today's Vitals   12/19/18 1008  BP: 126/74  Pulse: 88  Temp: 98.9 F (37.2 C)  TempSrc: Oral  SpO2: 98%  Weight: 289 lb 3.2 oz (131.2 kg)   Body mass index is 46.68 kg/m.   Objective:  Physical Exam Vitals signs reviewed.  Constitutional:      Appearance: Normal appearance.  Cardiovascular:     Rate and Rhythm: Normal rate and regular rhythm.     Pulses: Normal pulses.     Heart sounds: Normal heart sounds. No murmur.  Pulmonary:     Effort: Pulmonary effort is normal. No respiratory distress.     Breath sounds: Normal breath sounds. No wheezing or rales.  Skin:    Capillary Refill: Capillary refill takes less than 2 seconds.  Neurological:     General: No focal deficit present.     Mental Status: She is alert.  Psychiatric:        Mood and Affect: Mood normal.        Behavior: Behavior normal.        Thought Content: Thought content normal.        Judgment: Judgment  normal.         Assessment And Plan:     1. Iron deficiency anemia, unspecified iron deficiency anemia type  Her Hgb at the ER on 12/10/2018 was down to 8.0, she has been trending down over the  last 4-5 months  I will refer her to hematology for further evaluation  She is a previous Jehovah's witness and does not want whole blood willing to use expanders.  - Fe Cbn-Fe Gluc-FA-B12-C-DSS (FERRALET 90) 90-1 MG TABS; Take 1 tablet by mouth daily.  Dispense: 30 each; Refill: 3 - Ambulatory referral to Hematology  2. Elevated platelet count  This is elevating over time.    This can be related to obesity however she has been losing weight. - Ambulatory referral to Hematology    [name], FNP

## 2019-01-10 ENCOUNTER — Other Ambulatory Visit: Payer: Self-pay

## 2019-01-10 ENCOUNTER — Encounter: Payer: Self-pay | Admitting: Family Medicine

## 2019-01-10 ENCOUNTER — Ambulatory Visit (INDEPENDENT_AMBULATORY_CARE_PROVIDER_SITE_OTHER): Payer: Managed Care, Other (non HMO) | Admitting: Family Medicine

## 2019-01-10 DIAGNOSIS — G4733 Obstructive sleep apnea (adult) (pediatric): Secondary | ICD-10-CM

## 2019-01-10 DIAGNOSIS — Z9989 Dependence on other enabling machines and devices: Secondary | ICD-10-CM

## 2019-01-10 NOTE — Progress Notes (Signed)
PATIENT: Jasmine Schultz DOB: May 30, 1976  REASON FOR VISIT: follow up HISTORY FROM: patient  Virtual Visit via Telephone Note  I connected with Jasmine Schultz on 01/10/19 at 10:45 AM EDT by telephone and verified that I am speaking with the correct person using two identifiers.   I discussed the limitations, risks, security and privacy concerns of performing an evaluation and management service by telephone and the availability of in person appointments. I also discussed with the patient that there may be a patient responsible charge related to this service. The patient expressed understanding and agreed to proceed.   History of Present Illness:  01/10/19 Jasmine Schultz is a 43 y.o. female for follow up of OSA on CPAP.  She reports feeling so much better since starting CPAP therapy.  She is sleeping longer than she has in the past.  She is less tired during the day.  She does express some concern regarding a couple of days where her machine reported that she had only used it for a couple hours.  She reports that each night she has used her machine greater than 4 hours.  I have reviewed her download which reveals on March 2, third, seventh and the 28th her usage hours were lower than she reports.  Otherwise she has tolerated the machine well and is very happy with the improvement in her sleep.  12/10/2018 - 01/08/2019 01/08/2019 Usage days 30/30 days (100%) >= 4 hours 26 days (87%) < 4 hours 4 days (13%) Usage hours 241 hours 49 minutes Average usage (total days) 8 hours 4 minutes Average usage (days used) 8 hours 4 minutes Median usage (days used) 8 hours 30 minutes Total used hours (value since last reset - 01/08/2019) 326 hours AirSense 10 AutoSet Serial number [id number] Mode AutoSet Min Pressure 5 cmH2O Max Pressure 18 cmH2O EPR Fulltime EPR level 3 Response Standard Therapy Pressure - cmH2O Median: 9.9 95th percentile: 12.1 Maximum: 13.3 Leaks - L/min Median: 2.0 95th  percentile: 12.7 Maximum: 18.7 Events per hour AI: 0.2 HI: 0.2 AHI: 0.4 Apnea Index Central: 0.1 Obstructive: 0.1 Unknown: 0.0 RERA Index 0.1 Cheyne-Stokes respiration (average duration per night) 0 minutes (0%)    History (copied from Dr Edwena Felty note on 08/01/2018)  HPI:  Jasmine Schultz is a 43 y.o. female patient, seen here on 08-01-2018, as in a referral from Dr. Baird Cancer.  Chief complaint according to patient : Mrs. Pantoja states that on May 17, 2018 she was admitted to hospital after a life threatening asthma attack, a condition that had not been previously diagnosed.  She has seen a pulmonologist, Dr. Jamal Collin, who is now suggesting that she undergoes a sleep study just as her primary care provider Darleen Crocker has done.  She had acute respiratory failure with documented hypoxemia . She knows that she is snoring but she has not been referred to as having apneas.  She was ventilated for 5 days during her hospital stay, and accordingly sedated. She has never been able to sleep on her back, and as she got heavier she has still not been resuming the sleep position.  Her husband however has noted that she has been wheezing every night, every day.  She was given prednisone and Symbicort after her hospital discharge which has made her feel more jittery, but she does have a pre-existing diagnosis of anxiety.  Sleeping has also been affected by this anxiety. She is also a night shift worker which affects the overall sleep time she  can get and she has been sleep deprived for many ( 15 years ).  Sleep habits are as follows: she returns from work at 6.30am, eats Leonel [employer]ay -does some house work, goes to bed at 7.30- the bedroom is cool, quite and dark with an airfilter, curtains block light, and she uses ear plugs. Sleeps prone or on her sides.  She sleeps on one pillow.  She uses a sleep number bed, she rises between 12 and 2 pm, but sometimes she wakes up choking, and has attributed this to GERD. She  has some weird dreams, but often doesn't remember. Her husband comes back at 2 from his work- and usually that wakes her. Sleeps again from 4-5 Pm and at 8 is back at work. Eats again at work at 10-11 o'clock.   Sleep medical history and family sleep history:  Mother has orthopnea. Sleeps in a recliner, otherwise unable to breath - wakes up choking. Maternal grandmother had breast cancer, paternal and maternal family members have heart disease, aneurysms maternal family, diabetes mellitus on both sides, hypertension on both sides, asthma on the maternal side, strokes on both sides, autoimmune diseases on the maternal side.  She has asthma, connective tissue disease, UV sensitivity, osteoarthritis, anxiety, lumbar disc degeneration, back pain, obesity, GERD.    Social history: married, shift Insurance underwriter. No children. One cat.  Caffeine- none, ETOH- seldomly, non smoker- no passive smoking.    Observations/Objective:  Generalized: Well developed, in no acute distress  Mentation: Alert oriented to time, place, history taking. Follows all commands speech and language fluent   Assessment and Plan:  43 y.o. year old female  has a past medical history of Anxiety attack, Arthritis, Asthma, Bursitis, Chronically dry eyes, GERD (gastroesophageal reflux disease), IBS (irritable bowel syndrome), Migraine, Multiple allergies, Sun allergy, and Tendonitis. with    ICD-10-CM   1. OSA on CPAP G47.33 For home use only DME continuous positive airway pressure (CPAP)   Z99.89   2. Severe obstructive sleep apnea G47.33 For home use only DME continuous positive airway pressure (CPAP)   She is compliant with CPAP usage and tolerating well.  I will send an order for DME supplies as well as asked area care to assist patient with discrepancy in usage hours.  I have educated her on the importance of continuing CPAP every night and for greater than 4 hours every night. She verbalizes understanding and agreement with the  plan.  We will follow-up with Jasmine Schultz in 1 year, sooner if needed.   Orders Placed This Encounter  Procedures  . For home use only DME continuous positive airway pressure (CPAP)    Supplies please, patient also requesting assistance with machine not reading hours used accurately.    Order Specific Question:   Patient has OSA or probable OSA    Answer:   Yes    Order Specific Question:   Settings    Answer:   Other see comments    Order Specific Question:   CPAP supplies needed    Answer:   Mask, headgear, cushions, filters, heated tubing and water chamber    No orders of the defined types were placed in this encounter.    Follow Up Instructions:  I discussed the assessment and treatment plan with the patient. The patient was provided an opportunity to ask questions and all were answered. The patient agreed with the plan and demonstrated an understanding of the instructions.   The patient was advised to call back or seek an  in-person evaluation if the symptoms worsen or if the condition fails to improve as anticipated.  I provided 25 minutes of non-face-to-face time during this encounter. Patient is located at her place of residence during visit, provider at her place of residence.    Debbora Presto, NP

## 2019-01-21 ENCOUNTER — Other Ambulatory Visit: Payer: Self-pay | Admitting: Allergy and Immunology

## 2019-01-25 ENCOUNTER — Telehealth: Payer: Self-pay | Admitting: Allergy and Immunology

## 2019-01-25 NOTE — Telephone Encounter (Signed)
PT called in to get xhance refills. Per Morey Hummingbird,  referred pt to call [facility] [phone].

## 2019-02-08 ENCOUNTER — Encounter: Payer: Self-pay | Admitting: Nurse Practitioner

## 2019-02-15 ENCOUNTER — Other Ambulatory Visit: Payer: Self-pay | Admitting: Nurse Practitioner

## 2019-02-18 ENCOUNTER — Telehealth: Payer: Self-pay | Admitting: Family Medicine

## 2019-02-18 NOTE — Telephone Encounter (Signed)
Left message on pt's VM to schedule 1 yr f/u with Amy

## 2019-03-18 ENCOUNTER — Other Ambulatory Visit: Payer: Self-pay | Admitting: Allergy and Immunology

## 2019-04-01 ENCOUNTER — Other Ambulatory Visit: Payer: Self-pay | Admitting: *Deleted

## 2019-04-01 MED ORDER — LEVOCETIRIZINE DIHYDROCHLORIDE 5 MG PO TABS: 5.0000 mg | ORAL_TABLET | Freq: Every evening | ORAL | 2 refills | Status: DC

## 2019-04-11 ENCOUNTER — Other Ambulatory Visit: Payer: Self-pay | Admitting: Allergy and Immunology

## 2019-05-02 ENCOUNTER — Other Ambulatory Visit: Payer: Self-pay | Admitting: Allergy and Immunology

## 2019-05-13 ENCOUNTER — Other Ambulatory Visit: Payer: Self-pay | Admitting: Nurse Practitioner

## 2019-05-13 DIAGNOSIS — D509 Iron deficiency anemia, unspecified: Secondary | ICD-10-CM

## 2019-05-27 ENCOUNTER — Other Ambulatory Visit: Payer: Self-pay | Admitting: Allergy and Immunology

## 2019-06-04 ENCOUNTER — Other Ambulatory Visit: Payer: Self-pay | Admitting: Allergy and Immunology

## 2019-06-13 ENCOUNTER — Other Ambulatory Visit: Payer: Self-pay | Admitting: Nurse Practitioner

## 2019-06-13 DIAGNOSIS — D509 Iron deficiency anemia, unspecified: Secondary | ICD-10-CM

## 2019-07-04 ENCOUNTER — Other Ambulatory Visit: Payer: Self-pay | Admitting: Allergy and Immunology

## 2019-07-19 ENCOUNTER — Other Ambulatory Visit: Payer: Self-pay | Admitting: Nurse Practitioner

## 2019-07-19 DIAGNOSIS — D509 Iron deficiency anemia, unspecified: Secondary | ICD-10-CM

## 2019-07-24 ENCOUNTER — Telehealth: Payer: Self-pay

## 2019-07-24 NOTE — Telephone Encounter (Signed)
Spoke to pt. To see what other nasal sprays she has used in the past before trying to PA xhance for the pt. The pt. no longer has insurance and it will probably take a month or so to get on her husband's insurance. I called and spoke to [facility] at the [place] office and she has 2 xhance samples for the pt. I spoke to [facility] at the [place] office and she will bring xhance/xyzal to the [place] office for pt. Jasmine Schultz. Pt. Is aware and pick up sample sometime this week.

## 2019-08-26 ENCOUNTER — Other Ambulatory Visit: Payer: Self-pay | Admitting: Nurse Practitioner

## 2019-08-26 DIAGNOSIS — D509 Iron deficiency anemia, unspecified: Secondary | ICD-10-CM

## 2019-09-06 ENCOUNTER — Telehealth: Payer: Self-pay | Admitting: Internal Medicine

## 2019-09-06 ENCOUNTER — Other Ambulatory Visit: Payer: Self-pay

## 2019-09-06 NOTE — Telephone Encounter (Signed)
Received PA request from [facility] for Symbicort 80. Covered alternatives are Symbicort 160, Advair, Dulera, Breo and Wixela. Pt is past due for an appt, as she was due 05/2019.  Lm for pt to schedule OV.

## 2019-09-09 NOTE — Telephone Encounter (Signed)
Pt has been scheduled for phone visit on 09/12/2019 at 10:00.  DK please advise on alternative. Thanks

## 2019-09-11 NOTE — Telephone Encounter (Signed)
Left message for pt

## 2019-09-11 NOTE — Telephone Encounter (Signed)
symbicort 160 

## 2019-09-13 ENCOUNTER — Encounter: Payer: Self-pay | Admitting: Internal Medicine

## 2019-09-13 ENCOUNTER — Ambulatory Visit (INDEPENDENT_AMBULATORY_CARE_PROVIDER_SITE_OTHER): Payer: BC Managed Care – PPO | Admitting: Internal Medicine

## 2019-09-13 ENCOUNTER — Telehealth: Payer: Self-pay | Admitting: Internal Medicine

## 2019-09-13 DIAGNOSIS — J4521 Mild intermittent asthma with (acute) exacerbation: Secondary | ICD-10-CM

## 2019-09-13 DIAGNOSIS — J4550 Severe persistent asthma, uncomplicated: Secondary | ICD-10-CM | POA: Diagnosis not present

## 2019-09-13 MED ORDER — PREDNISONE 20 MG PO TABS: 20.0000 mg | ORAL_TABLET | Freq: Every day | ORAL | 0 refills | Status: DC

## 2019-09-13 MED ORDER — XHANCE 93 MCG/ACT NA EXHU: 2.0000 | INHALANT_SUSPENSION | Freq: Two times a day (BID) | NASAL | 6 refills | Status: DC

## 2019-09-13 MED ORDER — BUDESONIDE-FORMOTEROL FUMARATE 160-4.5 MCG/ACT IN AERO: 2.0000 | INHALATION_SPRAY | Freq: Two times a day (BID) | RESPIRATORY_TRACT | 12 refills | Status: DC

## 2019-09-13 MED ORDER — LEVOCETIRIZINE DIHYDROCHLORIDE 5 MG PO TABS: 5.0000 mg | ORAL_TABLET | Freq: Every evening | ORAL | 10 refills | Status: AC

## 2019-09-13 MED ORDER — AZITHROMYCIN 250 MG PO TABS: ORAL_TABLET | ORAL | 0 refills | Status: DC

## 2019-09-13 MED ORDER — BUDESONIDE 32 MCG/ACT NA SUSP: 2.0000 | Freq: Every day | NASAL | 2 refills | Status: DC

## 2019-09-13 NOTE — Telephone Encounter (Signed)
Pt has OV on 09/13/2019 and medication change was discussed. Will close encounter.

## 2019-09-13 NOTE — Telephone Encounter (Signed)
Pt is aware of below message and voiced her understanding.  Rx for Budesonide has been sent to preferred pharmacy. Nothing further is needed.

## 2019-09-13 NOTE — Telephone Encounter (Signed)
Pt called and said that the [place] needed a PA.

## 2019-09-13 NOTE — Progress Notes (Signed)
PULMONARY OFFICE FOLLOW UP NOTE    I connected with the patient by telephone enabled telemedicine visit and verified that I am speaking with the correct person using two identifiers.    I discussed the limitations, risks, security and privacy concerns of performing an evaluation and management service by telemedicine and the availability of in-person appointments. I also discussed with the patient that there may be a patient responsible charge related to this service. The patient expressed understanding and agreed to proceed.  PATIENT AGREES AND CONFIRMS -YES   Other persons participating in the visit and their role in the encounter: Patient, nursing  This visit type was conducted due to national recommendations for restrictions regarding the COVID-19 Pandemic (e.g. social distancing).  This format is felt to be most appropriate for this patient at this time.  All issues noted in this document were discussed and addressed.       Primary MD: Glendale Chard  Date of initial office visit: 06/15/18  Reason for consultation: Post hospitalization after intubation for status asthmaticus  PT PROFILE: 43 y.o. female never smoker admitted to [facility] 8/8-8/14/19 with status asthmaticus. Intubated in ED and briefly required epinephrine an ketamine infusions. Self extubated on 8/11.   DATA: 05/19/18 CTA chest: no acute findings except mild dependent atelectasis 08/07/2018 PFTs: FVC: 2.92 > 2.98 L (91 > 93 %pred), FEV1: 2.30 > 2.47 L (88 > 95% pred), FEV1/FVC: 79%, TLC: 4.5 to L (86 %pred), DLCO 78 %pred  INTERVAL: Last visit 08/16/2018.  In interim, diagnosed with OSA [facility] neurology).  No major pulmonary events  SYNOPSIS  She remains on Symbicort for asthma.  She also is on montelukast and levocetirizine.  She rarely uses albuterol.  She continues to have problems with hoarseness which "comes and goes".  She cannot identify any factors that exacerbate her hoarseness.  She denies CP, fever,  purulent sputum, hemoptysis, LE edema and calf tenderness. Persistent, intermittent hoarseness raises concern for possibility of vocal cord dysfunction syndrome contributing to her "asthma" symptoms.  VCDS can be exacerbated by inhaled corticosteroids  CC Follow asthma  HPI Dx of ASTHMA Previous VENT in 05/2018 Been on inhalers-symbicort and albuterol as needed  +signs of exacerbation +wheezing +fevers +productive cough  Review of Systems:  Gen:  Denies  fever, sweats, chills weight loss  HEENT: Denies blurred vision, double vision, ear pain, eye pain, hearing loss, nose bleeds, sore throat Cardiac:  No dizziness, chest pain or heaviness, chest tightness,edema, No JVD Resp:   +cough, +sputum production, +shortness of breath,+wheezing, -hemoptysis,  Gi: Denies swallowing difficulty, stomach pain, nausea or vomiting, diarrhea, constipation, bowel incontinence Gu:  Denies bladder incontinence, burning urine Ext:   Denies Joint pain, stiffness or swelling Skin: Denies  skin rash, easy bruising or bleeding or hives Endoc:  Denies polyuria, polydipsia , polyphagia or weight change Psych:   Denies depression, insomnia or hallucinations  Other:  All other systems negative    DATA:   BMP Latest Ref Rng & Units 12/10/2018 10/18/2018 07/11/2018  Glucose 70 - 99 mg/dL 106(H) 83 108(H)  BUN 6 - 20 mg/dL 7 8 8   Creatinine 0.44 - 1.00 mg/dL 0.68 0.73 0.76  BUN/Creat Ratio 9 - 23 - 11 11  Sodium 135 - 145 mmol/L 138 140 136  Potassium 3.5 - 5.1 mmol/L 3.7 4.9 4.8  Chloride 98 - 111 mmol/L 107 106 103  CO2 22 - 32 mmol/L 22 21 20   Calcium 8.9 - 10.3 mg/dL 9.3 9.9 9.8    CBC  Latest Ref Rng & Units 12/10/2018 10/18/2018 08/16/2018  WBC 4.0 - 10.5 K/uL 8.6 6.2 6.1  Hemoglobin 12.0 - 15.0 g/dL 8.0(L) 8.2(L) 8.6(L)  Hematocrit 36.0 - 46.0 % 28.3(L) 28.0(L) 28.5(L)  Platelets 150 - 400 K/uL 636(H) 615(H) 559(H)     IMPRESSION:   ASTHMA exacerbation from acute bronchitis Start prednisone 20 mh  daily for  10 days z pak Continue Symbicort as prescribed (80 dose for maintenance, 160 dose during pollen season) check EOS and IGE levels  +allergy to cats(patient advised to avoid cats and triggers)   COVID-19 EDUCATION: The signs and symptoms of COVID-19 were discussed with the patient and how to seek care for testing.  The importance of social distancing was discussed today. Hand Washing Techniques and avoid touching face was advised.     MEDICATION ADJUSTMENTS/LABS AND TESTS ORDERED: Prednisone 20 mg daily for 10 days Z pak Refilled allergy meds   CURRENT MEDICATIONS REVIEWED AT LENGTH WITH PATIENT TODAY   Patient satisfied with Plan of action and management. All questions answered  Follow up in 6 months  Total Time Spent 23 mins   Maretta Bees Patricia Pesa, M.D.  [facility]  Medical Director [place] Director [facility] Cardio-Pulmonary Department

## 2019-09-13 NOTE — Telephone Encounter (Signed)
Received PA request for xchance nasal spray. Covered alternatives are  Fluticasonpropionate, flunisolide, budesonide, and memetasonefuroate.   PA for symbicort 160 has been [facility] with [facility] P[facility] and has been approved until 09/12/2020.  [id number]. Left message for [facility] pharmacy and made them of approval.  DK please advise on alternative.

## 2019-09-13 NOTE — Telephone Encounter (Signed)
budesonide 

## 2019-09-13 NOTE — Telephone Encounter (Signed)
Pt is aware that we are currently awaiting response from [facility] regarding this.

## 2019-09-13 NOTE — Patient Instructions (Signed)
MEDICATION ADJUSTMENTS/LABS AND TESTS ORDERED: Prednisone 20 mg daily for 10 days Z pak Refilled allergy meds  COVID-19 COVID-19 is a respiratory infection that is caused by a virus called severe acute respiratory syndrome coronavirus 2 (SARS-CoV-2). The disease is also known as coronavirus disease or novel coronavirus. In some people, the virus may not cause any symptoms. In others, it may cause a serious infection. The infection can get worse quickly and can lead to complications, such as:  Pneumonia, or infection of the lungs.  Acute respiratory distress syndrome or ARDS. This is fluid build-up in the lungs.  Acute respiratory failure. This is a condition in which there is not enough oxygen passing from the lungs to the body.  Sepsis or septic shock. This is a serious bodily reaction to an infection.  Blood clotting problems.  Secondary infections due to bacteria or fungus. The virus that causes COVID-19 is contagious. This means that it can spread from person to person through droplets from coughs and sneezes (respiratory secretions). What are the causes? This illness is caused by a virus. You may catch the virus by:  Breathing in droplets from an infected person's cough or sneeze.  Touching something, like a table or a doorknob, that was exposed to the virus (contaminated) and then touching your mouth, nose, or eyes. What increases the risk? Risk for infection You are more likely to be infected with this virus if you:  Live in or travel to an area with a COVID-19 outbreak.  Come in contact with a sick person who recently traveled to an area with a COVID-19 outbreak.  Provide care for or live with a person who is infected with COVID-19. Risk for serious illness You are more likely to become seriously ill from the virus if you:  Are 45 years of age or older.  Have a long-term disease that lowers your body's ability to fight infection (immunocompromised).  Live in a  nursing home or long-term care facility.  Have a long-term (chronic) disease such as: ? Chronic lung disease, including chronic obstructive pulmonary disease or asthma ? Heart disease. ? Diabetes. ? Chronic kidney disease. ? Liver disease.  Are obese. What are the signs or symptoms? Symptoms of this condition can range from mild to severe. Symptoms may appear any time from 2 to 14 days after being exposed to the virus. They include:  A fever.  A cough.  Difficulty breathing.  Chills.  Muscle pains.  A sore throat.  Loss of taste or smell. Some people may also have stomach problems, such as nausea, vomiting, or diarrhea. Other people may not have any symptoms of COVID-19. How is this diagnosed? This condition may be diagnosed based on:  Your signs and symptoms, especially if: ? You live in an area with a COVID-19 outbreak. ? You recently traveled to or from an area where the virus is common. ? You provide care for or live with a person who was diagnosed with COVID-19.  A physical exam.  Lab tests, which may include: ? A nasal swab to take a sample of fluid from your nose. ? A throat swab to take a sample of fluid from your throat. ? A sample of mucus from your lungs (sputum). ? Blood tests.  Imaging tests, which may include, X-rays, CT scan, or ultrasound. How is this treated? At present, there is no medicine to treat COVID-19. Medicines that treat other diseases are being used on a trial basis to see if they are effective  against COVID-19. Your health care provider will talk with you about ways to treat your symptoms. For most people, the infection is mild and can be managed at home with rest, fluids, and over-the-counter medicines. Treatment for a serious infection usually takes places in a hospital intensive care unit (ICU). It may include one or more of the following treatments. These treatments are given until your symptoms improve.  Receiving fluids and  medicines through an IV.  Supplemental oxygen. Extra oxygen is given through a tube in the nose, a face mask, or a hood.  Positioning you to lie on your stomach (prone position). This makes it easier for oxygen to get into the lungs.  Continuous positive airway pressure (CPAP) or bi-level positive airway pressure (BPAP) machine. This treatment uses mild air pressure to keep the airways open. A tube that is connected to a motor delivers oxygen to the body.  Ventilator. This treatment moves air into and out of the lungs by using a tube that is placed in your windpipe.  Tracheostomy. This is a procedure to create a hole in the neck so that a breathing tube can be inserted.  Extracorporeal membrane oxygenation (ECMO). This procedure gives the lungs a chance to recover by taking over the functions of the heart and lungs. It supplies oxygen to the body and removes carbon dioxide. Follow these instructions at home: Lifestyle  If you are sick, stay home except to get medical care. Your health care provider will tell you how long to stay home. Call your health care provider before you go for medical care.  Rest at home as told by your health care provider.  Do not use any products that contain nicotine or tobacco, such as cigarettes, e-cigarettes, and chewing tobacco. If you need help quitting, ask your health care provider.  Return to your normal activities as told by your health care provider. Ask your health care provider what activities are safe for you. General instructions  Take over-the-counter and prescription medicines only as told by your health care provider.  Drink enough fluid to keep your urine pale yellow.  Keep all follow-up visits as told by your health care provider. This is important. How is this prevented?  There is no vaccine to help prevent COVID-19 infection. However, there are steps you can take to protect yourself and others from this virus. To protect yourself:    Do not travel to areas where COVID-19 is a risk. The areas where COVID-19 is reported change often. To identify high-risk areas and travel restrictions, check the CDC travel website: FatFares.com.br  If you live in, or must travel to, an area where COVID-19 is a risk, take precautions to avoid infection. ? Stay away from people who are sick. ? Wash your hands often with soap and water for 20 seconds. If soap and water are not available, use an alcohol-based hand sanitizer. ? Avoid touching your mouth, face, eyes, or nose. ? Avoid going out in public, follow guidance from your state and local health authorities. ? If you must go out in public, wear a cloth face covering or face mask. ? Disinfect objects and surfaces that are frequently touched every day. This may include:  Counters and tables.  Doorknobs and light switches.  Sinks and faucets.  Electronics, such as phones, remote controls, keyboards, computers, and tablets. To protect others: If you have symptoms of COVID-19, take steps to prevent the virus from spreading to others.  If you think you have a COVID-19  infection, contact your health care provider right away. Tell your health care team that you think you may have a COVID-19 infection.  Stay home. Leave your house only to seek medical care. Do not use public transport.  Do not travel while you are sick.  Wash your hands often with soap and water for 20 seconds. If soap and water are not available, use alcohol-based hand sanitizer.  Stay away from other members of your household. Let healthy household members care for children and pets, if possible. If you have to care for children or pets, wash your hands often and wear a mask. If possible, stay in your own room, separate from others. Use a different bathroom.  Make sure that all people in your household wash their hands well and often.  Cough or sneeze into a tissue or your sleeve or elbow. Do not cough or  sneeze into your hand or into the air.  Wear a cloth face covering or face mask. Where to find more information  Centers for Disease Control and Prevention: PurpleGadgets.be  World Health Organization: https://www.castaneda.info/ Contact a health care provider if:  You live in or have traveled to an area where COVID-19 is a risk and you have symptoms of the infection.  You have had contact with someone who has COVID-19 and you have symptoms of the infection. Get help right away if:  You have trouble breathing.  You have pain or pressure in your chest.  You have confusion.  You have bluish lips and fingernails.  You have difficulty waking from sleep.  You have symptoms that get worse. These symptoms may represent a serious problem that is an emergency. Do not wait to see if the symptoms will go away. Get medical help right away. Call your local emergency services (911 in the [place] Do not drive yourself to the hospital. Let the emergency medical personnel know if you think you have COVID-19. Summary  COVID-19 is a respiratory infection that is caused by a virus. It is also known as coronavirus disease or novel coronavirus. It can cause serious infections, such as pneumonia, acute respiratory distress syndrome, acute respiratory failure, or sepsis.  The virus that causes COVID-19 is contagious. This means that it can spread from person to person through droplets from coughs and sneezes.  You are more likely to develop a serious illness if you are 35 years of age or older, have a weak immunity, live in a nursing home, or have chronic disease.  There is no medicine to treat COVID-19. Your health care provider will talk with you about ways to treat your symptoms.  Take steps to protect yourself and others from infection. Wash your hands often and disinfect objects and surfaces that are frequently touched every day. Stay away from people who are  sick and wear a mask if you are sick. This information is not intended to replace advice given to you by your health care provider. Make sure you discuss any questions you have with your health care provider. Document Released: 11/01/2018 Document Revised: 02/21/2019 Document Reviewed: 11/01/2018 Elsevier Patient Education  2020 [employer].

## 2019-09-26 ENCOUNTER — Other Ambulatory Visit: Payer: Self-pay | Admitting: Nurse Practitioner

## 2019-09-26 DIAGNOSIS — D509 Iron deficiency anemia, unspecified: Secondary | ICD-10-CM

## 2019-09-30 ENCOUNTER — Other Ambulatory Visit: Payer: Self-pay | Admitting: Nurse Practitioner

## 2019-09-30 DIAGNOSIS — D509 Iron deficiency anemia, unspecified: Secondary | ICD-10-CM

## 2019-09-30 MED ORDER — FERRALET 90 90-1 MG PO TABS: 1.0000 | ORAL_TABLET | Freq: Every day | ORAL | 0 refills | Status: DC

## 2019-10-01 ENCOUNTER — Telehealth: Payer: Self-pay

## 2019-10-01 NOTE — Telephone Encounter (Signed)
I called patient to schedule her a office visit because she has not been seen since March and also to notify her that her [facility] was sent to the pharmacy. I left her a v/m to call the office [facility]

## 2020-01-15 ENCOUNTER — Other Ambulatory Visit: Payer: Self-pay

## 2020-01-15 MED ORDER — BUDESONIDE 32 MCG/ACT NA SUSP: 2.0000 | Freq: Every day | NASAL | 2 refills | Status: DC

## 2020-02-22 IMAGING — CT CT ANGIO CHEST
2 of 12 series · 12 of 46 positions shown · IV contrast (APPLIED)
Comparison: Chest radiograph performed 05/18/2018

CLINICAL DATA: Acute onset of hypoxic respiratory failure.

EXAM:
CT ANGIOGRAPHY CHEST WITH CONTRAST
TECHNIQUE: Multidetector CT imaging of the chest was performed using the
standard protocol during bolus administration of intravenous
contrast. Multiplanar CT image reconstructions and MIPs were
obtained to evaluate the vascular anatomy.
CONTRAST:  150mL [id number] IOPAMIDOL ([id number]) INJECTION 76%

[Series 7: thins · axial · 0.73mm/px · z∈[-966,-741]mm · 11 of 271 slices shown]
[im 23/271  lung]
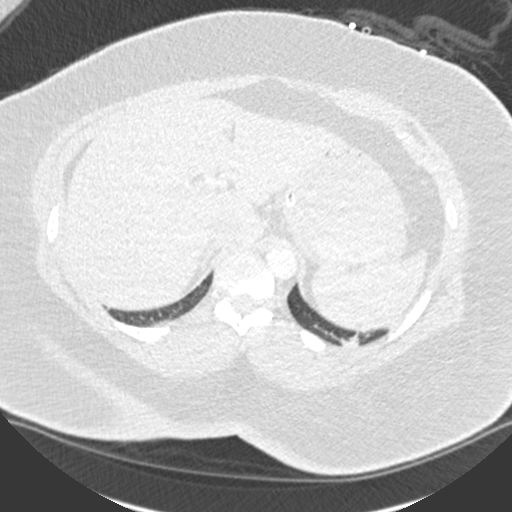
[im 46/271  soft-tissue]
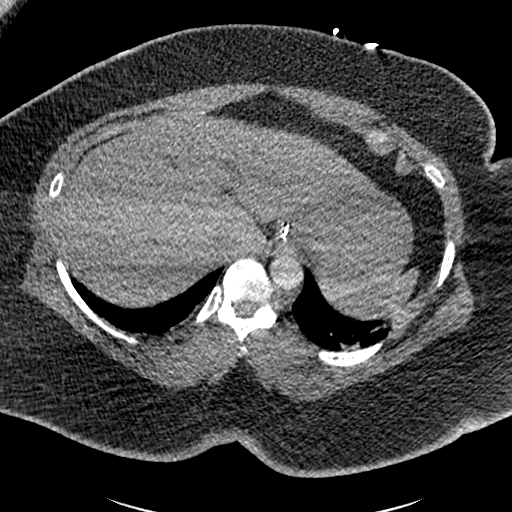
[im 68/271  lung]
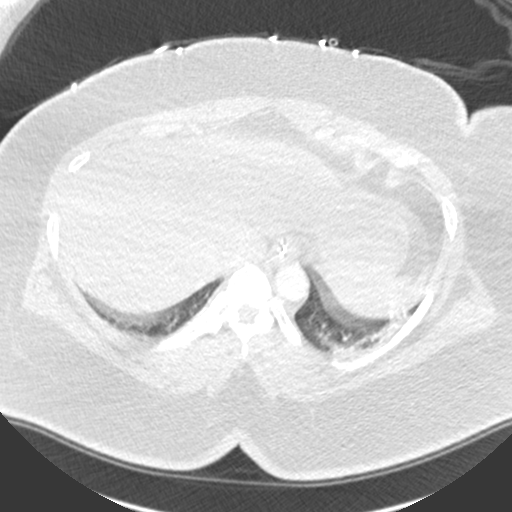
[im 91/271  soft-tissue]
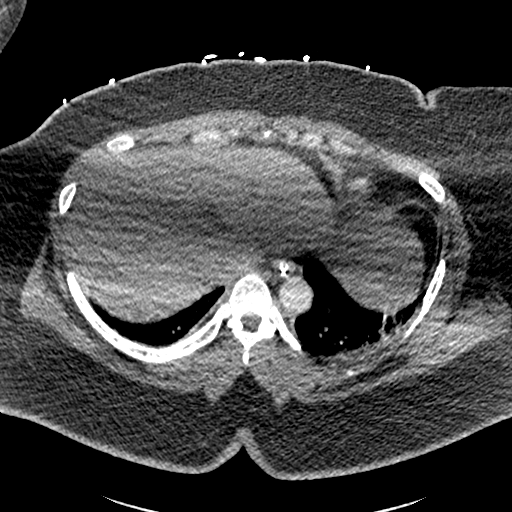
[im 113/271  lung]
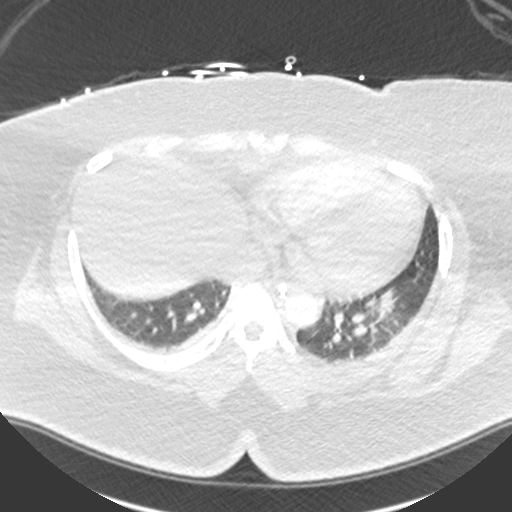
[im 136/271  soft-tissue]
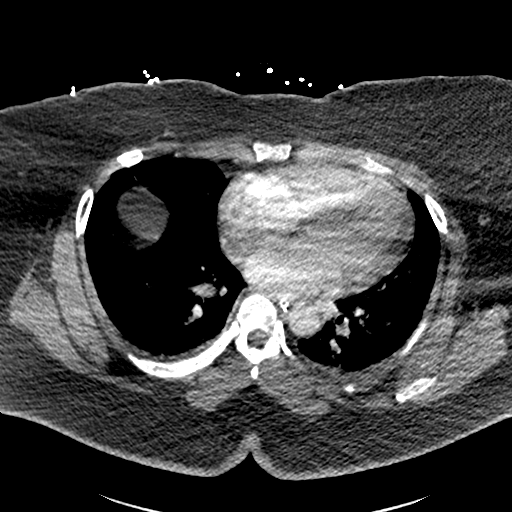
[im 158/271  lung]
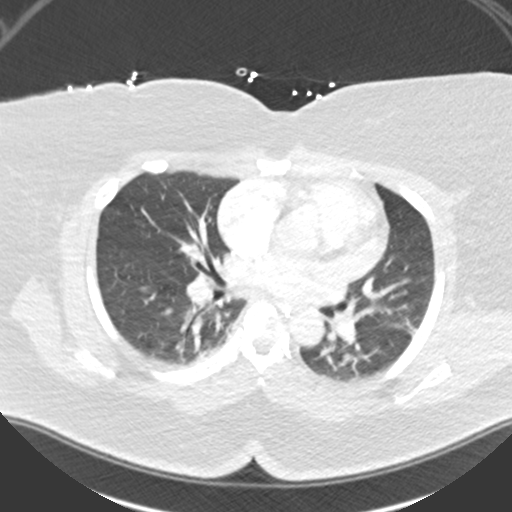
[im 181/271  soft-tissue]
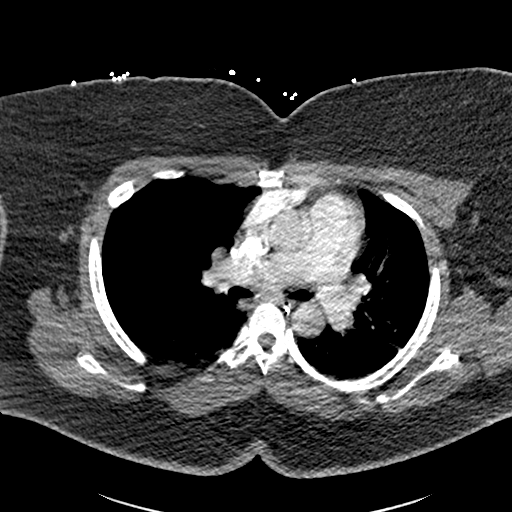
[im 203/271  lung]
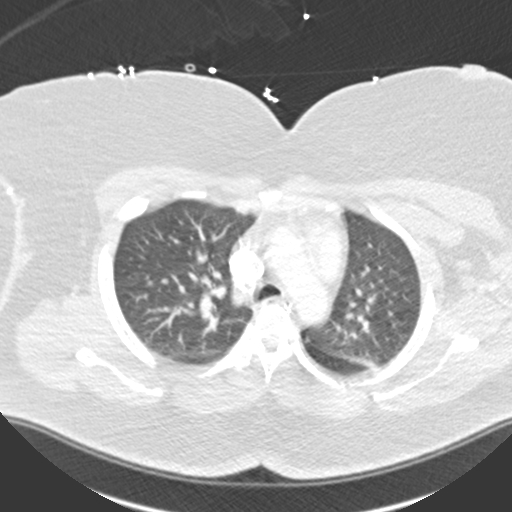
[im 226/271  soft-tissue]
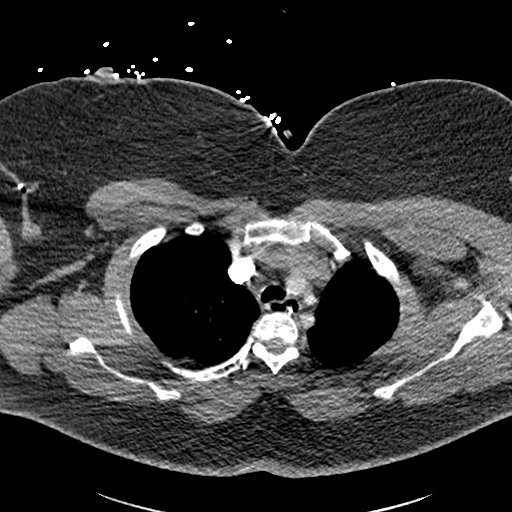
[im 248/271  lung]
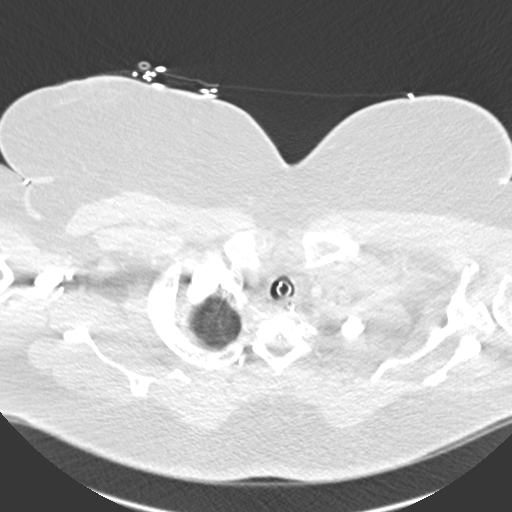

[Series 9: coronal mpr · coronal · 0.53mm/px · 1 of 101 slices shown]
[im 51/101  soft-tissue]
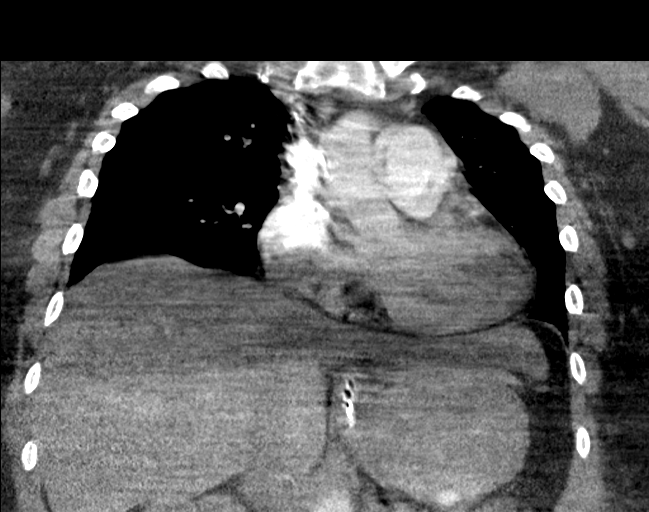

[12 of 46 positions shown; findings below may reference images not displayed]

FINDINGS: Cardiovascular: Evaluation is markedly suboptimal due to limitations
in the timing of the contrast bolus. No central pulmonary embolus is
seen. However, the study is essentially nondiagnostic for pulmonary
embolus.

The heart is borderline normal in size. The thoracic aorta is
grossly unremarkable. The great vessels are within normal limits.

Mediastinum/Nodes: The mediastinum is grossly unremarkable. No
mediastinal lymphadenopathy is seen. No pericardial effusion is
identified. The visualized portions of the thyroid gland are grossly
unremarkable. No axillary lymphadenopathy is seen.

The patient's endotracheal tube is seen ending 3 cm above the
carina. An enteric tube is noted extending into the stomach.

Lungs/Pleura: Mild left basilar airspace opacity likely reflects
atelectasis. Some of this is nodular in appearance. No pleural
effusion or pneumothorax is identified. No masses are identified.

Upper Abdomen: The visualized portions of the liver and spleen are
unremarkable. The visualized portions of the gallbladder, pancreas,
adrenal glands and kidneys are grossly unremarkable.

Musculoskeletal: No acute osseous abnormalities are identified. The
visualized musculature is unremarkable in appearance.

Review of the MIP images confirms the above findings.
IMPRESSION: 1. Evaluation is markedly suboptimal due to limitations in the
timing of the contrast bolus. No central pulmonary embolus seen.
However, the study is essentially nondiagnostic for pulmonary
embolus.
2. Mild left basilar airspace opacity likely reflects atelectasis.
Some of this is nodular in appearance. Lungs otherwise clear.

## 2020-02-23 IMAGING — DX DG CHEST 1V PORT
1 series · 1 of 1 positions shown · non-contrast
Comparison: May 20, 2018

CLINICAL DATA: ETT dislodged.  Re-intubation.

EXAM:
PORTABLE CHEST 1 VIEW

[chest ap]
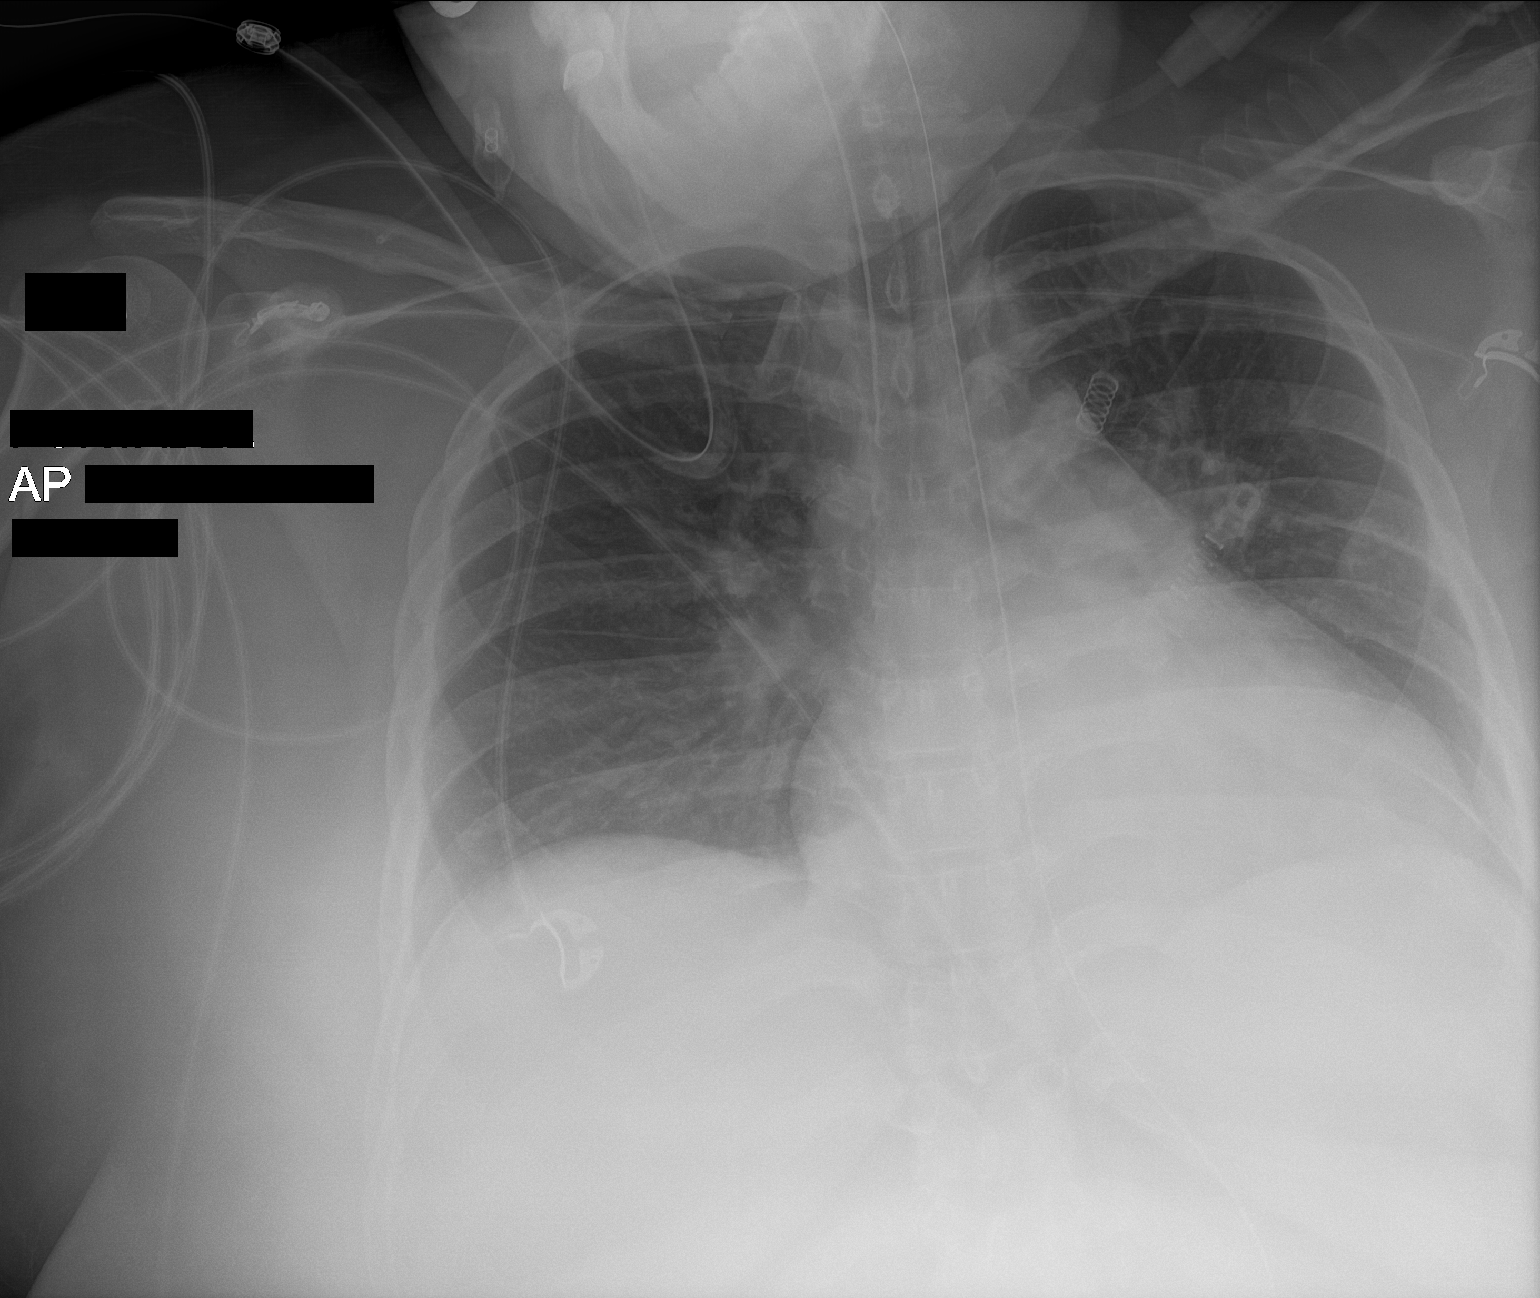

[1 of 1 positions shown; findings below may reference images not displayed]

FINDINGS: The ETT terminates 2.5 cm above the carina in good position. The NG
tube terminates below today's film. No pneumothorax. The lungs are
clear. No other acute abnormalities. Stable cardiomegaly.
IMPRESSION: The ETT is in good position.  No other interval change.

## 2020-02-23 IMAGING — DX DG ABD PORTABLE 1V
1 series · 1 of 1 positions shown · non-contrast
Comparison: 05/17/2018

CLINICAL DATA: Orogastric tube placement.

EXAM:
PORTABLE ABDOMEN - 1 VIEW

[abdomen supine]
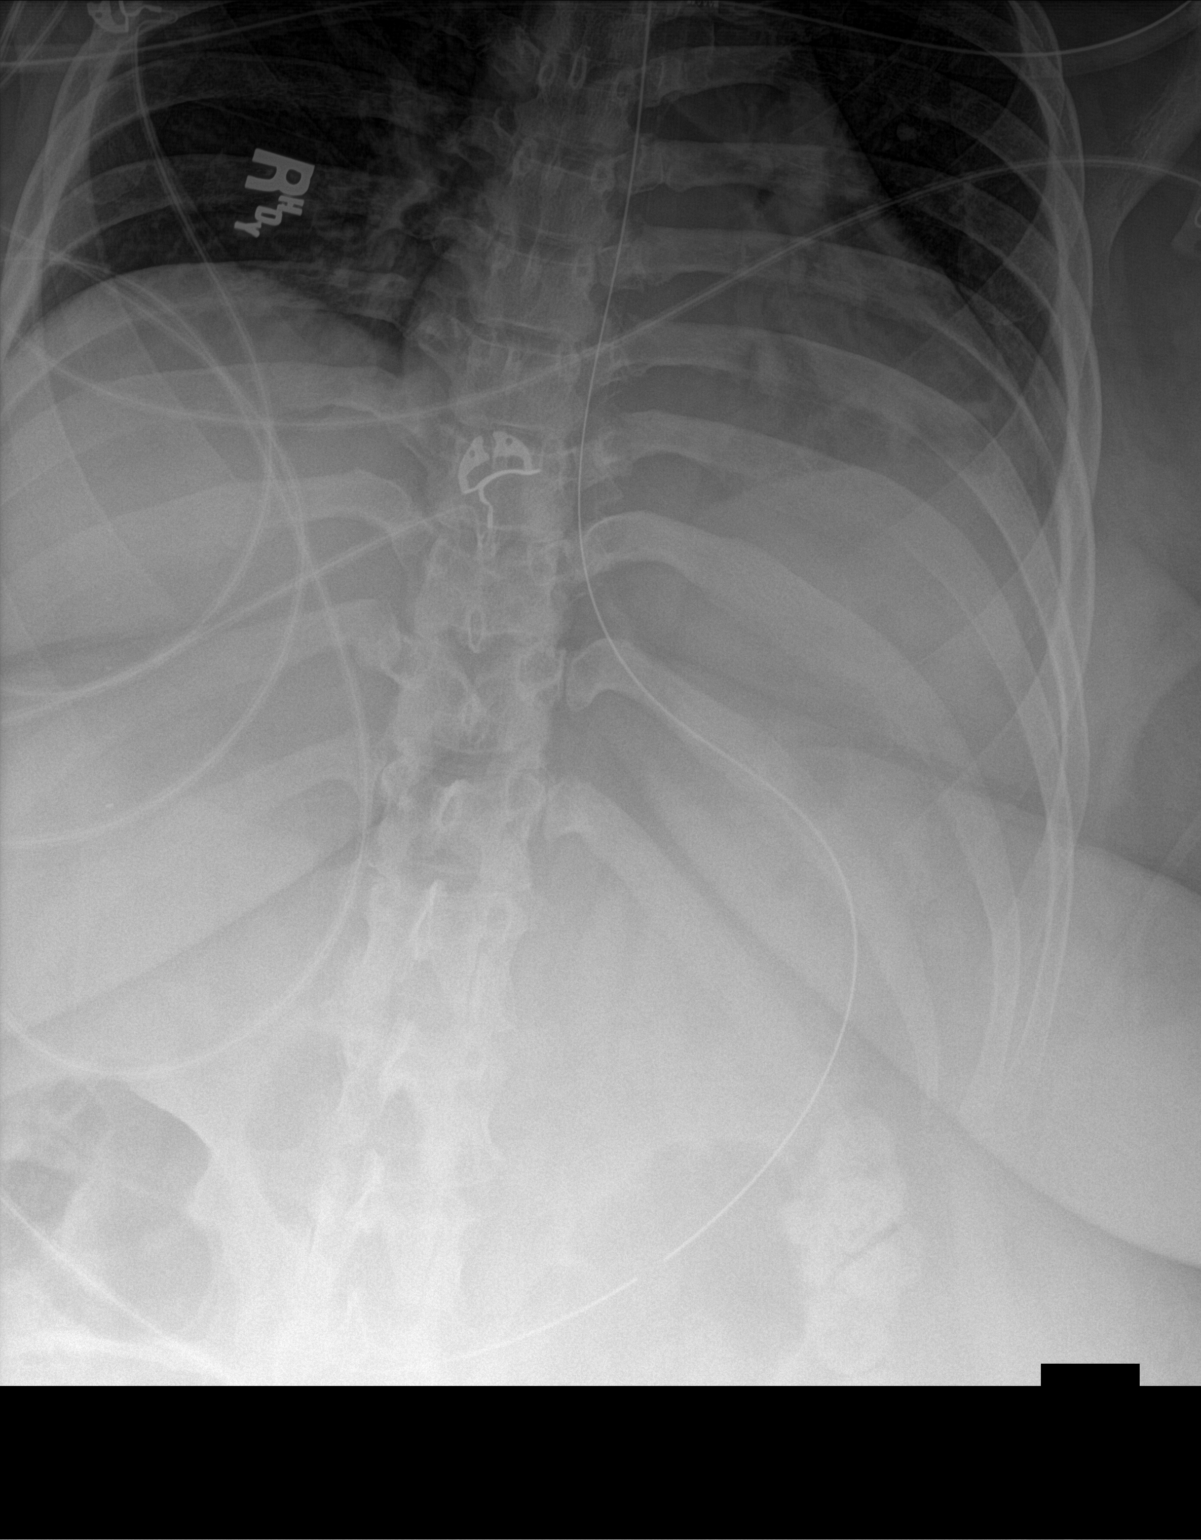

[1 of 1 positions shown; findings below may reference images not displayed]

FINDINGS: Orogastric tube tip extends well into the distal stomach based on
radiographic course. Visualized bowel gas pattern is unremarkable.
IMPRESSION: Orogastric tube tip extends into the distal stomach.

## 2020-02-23 IMAGING — DX DG ABDOMEN 1V
1 series · 1 of 1 positions shown · non-contrast
Comparison: May 20, 2018

CLINICAL DATA: Evaluate OG tube

EXAM:
ABDOMEN - 1 VIEW

[abdomen supine]
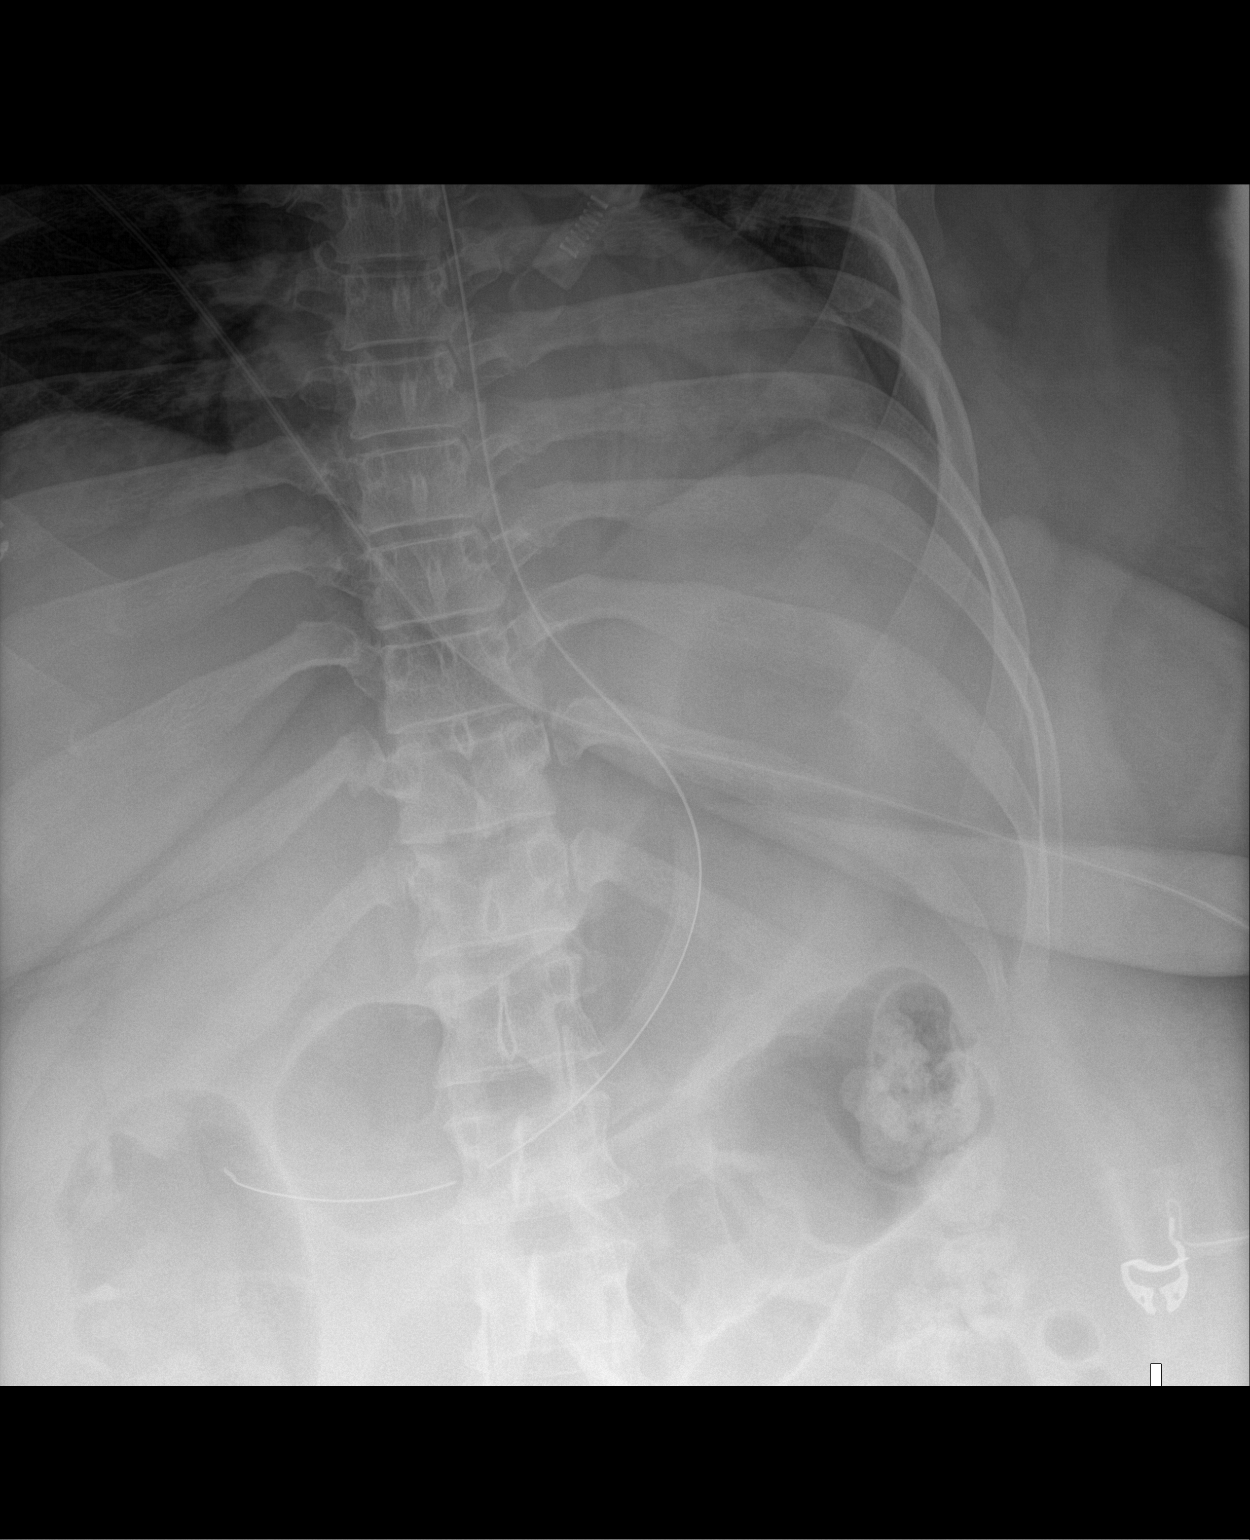

[1 of 1 positions shown; findings below may reference images not displayed]

FINDINGS: The distal tip of the OG tube is in the right mid abdomen, likely in
the distal stomach.
IMPRESSION: The distal tip of the OG tube is in the right mid abdomen, likely in
the distal stomach. The proximal duodenum is a possibility as well.

## 2020-06-25 LAB — CBC AND DIFFERENTIAL
HCT: 38 (ref 36–46)
Hemoglobin: 12.2 (ref 12.0–16.0)
Platelets: 424 — AB (ref 150–399)
WBC: 5.2

## 2020-06-25 LAB — POCT ERYTHROCYTE SEDIMENTATION RATE, NON-AUTOMATED: Sed Rate: 23

## 2020-06-25 LAB — HEPATIC FUNCTION PANEL
ALT: 12 (ref 7–35)
AST: 19 (ref 13–35)
Alkaline Phosphatase: 69 (ref 25–125)
Bilirubin, Total: 0.4

## 2020-06-25 LAB — BASIC METABOLIC PANEL
BUN: 5 (ref 4–21)
Chloride: 105 (ref 99–108)
Creatinine: 0.9 (ref 0.5–1.1)
Glucose: 93
Potassium: 5.3 (ref 3.4–5.3)
Sodium: 141 (ref 137–147)

## 2020-06-25 LAB — COMPREHENSIVE METABOLIC PANEL
Albumin: 4.5 (ref 3.5–5.0)
Calcium: 9.7 (ref 8.7–10.7)
GFR calc Af Amer: 86
GFR calc non Af Amer: 755
Globulin: 2.5

## 2020-06-25 LAB — CBC: RBC: 4.37 (ref 3.87–5.11)

## 2020-07-02 ENCOUNTER — Telehealth: Payer: Self-pay | Admitting: Internal Medicine

## 2020-07-02 DIAGNOSIS — J4550 Severe persistent asthma, uncomplicated: Secondary | ICD-10-CM

## 2020-07-02 MED ORDER — BUDESONIDE-FORMOTEROL FUMARATE 80-4.5 MCG/ACT IN AERO: 2.0000 | INHALATION_SPRAY | Freq: Two times a day (BID) | RESPIRATORY_TRACT | 1 refills | Status: DC

## 2020-07-02 MED ORDER — BUDESONIDE-FORMOTEROL FUMARATE 160-4.5 MCG/ACT IN AERO: 2.0000 | INHALATION_SPRAY | Freq: Two times a day (BID) | RESPIRATORY_TRACT | 1 refills | Status: DC

## 2020-07-02 MED ORDER — ALBUTEROL SULFATE HFA 108 (90 BASE) MCG/ACT IN AERS: 2.0000 | INHALATION_SPRAY | Freq: Four times a day (QID) | RESPIRATORY_TRACT | 1 refills | Status: DC | PRN

## 2020-07-02 NOTE — Telephone Encounter (Signed)
Called and spoke patient, who is requesting Rx for symbicort 160, symbicort 80 and ventolin.  Rx has been sent to preferred pharmacy.  Per last OV note. Patient alternates symbicort 160 and symbicort 80 as needed.  OV scheduled for 08/14/2020 at 9:30. Nothing further needed.

## 2020-07-08 ENCOUNTER — Encounter: Payer: Self-pay | Admitting: Nurse Practitioner

## 2020-08-14 ENCOUNTER — Other Ambulatory Visit: Payer: Self-pay

## 2020-08-14 ENCOUNTER — Encounter: Payer: Self-pay | Admitting: Internal Medicine

## 2020-08-14 ENCOUNTER — Ambulatory Visit: Payer: Managed Care, Other (non HMO) | Admitting: Internal Medicine

## 2020-08-14 VITALS — BP 122/72 | HR 72 | Temp 97.3°F | Ht 66.0 in | Wt 302.4 lb

## 2020-08-14 DIAGNOSIS — J4540 Moderate persistent asthma, uncomplicated: Secondary | ICD-10-CM | POA: Diagnosis not present

## 2020-08-14 MED ORDER — PREDNISONE 20 MG PO TABS: 20.0000 mg | ORAL_TABLET | Freq: Every day | ORAL | 1 refills | Status: DC

## 2020-08-14 MED ORDER — BUDESONIDE-FORMOTEROL FUMARATE 80-4.5 MCG/ACT IN AERO: 2.0000 | INHALATION_SPRAY | Freq: Two times a day (BID) | RESPIRATORY_TRACT | 10 refills | Status: DC

## 2020-08-14 MED ORDER — BUDESONIDE-FORMOTEROL FUMARATE 160-4.5 MCG/ACT IN AERO: 2.0000 | INHALATION_SPRAY | Freq: Two times a day (BID) | RESPIRATORY_TRACT | 10 refills | Status: DC

## 2020-08-14 MED ORDER — ALBUTEROL SULFATE HFA 108 (90 BASE) MCG/ACT IN AERS: 2.0000 | INHALATION_SPRAY | Freq: Four times a day (QID) | RESPIRATORY_TRACT | 10 refills | Status: DC | PRN

## 2020-08-14 NOTE — Progress Notes (Signed)
PULMONARY OFFICE FOLLOW UP NOTE    Primary MD: Glendale Chard  Date of initial office visit: 06/15/18  Reason for consultation: Post hospitalization after intubation for status asthmaticus  PT PROFILE: 44 y.o. female never smoker admitted to [facility] 8/8-8/14/19 with status asthmaticus. Intubated in ED and briefly required epinephrine an ketamine infusions. Self extubated on 8/11.   DATA: 05/19/18 CTA chest: no acute findings except mild dependent atelectasis 08/07/2018 PFTs: FVC: 2.92 > 2.98 L (91 > 93 %pred), FEV1: 2.30 > 2.47 L (88 > 95% pred), FEV1/FVC: 79%, TLC: 4.5 to L (86 %pred), DLCO 78 %pred 08/2018 IgE levels 135, EOS 400   10/2018 HST AHI: 78.5 / h  INTERVAL: Last visit 08/16/2018.  In interim, diagnosed with OSA [facility] neurology).  No major pulmonary events  SYNOPSIS  She remains on Symbicort for asthma.  She also is on montelukast and levocetirizine.  She rarely uses albuterol.  She continues to have problems with hoarseness which "comes and goes".  She cannot identify any factors that exacerbate her hoarseness.  She denies CP, fever, purulent sputum, hemoptysis, LE edema and calf tenderness. Persistent, intermittent hoarseness raises concern for possibility of vocal cord dysfunction syndrome contributing to her "asthma" symptoms.  VCDS can be exacerbated by inhaled corticosteroids  CC Follow-up asthma Follow up OSA   HPI Patient has a diagnosis of asthma Previous ventilatory support in August 2019 Has been on inhalers with Symbicort albuterol as needed Last office visit she had [place] asthma exacerbation Patient was given antibiotics and prednisone  Diagnosis of severe Sleep Apnea AHI 78 Compliance report not available but has refreshed sleep More energy Patients life has changed  +exposure to carts, chest heaviness and wheezing Advised to avoid cats   Review of Systems:  Gen:  Denies  fever, sweats, chills weight loss  HEENT: Denies blurred vision, double  vision, ear pain, eye pain, hearing loss, nose bleeds, sore throat Cardiac:  No dizziness, chest pain or heaviness, chest tightness,edema, No JVD Resp:   No cough, -sputum production, -shortness of breath,-wheezing, -hemoptysis,  Gi: Denies swallowing difficulty, stomach pain, nausea or vomiting, diarrhea, constipation, bowel incontinence Gu:  Denies bladder incontinence, burning urine Ext:   Denies Joint pain, stiffness or swelling Skin: Denies  skin rash, easy bruising or bleeding or hives Endoc:  Denies polyuria, polydipsia , polyphagia or weight change Psych:   Denies depression, insomnia or hallucinations  Other:  All other systems negative  BP 122/72 (BP Location: Left Arm, Patient Position: Sitting, Cuff Size: Normal)   Pulse 72   Temp (!) 97.3 F (36.3 C) (Temporal)   Ht 5\' 6"  (1.676 m)   Wt (!) 302 lb 6.4 oz (137.2 kg)   SpO2 100%   BMI 48.81 kg/m    Physical Examination:   General Appearance: No distress  Neuro:without focal findings,  speech normal,  HEENT: PERRLA, EOM intact.   Pulmonary: normal breath sounds, No wheezing.  CardiovascularNormal S1,S2.  No m/r/g.   Abdomen: Benign, Soft, non-tender. Renal:  No costovertebral tenderness  GU:  Not performed at this time. Endoc: No evident thyromegaly Skin:   warm, no rashes, no ecchymosis  Extremities: normal, no cyanosis, clubbing. PSYCHIATRIC: Mood, affect within normal limits.   ALL OTHER ROS ARE NEGATIVE    DATA:   BMP Latest Ref Rng & Units 06/25/2020 12/10/2018 10/18/2018  Glucose 70 - 99 mg/dL - 106(H) 83  BUN 4 - 21 5 7 8   Creatinine 0.5 - 1.1 0.9 0.68 0.73  BUN/Creat Ratio  9 - 23 - - 11  Sodium 137 - 147 141 138 140  Potassium 3.4 - 5.3 5.3 3.7 4.9  Chloride 99 - 108 105 107 106  CO2 22 - 32 mmol/L - 22 21  Calcium 8.7 - 10.7 9.7 9.3 9.9    CBC Latest Ref Rng & Units 06/25/2020 12/10/2018 10/18/2018  WBC - 5.2 8.6 6.2  Hemoglobin 12.0 - 16.0 12.2 8.0(L) 8.2(L)  Hematocrit 36 - 46 38 28.3(L) 28.0(L)   Platelets 150 - 399 424(A) 636(H) 615(H)     IMPRESSION:   Moderate persistent asthma Continue inhalers as prescribed Continue Symbicort as prescribed Continue albuterol as needed Avoid allergens Patient is allergic to cats patient advised to avoid cats   Severe OSA Continue CPAP as prescribed   COVID-19 EDUCATION: The signs and symptoms of COVID-19 were discussed with the patient and how to seek care for testing.  The importance of social distancing was discussed today. Hand Washing Techniques and avoid touching face was advised.     MEDICATION ADJUSTMENTS/LABS AND TESTS ORDERED: Continue CPAP Continue Symbicort as prescribed Avoid Triggers and cats Prednisone 20 mg daily for 10 days   CURRENT MEDICATIONS REVIEWED AT LENGTH WITH PATIENT TODAY   Patient satisfied with Plan of action and management. All questions answered  Follow up in 6 months   Hadrian Yarbrough Patricia Pesa, M.D.  [facility]  Medical Director [place] Director [facility] Cardio-Pulmonary Department

## 2020-08-14 NOTE — Patient Instructions (Addendum)
Continue CPAP Continue Symbicort as prescribed Avoid Triggers and cats Prednisone 20 mg daily for 10 days

## 2020-12-07 ENCOUNTER — Encounter: Payer: Self-pay | Admitting: Nurse Practitioner

## 2020-12-08 ENCOUNTER — Encounter: Payer: Self-pay | Admitting: Nurse Practitioner

## 2020-12-16 ENCOUNTER — Encounter: Payer: Self-pay | Admitting: Nurse Practitioner

## 2020-12-16 ENCOUNTER — Other Ambulatory Visit: Payer: Self-pay

## 2020-12-16 ENCOUNTER — Ambulatory Visit (INDEPENDENT_AMBULATORY_CARE_PROVIDER_SITE_OTHER): Payer: 59 | Admitting: Nurse Practitioner

## 2020-12-16 VITALS — BP 120/80 | HR 76 | Temp 98.0°F | Ht 65.2 in | Wt 307.0 lb

## 2020-12-16 DIAGNOSIS — K219 Gastro-esophageal reflux disease without esophagitis: Secondary | ICD-10-CM

## 2020-12-16 DIAGNOSIS — R2231 Localized swelling, mass and lump, right upper limb: Secondary | ICD-10-CM

## 2020-12-16 DIAGNOSIS — R7309 Other abnormal glucose: Secondary | ICD-10-CM | POA: Diagnosis not present

## 2020-12-16 DIAGNOSIS — Z1211 Encounter for screening for malignant neoplasm of colon: Secondary | ICD-10-CM

## 2020-12-16 DIAGNOSIS — Z1159 Encounter for screening for other viral diseases: Secondary | ICD-10-CM

## 2020-12-16 DIAGNOSIS — D509 Iron deficiency anemia, unspecified: Secondary | ICD-10-CM | POA: Diagnosis not present

## 2020-12-16 DIAGNOSIS — F419 Anxiety disorder, unspecified: Secondary | ICD-10-CM

## 2020-12-16 DIAGNOSIS — J45909 Unspecified asthma, uncomplicated: Secondary | ICD-10-CM

## 2020-12-16 MED ORDER — EPINEPHRINE 0.3 MG/0.3ML IJ SOAJ: 0.3000 mg | INTRAMUSCULAR | 5 refills | Status: DC | PRN

## 2020-12-16 MED ORDER — PANTOPRAZOLE SODIUM 40 MG PO TBEC: 40.0000 mg | DELAYED_RELEASE_TABLET | Freq: Two times a day (BID) | ORAL | 1 refills | Status: DC

## 2020-12-16 MED ORDER — FERRALET 90 90-1 MG PO TABS: 1.0000 | ORAL_TABLET | Freq: Every day | ORAL | 5 refills | Status: DC

## 2020-12-16 MED ORDER — BENZONATATE 100 MG PO CAPS: 100.0000 mg | ORAL_CAPSULE | Freq: Four times a day (QID) | ORAL | 1 refills | Status: AC | PRN

## 2020-12-16 MED ORDER — MONTELUKAST SODIUM 10 MG PO TABS: 10.0000 mg | ORAL_TABLET | Freq: Every day | ORAL | 1 refills | Status: DC

## 2020-12-16 NOTE — Progress Notes (Signed)
I,Yamilka Roman [employer] as a Education administrator for [employer], FNP.,have documented all relevant documentation on the behalf of Jasmine Brine, FNP,as directed by  Jasmine Brine, FNP while in the presence of Jasmine Schultz, [place]. This visit occurred during the SARS-CoV-2 public health emergency.  Safety protocols were in place, including screening questions prior to the visit, additional usage of staff PPE, and extensive cleaning of exam room while observing appropriate contact time as indicated for disinfecting solutions.  Subjective:     Patient ID: Jasmine Schultz , female    DOB: 05-Jul-1976 , 45 y.o.   MRN: [id number]   Chief Complaint  Patient presents with  . abnormal glucose  . iron f/u  . Anxiety    HPI  She switched jobs and when Covid came she lost her job due to not wanting to be around too many people.  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  She has not had the covid vaccine - her pulmonologist recommends she does not take the covid vaccine due to the medications she is on and her autoimmune.   Wt Readings from Last 3 Encounters: 12/16/20 : (!) 307 lb (139.3 kg) 08/14/20 : (!) 302 lb 6.4 oz (137.2 kg) 12/19/18 : 289 lb 3.2 oz (131.2 kg)  She has a GYN at [facility], has not had a PAP.   She was seen by Dr Dossie Der in September.      Past Medical History:  Diagnosis Date  . Anxiety attack   . Arthritis   . Asthma    diagnosed in march 2019  . Bursitis   . Chronically dry eyes   . GERD (gastroesophageal reflux disease)   . IBS (irritable bowel syndrome)   . Migraine   . Multiple allergies   . Sun allergy   . Tendonitis      Family History  Problem Relation Age of Onset  . Asthma Mother   . Hypertension Mother   . CAD Other   . CVA Other   . Aneurysm Other        brain aneurysm multiple family members  . Breast cancer Other        grandmother  . Sickle cell anemia Other        maternal cousin  . Aneurysm Maternal Aunt   . Diabetes Maternal Aunt   .  Diabetes Maternal Uncle   . Diabetes Paternal Aunt   . Heart disease Paternal Uncle   . Diabetes Paternal Uncle   . Breast cancer Maternal Grandmother   . Diabetes Maternal Grandmother   . Heart disease Maternal Grandfather   . Aneurysm Maternal Grandfather   . Diabetes Maternal Grandfather   . Diabetes Paternal Grandmother   . Heart disease Paternal Grandfather   . Diabetes Paternal Grandfather      Current Outpatient Medications:  .  ALPRAZolam (XANAX) 0.25 MG tablet, Take 1 tablet (0.25 mg total) by mouth 3 (three) times daily as needed., Disp: 30 tablet, Rfl: 2 .  benzonatate (TESSALON PERLES) 100 MG capsule, Take 1 capsule (100 mg total) by mouth every 6 (six) hours as needed., Disp: 30 capsule, Rfl: 1 .  budesonide-formoterol (SYMBICORT) 80-4.5 MCG/ACT inhaler, Inhale 2 puffs into the lungs in the morning and at bedtime., Disp: 1 each, Rfl: 10 .  busPIRone (BUSPAR) 5 MG tablet, Take 1 tablet (5 mg total) by mouth daily., Disp: 90 tablet, Rfl: 1 .  Cholecalciferol (VITAMIN D-3) 1000 units CAPS, Take 6,000 Units by mouth daily. ,  Disp: , Rfl:  .  Cholecalciferol (VITAMIN D-3) 25 MCG (1000 UT) CAPS, Take 1 capsule by mouth daily at 6 (six) AM., Disp: , Rfl:  .  clobetasol cream (TEMOVATE) 3.14 %, Apply 1 application topically daily as needed. , Disp: , Rfl: 3 .  cyanocobalamin 2000 MCG tablet, Take 2,000 mcg by mouth daily., Disp: , Rfl:  .  EPINEPHrine 0.3 mg/0.3 mL IJ SOAJ injection, Inject 0.3 mg into the muscle as needed for anaphylaxis., Disp: 1 each, Rfl: 5 .  Fe Cbn-Fe Gluc-FA-B12-C-DSS (FERRALET 90) 90-1 MG TABS, Take 1 tablet by mouth daily., Disp: 30 tablet, Rfl: 5 .  Fluocin-Hydroquinone-Tretinoin (TRI-LUMA EX), Apply 1 application topically at bedtime. , Disp: , Rfl:  .  fluticasone (CUTIVATE) 0.05 % cream, Apply 1 application topically daily as needed (unknown indication). , Disp: , Rfl:  .  hydroxychloroquine (PLAQUENIL) 200 MG tablet, Take 200 mg by mouth daily., Disp: ,  Rfl:  .  levocetirizine (XYZAL) 5 MG tablet, Take 1 tablet (5 mg total) by mouth every evening., Disp: 30 tablet, Rfl: 10 .  Multiple Vitamin (MULTIVITAMIN ADULT PO), Take 1 tablet by mouth daily at 6 (six) AM. Vita fusion, Disp: , Rfl:  .  ondansetron (ZOFRAN ODT) 4 MG disintegrating tablet, Take 1 tablet (4 mg total) by mouth every 8 (eight) hours as needed., Disp: 20 tablet, Rfl: 0 .  Polyethyl Glycol-Propyl Glycol (SYSTANE OP), Apply 2 drops to eye daily., Disp: , Rfl:  .  albuterol (VENTOLIN HFA) 108 (90 Base) MCG/ACT inhaler, Inhale 2 puffs into the lungs every 6 (six) hours as needed for wheezing or shortness of breath., Disp: 18 g, Rfl: 10 .  montelukast (SINGULAIR) 10 MG tablet, Take 1 tablet (10 mg total) by mouth at bedtime., Disp: 90 tablet, Rfl: 1 .  nystatin (MYCOSTATIN/NYSTOP) 100000 UNIT/GM POWD, Apply 1 g topically 2 (two) times daily.  (Patient not taking: No sig reported), Disp: , Rfl: 1 .  pantoprazole (PROTONIX) 40 MG tablet, Take 1 tablet (40 mg total) by mouth 2 (two) times daily., Disp: 180 tablet, Rfl: 1   Allergies  Allergen Reactions  . Tomato Anaphylaxis and Rash  . Bacitracin-Polymyxin B     Band aids cause- dark spots on her skin and itching  . Chocolate   . Cocoa   . Oysters [Shellfish Allergy] Hives  . Peanut Butter Flavor     Feels congested  . Sulfa Antibiotics Hives     Review of Systems  Constitutional: Negative for fatigue and fever.  HENT: Negative.   Eyes: Negative for photophobia, pain, discharge, redness, itching and visual disturbance.  Respiratory: Negative.  Negative for cough.   Cardiovascular: Negative.  Negative for chest pain, palpitations and leg swelling.  Skin:       She has to areas of lumps one on right wrist and left anterior foot.   Neurological: Negative for dizziness and headaches.     Today's Vitals   12/16/20 1057  BP: 120/80  Pulse: 76  Temp: 98 F (36.7 C)  TempSrc: Oral  Weight: (!) 307 lb (139.3 kg)  Height: 5'  5.2" (1.656 m)  PainSc: 0-No pain   Body mass index is 50.77 kg/m.   Objective:  Physical Exam Vitals reviewed.  Constitutional:      General: She is not in acute distress.    Appearance: Normal appearance.  Cardiovascular:     Rate and Rhythm: Normal rate and regular rhythm.     Pulses: Normal pulses.  Heart sounds: Normal heart sounds. No murmur heard.   Pulmonary:     Effort: Pulmonary effort is normal. No respiratory distress.     Breath sounds: Normal breath sounds. No wheezing.  Skin:    General: Skin is warm and dry.     Comments: Right wrist has a firm nodule moveable nodule present, nontender.   Neurological:     General: No focal deficit present.     Mental Status: She is alert and oriented to person, place, and time.     Cranial Nerves: No cranial nerve deficit.  Psychiatric:        Mood and Affect: Mood normal.        Behavior: Behavior normal.        Thought Content: Thought content normal.        Judgment: Judgment normal.         Assessment And Plan:     1. Iron deficiency anemia, unspecified iron deficiency anemia type  Chronic, she has been without her ferralet  Will check levels have not been checked since prior to Covid - Fe Cbn-Fe Gluc-FA-B12-C-DSS (FERRALET 90) 90-1 MG TABS; Take 1 tablet by mouth daily.  Dispense: 30 tablet; Refill: 5 - Iron, TIBC and Ferritin Panel - CBC - CMP14+EGFR  2. Abnormal glucose  Chronic,  She is encouraged to avoid sugary foods and drinks.  She is also to encouraged to increase physical activity to at least 150 minutes per week  3. Anxiety  Chronic, stable  No current medications  4. Chronic GERD - pantoprazole (PROTONIX) 40 MG tablet; Take 1 tablet (40 mg total) by mouth 2 (two) times daily.  Dispense: 180 tablet; Refill: 1  5. Mild asthma without complication, unspecified whether persistent  No recent exacerbations  She is back working in an environment where there is heavy smoke -  EPINEPHrine 0.3 mg/0.3 mL IJ SOAJ injection; Inject 0.3 mg into the muscle as needed for anaphylaxis.  Dispense: 1 each; Refill: 5 - montelukast (SINGULAIR) 10 MG tablet; Take 1 tablet (10 mg total) by mouth at bedtime.  Dispense: 90 tablet; Refill: 1 - benzonatate (TESSALON PERLES) 100 MG capsule; Take 1 capsule (100 mg total) by mouth every 6 (six) hours as needed.  Dispense: 30 capsule; Refill: 1  6. Encounter for hepatitis C screening test for low risk patient  Will check Hepatitis C screening due to recent recommendations to screen all adults 18 years and older - Hepatitis C antibody  7. Encounter for screening colonoscopy  According to USPTF Colorectal cancer Screening guidelines. Colonoscopy is recommended every 10 years, starting at age 13years.  Will refer to GI for colon cancer screening. - Ambulatory referral to Gastroenterology  8. Lump of right wrist  Firm non tender mobile lump to wrist posterior  Will refer to dermatology for further evaluation - Ambulatory referral to Dermatology     Patient was given opportunity to ask questions. Patient verbalized understanding of the plan and was able to repeat key elements of the plan. All questions were answered to their satisfaction.  Jasmine Brine, FNP   I, Jasmine Brine, FNP, have reviewed all documentation for this visit. The documentation on 12/16/20 for the exam, diagnosis, procedures, and orders are all accurate and complete.   THE PATIENT IS ENCOURAGED TO PRACTICE SOCIAL DISTANCING DUE TO THE COVID-19 PANDEMIC.

## 2020-12-17 LAB — CMP14+EGFR
ALT: 15 IU/L (ref 0–32)
AST: 19 IU/L (ref 0–40)
Albumin/Globulin Ratio: 1.6 (ref 1.2–2.2)
Albumin: 4.2 g/dL (ref 3.8–4.8)
Alkaline Phosphatase: 64 IU/L (ref 44–121)
BUN/Creatinine Ratio: 10 (ref 9–23)
BUN: 7 mg/dL (ref 6–24)
Bilirubin Total: 0.3 mg/dL (ref 0.0–1.2)
CO2: 22 mmol/L (ref 20–29)
Calcium: 9.4 mg/dL (ref 8.7–10.2)
Chloride: 104 mmol/L (ref 96–106)
Creatinine, Ser: 0.73 mg/dL (ref 0.57–1.00)
Globulin, Total: 2.6 g/dL (ref 1.5–4.5)
Glucose: 82 mg/dL (ref 65–99)
Potassium: 4.4 mmol/L (ref 3.5–5.2)
Sodium: 139 mmol/L (ref 134–144)
Total Protein: 6.8 g/dL (ref 6.0–8.5)
eGFR: 103 mL/min/{1.73_m2} (ref >59)

## 2020-12-17 LAB — CBC
Hematocrit: 36.8 % (ref 34.0–46.6)
Hemoglobin: 11.9 g/dL (ref 11.1–15.9)
MCH: 28.1 pg (ref 26.6–33.0)
MCHC: 32.3 g/dL (ref 31.5–35.7)
MCV: 87 fL (ref 79–97)
Platelets: 450 10*3/uL (ref 150–450)
RBC: 4.24 x10E6/uL (ref 3.77–5.28)
RDW: 12.6 % (ref 11.7–15.4)
WBC: 4.2 10*3/uL (ref 3.4–10.8)

## 2020-12-17 LAB — IRON,TIBC AND FERRITIN PANEL
Ferritin: 43 ng/mL (ref 15–150)
Iron Saturation: 11 % — ABNORMAL LOW (ref 15–55)
Iron: 34 ug/dL (ref 27–159)
Total Iron Binding Capacity: 297 ug/dL (ref 250–450)
UIBC: 263 ug/dL (ref 131–425)

## 2020-12-17 LAB — HEPATITIS C ANTIBODY: Hep C Virus Ab: <0.1 s/co ratio (ref 0.0–0.9)

## 2020-12-21 LAB — HM DIABETES EYE EXAM

## 2021-06-23 ENCOUNTER — Encounter: Payer: Self-pay | Admitting: Nurse Practitioner

## 2021-06-23 ENCOUNTER — Other Ambulatory Visit: Payer: Self-pay

## 2021-06-23 ENCOUNTER — Ambulatory Visit (INDEPENDENT_AMBULATORY_CARE_PROVIDER_SITE_OTHER): Payer: Managed Care, Other (non HMO) | Admitting: Nurse Practitioner

## 2021-06-23 VITALS — BP 128/70 | HR 79 | Temp 97.9°F | Ht 65.2 in | Wt 314.0 lb

## 2021-06-23 DIAGNOSIS — D509 Iron deficiency anemia, unspecified: Secondary | ICD-10-CM | POA: Diagnosis not present

## 2021-06-23 DIAGNOSIS — R635 Abnormal weight gain: Secondary | ICD-10-CM

## 2021-06-23 DIAGNOSIS — Z0000 Encounter for general adult medical examination without abnormal findings: Secondary | ICD-10-CM | POA: Diagnosis not present

## 2021-06-23 DIAGNOSIS — Z23 Encounter for immunization: Secondary | ICD-10-CM | POA: Diagnosis not present

## 2021-06-23 DIAGNOSIS — R7309 Other abnormal glucose: Secondary | ICD-10-CM

## 2021-06-23 DIAGNOSIS — F419 Anxiety disorder, unspecified: Secondary | ICD-10-CM

## 2021-06-23 DIAGNOSIS — Z6841 Body Mass Index (BMI) 40.0 and over, adult: Secondary | ICD-10-CM

## 2021-06-23 NOTE — Patient Instructions (Signed)
Health Maintenance, Female Adopting a healthy lifestyle and getting preventive care are important in promoting health and wellness. Ask your health care provider about: The right schedule for you to have regular tests and exams. Things you can do on your own to prevent diseases and keep yourself healthy. What should I know about diet, weight, and exercise? Eat a healthy diet  Eat a diet that includes plenty of vegetables, fruits, low-fat dairy products, and lean protein. Do not eat a lot of foods that are high in solid fats, added sugars, or sodium. Maintain a healthy weight Body mass index (BMI) is used to identify weight problems. It estimates body fat based on height and weight. Your health care provider can help determine your BMI and help you achieve or maintain a healthy weight. Get regular exercise Get regular exercise. This is one of the most important things you can do for your health. Most adults should: Exercise for at least 150 minutes each week. The exercise should increase your heart rate and make you sweat (moderate-intensity exercise). Do strengthening exercises at least twice a week. This is in addition to the moderate-intensity exercise. Spend less time sitting. Even light physical activity can be beneficial. Watch cholesterol and blood lipids Have your blood tested for lipids and cholesterol at 47 years of age, then have this test every 5 years. Have your cholesterol levels checked more often if: Your lipid or cholesterol levels are high. You are older than 47 years of age. You are at high risk for heart disease. What should I know about cancer screening? Depending on your health history and family history, you may need to have cancer screening at various ages. This may include screening for: Breast cancer. Cervical cancer. Colorectal cancer. Skin cancer. Lung cancer. What should I know about heart disease, diabetes, and high blood pressure? Blood pressure and heart  disease High blood pressure causes heart disease and increases the risk of stroke. This is more likely to develop in people who have high blood pressure readings, are of African descent, or are overweight. Have your blood pressure checked: Every 3-5 years if you are 18-39 years of age. Every year if you are 40 years old or older. Diabetes Have regular diabetes screenings. This checks your fasting blood sugar level. Have the screening done: Once every three years after age 40 if you are at a normal weight and have a low risk for diabetes. More often and at a younger age if you are overweight or have a high risk for diabetes. What should I know about preventing infection? Hepatitis B If you have a higher risk for hepatitis B, you should be screened for this virus. Talk with your health care provider to find out if you are at risk for hepatitis B infection. Hepatitis C Testing is recommended for: Everyone born from 1945 through 1965. Anyone with known risk factors for hepatitis C. Sexually transmitted infections (STIs) Get screened for STIs, including gonorrhea and chlamydia, if: You are sexually active and are younger than 47 years of age. You are older than 47 years of age and your health care provider tells you that you are at risk for this type of infection. Your sexual activity has changed since you were last screened, and you are at increased risk for chlamydia or gonorrhea. Ask your health care provider if you are at risk. Ask your health care provider about whether you are at high risk for HIV. Your health care provider may recommend a prescription medicine   to help prevent HIV infection. If you choose to take medicine to prevent HIV, you should first get tested for HIV. You should then be tested every 3 months for as long as you are taking the medicine. Pregnancy If you are about to stop having your period (premenopausal) and you may become pregnant, seek counseling before you get  pregnant. Take 400 to 800 micrograms (mcg) of folic acid every day if you become pregnant. Ask for birth control (contraception) if you want to prevent pregnancy. Osteoporosis and menopause Osteoporosis is a disease in which the bones lose minerals and strength with aging. This can result in bone fractures. If you are 65 years old or older, or if you are at risk for osteoporosis and fractures, ask your health care provider if you should: Be screened for bone loss. Take a calcium or vitamin D supplement to lower your risk of fractures. Be given hormone replacement therapy (HRT) to treat symptoms of menopause. Follow these instructions at home: Lifestyle Do not use any products that contain nicotine or tobacco, such as cigarettes, e-cigarettes, and chewing tobacco. If you need help quitting, ask your health care provider. Do not use street drugs. Do not share needles. Ask your health care provider for help if you need support or information about quitting drugs. Alcohol use Do not drink alcohol if: Your health care provider tells you not to drink. You are pregnant, may be pregnant, or are planning to become pregnant. If you drink alcohol: Limit how much you use to 0-1 drink a day. Limit intake if you are breastfeeding. Be aware of how much alcohol is in your drink. In the [place] one drink equals one 12 oz bottle of beer (355 mL), one 5 oz glass of wine (148 mL), or one 1 oz glass of hard liquor (44 mL). General instructions Schedule regular health, dental, and eye exams. Stay current with your vaccines. Tell your health care provider if: You often feel depressed. You have ever been abused or do not feel safe at home. Summary Adopting a healthy lifestyle and getting preventive care are important in promoting health and wellness. Follow your health care provider's instructions about healthy diet, exercising, and getting tested or screened for diseases. Follow your health care provider's  instructions on monitoring your cholesterol and blood pressure. This information is not intended to replace advice given to you by your health care provider. Make sure you discuss any questions you have with your health care provider. Document Revised: 12/04/2020 Document Reviewed: 09/19/2018 Elsevier Patient Education  2022 [employer]  

## 2021-06-23 NOTE — Progress Notes (Signed)
Florence Canner, acting as a Education administrator for [employer], FNP.,have documented all relevant documentation on the behalf of [name], FNP,as directed by  [name], FNP while in the presence of [name], [place].  This visit occurred during the SARS-CoV-2 public health emergency.  Safety protocols were in place, including screening questions prior to the visit, additional usage of staff PPE, and extensive cleaning of exam room while observing appropriate contact time as indicated for disinfecting solutions.  Subjective:     Patient ID: Jasmine Schultz , female    DOB: April 14, 1976 , 45 y.o.   MRN: [id number]   Chief Complaint  Patient presents with   Annual Exam    HPI  Patient is here for HM. She is followed by [facility] for her GYN care. She has no concerns at this time.  Continues follow up with Dermatology and Rheumatologist (Autoimmune) - seen last yesterday, she will be rechecking her lupus panel. She has also seen a Dermatologist hoping to have a biopsy - she is to return in October and possibly do a biopsy. She is working 3rd shift, she is using a CPAP since June 2020 she does feel it has been effective to her quality of life.   Wt Readings from Last 3 Encounters: 06/23/21 : (!) 314 lb (142.4 kg) 12/16/20 : (!) 307 lb (139.3 kg) 08/14/20 : (!) 302 lb 6.4 oz (137.2 kg)      Past Medical History:  Diagnosis Date   Anxiety attack    Arthritis    Asthma    diagnosed in march 2019   Bursitis    Chronically dry eyes    GERD (gastroesophageal reflux disease)    IBS (irritable bowel syndrome)    Migraine    Multiple allergies    Sun allergy    Tendonitis      Family History  Problem Relation Age of Onset   Asthma Mother    Hypertension Mother    CAD Other    CVA Other    Aneurysm Other        brain aneurysm multiple family members   Breast cancer Other        grandmother   Sickle cell anemia Other        maternal cousin   Aneurysm Maternal Aunt     Diabetes Maternal Aunt    Diabetes Maternal Uncle    Diabetes Paternal Aunt    Heart disease Paternal Uncle    Diabetes Paternal Uncle    Breast cancer Maternal Grandmother    Diabetes Maternal Grandmother    Heart disease Maternal Grandfather    Aneurysm Maternal Grandfather    Diabetes Maternal Grandfather    Diabetes Paternal Grandmother    Heart disease Paternal Grandfather    Diabetes Paternal Grandfather      Current Outpatient Medications:    albuterol (VENTOLIN HFA) 108 (90 Base) MCG/ACT inhaler, Inhale 2 puffs into the lungs every 6 (six) hours as needed for wheezing or shortness of breath., Disp: 18 g, Rfl: 10   ALPRAZolam (XANAX) 0.25 MG tablet, Take 1 tablet (0.25 mg total) by mouth 3 (three) times daily as needed., Disp: 30 tablet, Rfl: 2   benzonatate (TESSALON PERLES) 100 MG capsule, Take 1 capsule (100 mg total) by mouth every 6 (six) hours as needed., Disp: 30 capsule, Rfl: 1   budesonide-formoterol (SYMBICORT) 80-4.5 MCG/ACT inhaler, Inhale 2 puffs into the lungs in the morning and at bedtime., Disp: 1 each, Rfl: 10   busPIRone (  BUSPAR) 5 MG tablet, Take 1 tablet (5 mg total) by mouth daily., Disp: 90 tablet, Rfl: 1   Cholecalciferol (VITAMIN D-3) 1000 units CAPS, Take 6,000 Units by mouth daily. , Disp: , Rfl:    clobetasol cream (TEMOVATE) 8.38 %, Apply 1 application topically daily as needed. , Disp: , Rfl: 3   cyanocobalamin 2000 MCG tablet, Take 2,000 mcg by mouth daily., Disp: , Rfl:    EPINEPHrine 0.3 mg/0.3 mL IJ SOAJ injection, Inject 0.3 mg into the muscle as needed for anaphylaxis., Disp: 1 each, Rfl: 5   Fe Cbn-Fe Gluc-FA-B12-C-DSS (FERRALET 90) 90-1 MG TABS, Take 1 tablet by mouth daily., Disp: 30 tablet, Rfl: 5   hydroxychloroquine (PLAQUENIL) 200 MG tablet, Take 200 mg by mouth daily., Disp: , Rfl:    levocetirizine (XYZAL) 5 MG tablet, Take 1 tablet (5 mg total) by mouth every evening., Disp: 30 tablet, Rfl: 10   montelukast (SINGULAIR) 10 MG tablet,  Take 1 tablet (10 mg total) by mouth at bedtime., Disp: 90 tablet, Rfl: 1   Multiple Vitamin (MULTIVITAMIN ADULT PO), Take 1 tablet by mouth daily at 6 (six) AM. Vita fusion, Disp: , Rfl:    ondansetron (ZOFRAN ODT) 4 MG disintegrating tablet, Take 1 tablet (4 mg total) by mouth every 8 (eight) hours as needed., Disp: 20 tablet, Rfl: 0   pantoprazole (PROTONIX) 40 MG tablet, Take 1 tablet (40 mg total) by mouth 2 (two) times daily., Disp: 180 tablet, Rfl: 1   Polyethyl Glycol-Propyl Glycol (SYSTANE OP), Apply 2 drops to eye daily., Disp: , Rfl:    Allergies  Allergen Reactions   Tomato Anaphylaxis and Rash   Bacitracin-Polymyxin B     Band aids cause- dark spots on her skin and itching   Chocolate    Cocoa    Oysters [Shellfish Allergy] Hives   Peanut Butter Flavor     Feels congested   Sulfa Antibiotics Hives      The patient states she had a tubal ligation.  No LMP recorded.. Negative for Dysmenorrhea and Negative for Menorrhagia. Negative for: breast discharge, breast lump(s), breast pain and breast self exam. Associated symptoms include abnormal vaginal bleeding. Pertinent negatives include abnormal bleeding (hematology), anxiety, decreased libido, depression, difficulty falling sleep, dyspareunia, history of infertility, nocturia, sexual dysfunction, sleep disturbances, urinary incontinence, urinary urgency, vaginal discharge and vaginal itching. Diet regular.The patient states her exercise level is moderate  The patient's tobacco use is:  Social History   Tobacco Use  Smoking Status Never  Smokeless Tobacco Never   She has been exposed to passive smoke. The patient's alcohol use is:  Social History   Substance and Sexual Activity  Alcohol Use Yes   Alcohol/week: 0.0 standard drinks   Comment: rarley   Additional information: Last pap - will get records from her GYN   Review of Systems  Constitutional: Negative.   HENT: Negative.    Eyes: Negative.   Respiratory:  Negative.    Cardiovascular: Negative.   Gastrointestinal: Negative.   Endocrine: Negative.   Genitourinary: Negative.   Musculoskeletal: Negative.   Skin: Negative.   Allergic/Immunologic: Negative.   Neurological: Negative.   Hematological: Negative.   Psychiatric/Behavioral: Negative.      Today's Vitals   06/23/21 0956  BP: 128/70  Pulse: 79  Temp: 97.9 F (36.6 C)  TempSrc: Oral  Weight: (!) 314 lb (142.4 kg)  Height: 5' 5.2" (1.656 m)   Body mass index is 51.93 kg/m.  Wt Readings from Last 3  Encounters:  06/23/21 (!) 314 lb (142.4 kg)  12/16/20 (!) 307 lb (139.3 kg)  08/14/20 (!) 302 lb 6.4 oz (137.2 kg)    Objective:  Physical Exam Constitutional:      General: She is not in acute distress.    Appearance: Normal appearance. She is well-developed. She is obese.  HENT:     Head: Normocephalic and atraumatic.     Right Ear: Hearing, tympanic membrane, ear canal and external ear normal. There is no impacted cerumen.     Left Ear: Hearing, tympanic membrane, ear canal and external ear normal. There is no impacted cerumen.     Nose:     Comments: Deferred - masked    Mouth/Throat:     Comments: Deferred - masked Eyes:     General: Lids are normal.     Extraocular Movements: Extraocular movements intact.     Conjunctiva/sclera: Conjunctivae normal.     Pupils: Pupils are equal, round, and reactive to light.     Funduscopic exam:    Right eye: No papilledema.        Left eye: No papilledema.  Neck:     Thyroid: No thyroid mass.     Vascular: No carotid bruit.  Cardiovascular:     Rate and Rhythm: Normal rate and regular rhythm.     Pulses: Normal pulses.     Heart sounds: Normal heart sounds. No murmur heard. Pulmonary:     Effort: Pulmonary effort is normal. No respiratory distress.     Breath sounds: Normal breath sounds. No wheezing.  Chest:     Chest wall: No mass.  Breasts:    Tanner Score is 5.     Right: Normal. No mass or tenderness.     Left:  Normal. No mass or tenderness.  Abdominal:     General: Abdomen is flat. Bowel sounds are normal. There is no distension.     Palpations: Abdomen is soft.     Tenderness: There is no abdominal tenderness.  Genitourinary:    Rectum: Guaiac result negative.  Musculoskeletal:        General: No swelling. Normal range of motion.     Cervical back: Full passive range of motion without pain, normal range of motion and neck supple.     Right lower leg: No edema.     Left lower leg: No edema.  Lymphadenopathy:     Upper Body:     Right upper body: No supraclavicular, axillary or pectoral adenopathy.     Left upper body: No supraclavicular, axillary or pectoral adenopathy.  Skin:    General: Skin is warm and dry.     Capillary Refill: Capillary refill takes less than 2 seconds.     Comments: Left foot lump and right wrist lump, alopecia  Neurological:     General: No focal deficit present.     Mental Status: She is alert and oriented to person, place, and time.     Cranial Nerves: No cranial nerve deficit.     Sensory: No sensory deficit.  Psychiatric:        Mood and Affect: Mood normal.        Behavior: Behavior normal.        Thought Content: Thought content normal.        Judgment: Judgment normal.        Assessment And Plan:     1. Encounter for annual physical exam Behavior modifications discussed and diet history reviewed.   Pt will  continue to exercise regularly and modify diet with low GI, plant based foods and decrease intake of processed foods.  Recommend intake of daily multivitamin, Vitamin D, and calcium.  Recommend mammogram and colonoscopy for preventive screenings, as well as recommend immunizations that include influenza, TDAP (up to date) - Lipid panel  2. Iron deficiency anemia, unspecified iron deficiency anemia type Will check her iron studies and encouraged to increase intake of iron rich foods. - CBC - Iron, TIBC and Ferritin Panel  3. Anxiety Stable, no  current medications  4. Class 3 severe obesity due to excess calories without serious comorbidity with body mass index (BMI) of 50.0 to 59.9 in adult [facility]) Chronic Discussed healthy diet and regular exercise options  Encouraged to exercise at least 150 minutes per week with 2 days of strength training as tolerated She is encouraged to strive for BMI less than 30 to decrease cardiac risk.   5. Encounter for immunization Will give tetanus vaccine today while in office. Refer to order management. TDAP will be administered to adults 27-74 years old every 10 years. - Tdap vaccine greater than or equal to 7yo IM  6. Abnormal glucose No current medications, will check random insulin as this could be affecting her weight loss - Hemoglobin A1c - CMP14+EGFR - Insulin, random - TSH  7. Weight gain Will check metabolic causes - Insulin, random - TSH      Patient was given opportunity to ask questions. Patient verbalized understanding of the plan and was able to repeat key elements of the plan. All questions were answered to their satisfaction.   [name], FNP   I, [name], FNP, have reviewed all documentation for this visit. The documentation on 06/23/21 for the exam, diagnosis, procedures, and orders are all accurate and complete.  THE PATIENT IS ENCOURAGED TO PRACTICE SOCIAL DISTANCING DUE TO THE COVID-19 PANDEMIC.

## 2021-06-24 LAB — CBC
Hematocrit: 33 % — ABNORMAL LOW (ref 34.0–46.6)
Hemoglobin: 11.1 g/dL (ref 11.1–15.9)
MCH: 29.3 pg (ref 26.6–33.0)
MCHC: 33.6 g/dL (ref 31.5–35.7)
MCV: 87 fL (ref 79–97)
Platelets: 449 10*3/uL (ref 150–450)
RBC: 3.79 x10E6/uL (ref 3.77–5.28)
RDW: 12.6 % (ref 11.7–15.4)
WBC: 4.5 10*3/uL (ref 3.4–10.8)

## 2021-06-24 LAB — IRON,TIBC AND FERRITIN PANEL
Ferritin: 37 ng/mL (ref 15–150)
Iron Saturation: 13 % — ABNORMAL LOW (ref 15–55)
Iron: 36 ug/dL (ref 27–159)
Total Iron Binding Capacity: 279 ug/dL (ref 250–450)
UIBC: 243 ug/dL (ref 131–425)

## 2021-06-24 LAB — CMP14+EGFR
ALT: 12 IU/L (ref 0–32)
AST: 17 IU/L (ref 0–40)
Albumin/Globulin Ratio: 1.7 (ref 1.2–2.2)
Albumin: 4.2 g/dL (ref 3.8–4.8)
Alkaline Phosphatase: 70 IU/L (ref 44–121)
BUN/Creatinine Ratio: 10 (ref 9–23)
BUN: 8 mg/dL (ref 6–24)
Bilirubin Total: 0.3 mg/dL (ref 0.0–1.2)
CO2: 23 mmol/L (ref 20–29)
Calcium: 10 mg/dL (ref 8.7–10.2)
Chloride: 103 mmol/L (ref 96–106)
Creatinine, Ser: 0.79 mg/dL (ref 0.57–1.00)
Globulin, Total: 2.5 g/dL (ref 1.5–4.5)
Glucose: 78 mg/dL (ref 65–99)
Potassium: 4.9 mmol/L (ref 3.5–5.2)
Sodium: 138 mmol/L (ref 134–144)
Total Protein: 6.7 g/dL (ref 6.0–8.5)
eGFR: 93 mL/min/{1.73_m2} (ref >59)

## 2021-06-24 LAB — LIPID PANEL
Chol/HDL Ratio: 3.4 ratio (ref 0.0–4.4)
Cholesterol, Total: 244 mg/dL — ABNORMAL HIGH (ref 100–199)
HDL: 72 mg/dL (ref >39)
LDL Chol Calc (NIH): 164 mg/dL — ABNORMAL HIGH (ref 0–99)
Triglycerides: 52 mg/dL (ref 0–149)
VLDL Cholesterol Cal: 8 mg/dL (ref 5–40)

## 2021-06-24 LAB — TSH: TSH: 2.92 u[IU]/mL (ref 0.450–4.500)

## 2021-06-24 LAB — HEMOGLOBIN A1C
Est. average glucose Bld gHb Est-mCnc: 103 mg/dL
Hgb A1c MFr Bld: 5.2 % (ref 4.8–5.6)

## 2021-06-24 LAB — INSULIN, RANDOM: INSULIN: 13.9 u[IU]/mL (ref 2.6–24.9)

## 2021-07-07 DIAGNOSIS — R2231 Localized swelling, mass and lump, right upper limb: Secondary | ICD-10-CM | POA: Insufficient documentation

## 2021-08-30 ENCOUNTER — Other Ambulatory Visit: Payer: Self-pay | Admitting: Internal Medicine

## 2021-08-30 DIAGNOSIS — J4540 Moderate persistent asthma, uncomplicated: Secondary | ICD-10-CM

## 2021-09-06 ENCOUNTER — Other Ambulatory Visit: Payer: Self-pay | Admitting: Nurse Practitioner

## 2021-09-06 DIAGNOSIS — J45909 Unspecified asthma, uncomplicated: Secondary | ICD-10-CM

## 2021-09-17 ENCOUNTER — Telehealth: Payer: Self-pay

## 2021-09-17 NOTE — Telephone Encounter (Signed)
Left pt vm to give the office a call back. Provider wants to make sure she is taking montelukast 10mg  daily.

## 2021-09-27 ENCOUNTER — Telehealth: Payer: Self-pay | Admitting: Internal Medicine

## 2021-09-27 ENCOUNTER — Other Ambulatory Visit: Payer: Self-pay | Admitting: Nurse Practitioner

## 2021-09-27 ENCOUNTER — Other Ambulatory Visit (HOSPITAL_COMMUNITY): Payer: Self-pay

## 2021-09-27 DIAGNOSIS — D509 Iron deficiency anemia, unspecified: Secondary | ICD-10-CM

## 2021-09-27 NOTE — Telephone Encounter (Signed)
Patient is aware of below message and voiced her understanding.  Nothing further needed at this time.   

## 2021-09-27 NOTE — Telephone Encounter (Signed)
Spoke to patient, who stated that Symbicort is no longer covered by insurance. She is requesting an alternative.   PA team, can you guys assist with this? Thanks

## 2021-11-15 ENCOUNTER — Telehealth: Payer: Self-pay

## 2021-11-15 NOTE — Telephone Encounter (Signed)
Called and LVM in regards to bringing sd card on Wednesday.

## 2021-11-17 ENCOUNTER — Telehealth: Payer: Self-pay | Admitting: Internal Medicine

## 2021-11-17 ENCOUNTER — Other Ambulatory Visit: Payer: Self-pay

## 2021-11-17 ENCOUNTER — Encounter: Payer: Self-pay | Admitting: Internal Medicine

## 2021-11-17 ENCOUNTER — Ambulatory Visit (INDEPENDENT_AMBULATORY_CARE_PROVIDER_SITE_OTHER): Payer: Commercial Managed Care - PPO | Admitting: Internal Medicine

## 2021-11-17 VITALS — BP 110/76 | HR 77 | Temp 97.1°F | Ht 66.0 in | Wt 320.6 lb

## 2021-11-17 DIAGNOSIS — J4540 Moderate persistent asthma, uncomplicated: Secondary | ICD-10-CM

## 2021-11-17 DIAGNOSIS — G4733 Obstructive sleep apnea (adult) (pediatric): Secondary | ICD-10-CM

## 2021-11-17 DIAGNOSIS — J4520 Mild intermittent asthma, uncomplicated: Secondary | ICD-10-CM

## 2021-11-17 MED ORDER — BUDESONIDE-FORMOTEROL FUMARATE 80-4.5 MCG/ACT IN AERO: 2.0000 | INHALATION_SPRAY | Freq: Two times a day (BID) | RESPIRATORY_TRACT | 10 refills | Status: DC

## 2021-11-17 MED ORDER — ALBUTEROL SULFATE HFA 108 (90 BASE) MCG/ACT IN AERS: 2.0000 | INHALATION_SPRAY | Freq: Four times a day (QID) | RESPIRATORY_TRACT | 10 refills | Status: DC | PRN

## 2021-11-17 MED ORDER — BUDESONIDE-FORMOTEROL FUMARATE 160-4.5 MCG/ACT IN AERO: 2.0000 | INHALATION_SPRAY | Freq: Two times a day (BID) | RESPIRATORY_TRACT | 12 refills | Status: DC

## 2021-11-17 NOTE — Progress Notes (Signed)
PULMONARY OFFICE FOLLOW UP NOTE    Primary MD: Glendale Chard  Date of initial office visit: 06/15/18  Reason for consultation: Post hospitalization after intubation for status asthmaticus  PT PROFILE: 46 y.o. female never smoker admitted to [facility] 8/8-8/14/19 with status asthmaticus. Intubated in ED and briefly required epinephrine an ketamine infusions. Self extubated on 8/11.   DATA: 05/19/18 CTA chest: no acute findings except mild dependent atelectasis 08/07/2018 PFTs: FVC: 2.92 > 2.98 L (91 > 93 %pred), FEV1: 2.30 > 2.47 L (88 > 95% pred), FEV1/FVC: 79%, TLC: 4.5 to L (86 %pred), DLCO 78 %pred 08/2018 IgE levels 135, EOS 400   10/2018 HST AHI: 78.5 / h  INTERVAL: Last visit 08/16/2018.  In interim, diagnosed with OSA [facility]).  No major pulmonary events  SYNOPSIS  She remains on Symbicort for asthma.  She also is on montelukast and levocetirizine.  She rarely uses albuterol.  She continues to have problems with hoarseness which "comes and goes".  She cannot identify any factors that exacerbate her hoarseness.  She denies CP, fever, purulent sputum, hemoptysis, LE edema and calf tenderness. Persistent, intermittent hoarseness raises concern for possibility of vocal cord dysfunction syndrome contributing to her "asthma" symptoms.  VCDS can be exacerbated by inhaled corticosteroids  CC Follow-up asthma Follow-up OSA  HPI Patient has a diagnosis of asthma Previous ventilatory support in August 2019 Patient continues to take Symbicort and albuterol as needed Patient is prednisone and antibiotic responsive  Previous diagnosis of severe sleep apnea AHI of 78 Will need compliance report Patient states she continues to use her CPAP as prescribed Patient has excellent compliance report reviewed in detail   +exposure to carts, chest heaviness and wheezing Advised to avoid cats  No exacerbation at this time No evidence of heart failure at this time No evidence or  signs of infection at this time No respiratory distress No fevers, chills, nausea, vomiting, diarrhea No evidence of lower extremity edema No evidence hemoptysis  Review of Systems:  Gen:  Denies  fever, sweats, chills weight loss  HEENT: Denies blurred vision, double vision, ear pain, eye pain, hearing loss, nose bleeds, sore throat Cardiac:  No dizziness, chest pain or heaviness, chest tightness,edema, No JVD Resp:   No cough, -sputum production, -shortness of breath,-wheezing, -hemoptysis,  Other:  All other systems negative  BP 110/76 (BP Location: Left Arm, Patient Position: Sitting, Cuff Size: Normal)    Pulse 77    Temp (!) 97.1 F (36.2 C) (Oral)    Ht 5\' 6"  (1.676 m)    Wt (!) 320 lb 9.6 oz (145.4 kg)    SpO2 100%    BMI 51.75 kg/m   Physical Examination:   General Appearance: No distress  Neuro:without focal findings,  speech normal,  HEENT: PERRLA, EOM intact.   Pulmonary: normal breath sounds, No wheezing.  CardiovascularNormal S1,S2.  No m/r/g.   ALL OTHER ROS ARE NEGATIVE    DATA:   BMP Latest Ref Rng & Units 06/23/2021 12/16/2020 06/25/2020  Glucose 65 - 99 mg/dL 78 82 -  BUN 6 - 24 mg/dL 8 7 5   Creatinine 0.57 - 1.00 mg/dL 0.79 0.73 0.9  BUN/Creat Ratio 9 - 23 10 10  -  Sodium 134 - 144 mmol/L 138 139 141  Potassium 3.5 - 5.2 mmol/L 4.9 4.4 5.3  Chloride 96 - 106 mmol/L 103 104 105  CO2 20 - 29 mmol/L 23 22 -  Calcium 8.7 - 10.2 mg/dL 10.0 9.4 9.7  CBC Latest Ref Rng & Units 06/23/2021 12/16/2020 06/25/2020  WBC 3.4 - 10.8 x10E3/uL 4.5 4.2 5.2  Hemoglobin 11.1 - 15.9 g/dL 11.1 11.9 12.2  Hematocrit 34.0 - 46.6 % 33.0(L) 36.8 38  Platelets 150 - 450 x10E3/uL 449 450 424(A)     IMPRESSION:   Mild to moderate persistent asthma  Continue inhalers as prescribed Continue Symbicort as prescribed Albuterol as needed Avoid allergens Patient is allergic to cats patient advised to avoid cats No indication of infection or asthma exacerbation at this  time  Severe OSA Continue CPAP as prescribed Patient has excellent compliance report Continue as prescribed  Obesity -recommend significant weight loss -recommend changing diet  Deconditioned state -Recommend increased daily activity and exercise   MEDICATION ADJUSTMENTS/LABS AND TESTS ORDERED: Continue CPAP Continue Symbicort as prescribed Avoid Triggers and cats Patient would like a few doses of Symbicort to toggle between each dosage based on her symptoms   CURRENT MEDICATIONS REVIEWED AT LENGTH WITH PATIENT TODAY   Patient satisfied with Plan of action and management. All questions answered  Follow-up 1 year  TOTAL TIME SPENT 22 mins  Sai Moura Patricia Pesa, M.D.  [facility]  Medical Director [place] Director [facility] Cardio-Pulmonary Department

## 2021-11-17 NOTE — Patient Instructions (Addendum)
Continue CPAP Continue Symbicort as prescribed Avoid Triggers and cats

## 2021-11-18 NOTE — Telephone Encounter (Addendum)
Symbicort 160 and Symbicort 80 were sent to pharmacy yesterday.  Dr. Mortimer Fries, please verify which strength patient should be taking. Thanks

## 2021-11-22 NOTE — Telephone Encounter (Signed)
Spoke to [place] with [employer] and relayed below message.  She voiced her understanding and had no further questions.  Nothing further needed at this time.

## 2021-12-22 ENCOUNTER — Encounter: Payer: Self-pay | Admitting: Nurse Practitioner

## 2021-12-22 ENCOUNTER — Ambulatory Visit (INDEPENDENT_AMBULATORY_CARE_PROVIDER_SITE_OTHER): Payer: Commercial Managed Care - PPO | Admitting: Nurse Practitioner

## 2021-12-22 ENCOUNTER — Other Ambulatory Visit: Payer: Self-pay

## 2021-12-22 VITALS — BP 132/80 | HR 73 | Temp 98.8°F | Ht 66.0 in | Wt 326.4 lb

## 2021-12-22 DIAGNOSIS — Z6841 Body Mass Index (BMI) 40.0 and over, adult: Secondary | ICD-10-CM

## 2021-12-22 DIAGNOSIS — Z2821 Immunization not carried out because of patient refusal: Secondary | ICD-10-CM

## 2021-12-22 DIAGNOSIS — D509 Iron deficiency anemia, unspecified: Secondary | ICD-10-CM | POA: Diagnosis not present

## 2021-12-22 DIAGNOSIS — F419 Anxiety disorder, unspecified: Secondary | ICD-10-CM

## 2021-12-22 DIAGNOSIS — E782 Mixed hyperlipidemia: Secondary | ICD-10-CM | POA: Diagnosis not present

## 2021-12-22 DIAGNOSIS — Z1211 Encounter for screening for malignant neoplasm of colon: Secondary | ICD-10-CM

## 2021-12-22 DIAGNOSIS — Z139 Encounter for screening, unspecified: Secondary | ICD-10-CM

## 2021-12-22 MED ORDER — FERRAPLUS 90 90-1 MG PO TABS: 1.0000 | ORAL_TABLET | Freq: Every day | ORAL | 1 refills | Status: DC

## 2021-12-22 NOTE — Progress Notes (Signed)
?I,Yamilka J Llittleton,acting as a Education administrator for [employer], FNP.,have documented all relevant documentation on the behalf of [name], FNP,as directed by  [name], FNP while in the presence of [name], [place].  ? ?This visit occurred during the SARS-CoV-2 public health emergency.  Safety protocols were in place, including screening questions prior to the visit, additional usage of staff PPE, and extensive cleaning of exam room while observing appropriate contact time as indicated for disinfecting solutions. ? ?Subjective:  ?  ? Patient ID: Jasmine Schultz , female    DOB: [birth year] , 46 y.o.   MRN: [id number] ? ? ?Chief Complaint  ?Patient presents with  ? Anxiety  ? ? ?HPI ? ?Patient presents today for anxiety f/u. She reports she has been under a lot of stress due to her husband having a heart attack last week. She has recently had a patch test in February, this was done at [facility]. She had several allergies and has had to make changes. She has also seen Dr Avon Gully and her Dr Jenell Milliner (pulmonology), and Dr. Katy Fitch (Rx for readers) ? ?Wt Readings from Last 3 Encounters: ?12/22/21 : (!) 326 lb 6.4 oz (148.1 kg) ?11/17/21 : (!) 320 lb 9.6 oz (145.4 kg) ?06/23/21 : (!) 314 lb (142.4 kg) ?  ? ?Anxiety ?Presents for follow-up visit.  ? ?  ? ?Past Medical History:  ?Diagnosis Date  ? Anxiety attack   ? Arthritis   ? Asthma   ? diagnosed in march 2019  ? Bursitis   ? Chronically dry eyes   ? GERD (gastroesophageal reflux disease)   ? IBS (irritable bowel syndrome)   ? Migraine   ? Multiple allergies   ? Sun allergy   ? Tendonitis   ?  ? ?Family History  ?Problem Relation Age of Onset  ? Asthma Mother   ? Hypertension Mother   ? CAD Other   ? CVA Other   ? Aneurysm Other   ?     brain aneurysm multiple family members  ? Breast cancer Other   ?     grandmother  ? Sickle cell anemia Other   ?     maternal cousin  ? Aneurysm Maternal Aunt   ? Diabetes Maternal Aunt   ? Diabetes Maternal Uncle   ? Diabetes Paternal  Aunt   ? Heart disease Paternal Uncle   ? Diabetes Paternal Uncle   ? Breast cancer Maternal Grandmother   ? Diabetes Maternal Grandmother   ? Heart disease Maternal Grandfather   ? Aneurysm Maternal Grandfather   ? Diabetes Maternal Grandfather   ? Diabetes Paternal Grandmother   ? Heart disease Paternal Grandfather   ? Diabetes Paternal Grandfather   ? ? ? ?Current Outpatient Medications:  ?  albuterol (VENTOLIN HFA) 108 (90 Base) MCG/ACT inhaler, Inhale 2 puffs into the lungs every 6 (six) hours as needed for wheezing or shortness of breath., Disp: 18 g, Rfl: 10 ?  ALPRAZolam (XANAX) 0.25 MG tablet, Take 1 tablet (0.25 mg total) by mouth 3 (three) times daily as needed., Disp: 30 tablet, Rfl: 2 ?  budesonide-formoterol (SYMBICORT) 160-4.5 MCG/ACT inhaler, Inhale 2 puffs into the lungs 2 (two) times daily., Disp: 1 each, Rfl: 12 ?  budesonide-formoterol (SYMBICORT) 80-4.5 MCG/ACT inhaler, Inhale 2 puffs into the lungs in the morning and at bedtime., Disp: 1 each, Rfl: 10 ?  busPIRone (BUSPAR) 5 MG tablet, Take 1 tablet (5 mg total) by mouth daily., Disp: 90 tablet, Rfl: 1 ?  Cholecalciferol (VITAMIN D-3) 1000 units CAPS, Take 6,000 Units by mouth daily. , Disp: , Rfl:  ?  clobetasol cream (TEMOVATE) 2.29 %, Apply 1 application topically daily as needed. , Disp: , Rfl: 3 ?  EPINEPHrine 0.3 mg/0.3 mL IJ SOAJ injection, Inject 0.3 mg into the muscle as needed for anaphylaxis., Disp: 1 each, Rfl: 5 ?  hydroxychloroquine (PLAQUENIL) 200 MG tablet, Take 200 mg by mouth daily., Disp: , Rfl:  ?  Iron-Folic [id number] (FERRAPLUS 90) 90-1 MG TABS, Take 1 tablet by mouth daily., Disp: 90 tablet, Rfl: 1 ?  levocetirizine (XYZAL) 5 MG tablet, Take 1 tablet (5 mg total) by mouth every evening., Disp: 30 tablet, Rfl: 10 ?  montelukast (SINGULAIR) 10 MG tablet, TAKE 1 TABLET(10 MG) BY MOUTH AT BEDTIME, Disp: 90 tablet, Rfl: 1 ?  Multiple Vitamin (MULTIVITAMIN ADULT PO), Take 1 tablet by mouth daily at 6 (six) AM. Vita  fusion, Disp: , Rfl:  ?  ondansetron (ZOFRAN ODT) 4 MG disintegrating tablet, Take 1 tablet (4 mg total) by mouth every 8 (eight) hours as needed., Disp: 20 tablet, Rfl: 0 ?  pantoprazole (PROTONIX) 40 MG tablet, Take 1 tablet (40 mg total) by mouth 2 (two) times daily., Disp: 180 tablet, Rfl: 1 ?  Polyethyl Glycol-Propyl Glycol (SYSTANE OP), Apply 2 drops to eye daily., Disp: , Rfl:   ? ?Allergies  ?Allergen Reactions  ? Tomato Anaphylaxis and Rash  ? Bacitracin-Polymyxin B   ?  Band aids cause- dark spots on her skin and itching  ? Chocolate   ? Cocoa   ? Oysters [Shellfish Allergy] Hives  ? Peanut Butter Flavor   ?  Feels congested  ? Sulfa Antibiotics Hives  ?  ? ?Review of Systems  ? ?Today's Vitals  ? 12/22/21 1109  ?BP: 132/80  ?Pulse: 73  ?Temp: 98.8 ?F (37.1 ?C)  ?SpO2: 98%  ?Weight: (!) 326 lb 6.4 oz (148.1 kg)  ?Height: '5\' 6"'$  (1.676 m)  ?PainSc: 3   ? ?Body mass index is 52.68 kg/m?.  ? ?Objective:  ?Physical Exam ?Vitals reviewed.  ?Constitutional:   ?   General: She is not in acute distress. ?   Appearance: Normal appearance. She is obese.  ?Cardiovascular:  ?   Rate and Rhythm: Normal rate and regular rhythm.  ?   Pulses: Normal pulses.  ?   Heart sounds: Normal heart sounds. No murmur heard. ?Pulmonary:  ?   Effort: Pulmonary effort is normal. No respiratory distress.  ?   Breath sounds: Normal breath sounds. No wheezing.  ?Skin: ?   General: Skin is warm and dry.  ?Neurological:  ?   General: No focal deficit present.  ?   Mental Status: She is alert and oriented to person, place, and time.  ?   Cranial Nerves: No cranial nerve deficit.  ?Psychiatric:     ?   Mood and Affect: Mood normal.     ?   Behavior: Behavior normal.     ?   Thought Content: Thought content normal.     ?   Judgment: Judgment normal.  ?  ? ?   ?Assessment And Plan:  ?   ?1. Anxiety ?Comments: Stable, continue current regimen ? ?2. Mixed hyperlipidemia ?Comments: Stable, will check levels, diet controlled ?- Lipid panel ? ?3.  COVID-19 vaccination declined ?Declines covid 19 vaccine. Discussed risk of covid 48 and if she changes her mind about the vaccine to call the office.  Encouraged to take multivitamin, vitamin  d, vitamin c and zinc to increase immune system. Aware can call office if would like to have vaccine here at office.  ?She has multiple allergies and is recommended by the allergist to not get ? ?4. Class 3 severe obesity due to excess calories without serious comorbidity with body mass index (BMI) of 50.0 to 59.9 in adult [facility]) ?Chronic ?Discussed healthy diet and regular exercise options  ?Encouraged to exercise at least 150 minutes per week with 2 days of strength training ? ?5. Iron deficiency anemia, unspecified iron deficiency anemia type ?- Iron-Folic [id number] (FERRAPLUS 90) 90-1 MG TABS; Take 1 tablet by mouth daily.  Dispense: 90 tablet; Refill: 1 ? ?6. Encounter for screening colonoscopy ?According to USPTF Colorectal cancer Screening guidelines. Colonoscopy is recommended every 10 years, starting at age 37 years. ?Will refer to GI for colon cancer screening. ?- Ambulatory referral to Gastroenterology ? ?7. Encounter for screening ?- ABO AND RH  ?  ? ? ?Patient was given opportunity to ask questions. Patient verbalized understanding of the plan and was able to repeat key elements of the plan. All questions were answered to their satisfaction.  ?[name], FNP  ?Jasmine Bradley, FNP, have reviewed all documentation for this visit. The documentation on 12/22/21 for the exam, diagnosis, procedures, and orders are all accurate and complete.  ? ? ?IF YOU HAVE BEEN REFERRED TO A SPECIALIST, IT MAY TAKE 1-2 WEEKS TO SCHEDULE/PROCESS THE REFERRAL. IF YOU HAVE NOT HEARD FROM US/SPECIALIST IN TWO WEEKS, PLEASE GIVE [place] A CALL AT [phone].  ? ?THE PATIENT IS ENCOURAGED TO PRACTICE SOCIAL DISTANCING DUE TO THE COVID-19 PANDEMIC.   ?

## 2021-12-23 LAB — LIPID PANEL
Chol/HDL Ratio: 3.6 ratio (ref 0.0–4.4)
Cholesterol, Total: 258 mg/dL — ABNORMAL HIGH (ref 100–199)
HDL: 72 mg/dL (ref >39)
LDL Chol Calc (NIH): 179 mg/dL — ABNORMAL HIGH (ref 0–99)
Triglycerides: 46 mg/dL (ref 0–149)
VLDL Cholesterol Cal: 7 mg/dL (ref 5–40)

## 2021-12-23 LAB — ABO AND RH: Rh Factor: POSITIVE

## 2022-01-18 ENCOUNTER — Other Ambulatory Visit: Payer: Self-pay | Admitting: Gastroenterology

## 2022-02-25 ENCOUNTER — Other Ambulatory Visit: Payer: Self-pay

## 2022-02-25 DIAGNOSIS — J45909 Unspecified asthma, uncomplicated: Secondary | ICD-10-CM

## 2022-02-25 MED ORDER — MONTELUKAST SODIUM 10 MG PO TABS: ORAL_TABLET | ORAL | 1 refills | Status: DC

## 2022-02-28 ENCOUNTER — Other Ambulatory Visit: Payer: Self-pay

## 2022-02-28 DIAGNOSIS — K219 Gastro-esophageal reflux disease without esophagitis: Secondary | ICD-10-CM

## 2022-02-28 MED ORDER — PANTOPRAZOLE SODIUM 40 MG PO TBEC: 40.0000 mg | DELAYED_RELEASE_TABLET | Freq: Two times a day (BID) | ORAL | 1 refills | Status: DC

## 2022-04-01 ENCOUNTER — Other Ambulatory Visit: Payer: Self-pay

## 2022-04-01 DIAGNOSIS — J4540 Moderate persistent asthma, uncomplicated: Secondary | ICD-10-CM

## 2022-04-01 MED ORDER — BUDESONIDE-FORMOTEROL FUMARATE 80-4.5 MCG/ACT IN AERO: 2.0000 | INHALATION_SPRAY | Freq: Two times a day (BID) | RESPIRATORY_TRACT | 2 refills | Status: DC

## 2022-04-01 MED ORDER — BUDESONIDE-FORMOTEROL FUMARATE 160-4.5 MCG/ACT IN AERO: 2.0000 | INHALATION_SPRAY | Freq: Two times a day (BID) | RESPIRATORY_TRACT | 2 refills | Status: DC

## 2022-04-06 ENCOUNTER — Encounter (HOSPITAL_COMMUNITY): Payer: Self-pay | Admitting: Gastroenterology

## 2022-04-15 ENCOUNTER — Ambulatory Visit (HOSPITAL_COMMUNITY)
Admission: RE | Admit: 2022-04-15 | Discharge: 2022-04-15 | Disposition: A | Discharge status: Home / Self Care | Payer: Commercial Managed Care - PPO | Source: Ambulatory Visit | Attending: Gastroenterology | Admitting: Gastroenterology

## 2022-04-15 ENCOUNTER — Ambulatory Visit (HOSPITAL_COMMUNITY): Payer: Commercial Managed Care - PPO | Admitting: Anesthesiology

## 2022-04-15 ENCOUNTER — Encounter (HOSPITAL_COMMUNITY): Payer: Self-pay | Admitting: Gastroenterology

## 2022-04-15 ENCOUNTER — Ambulatory Visit (HOSPITAL_BASED_OUTPATIENT_CLINIC_OR_DEPARTMENT_OTHER): Payer: Commercial Managed Care - PPO | Admitting: Anesthesiology

## 2022-04-15 ENCOUNTER — Encounter (HOSPITAL_COMMUNITY)
Admission: RE | Disposition: A | Discharge status: Home / Self Care | Payer: Self-pay | Source: Ambulatory Visit | Attending: Gastroenterology

## 2022-04-15 DIAGNOSIS — Z6841 Body Mass Index (BMI) 40.0 and over, adult: Secondary | ICD-10-CM | POA: Diagnosis not present

## 2022-04-15 DIAGNOSIS — D120 Benign neoplasm of cecum: Secondary | ICD-10-CM | POA: Insufficient documentation

## 2022-04-15 DIAGNOSIS — G4730 Sleep apnea, unspecified: Secondary | ICD-10-CM | POA: Diagnosis not present

## 2022-04-15 DIAGNOSIS — Z79899 Other long term (current) drug therapy: Secondary | ICD-10-CM | POA: Insufficient documentation

## 2022-04-15 DIAGNOSIS — Z1211 Encounter for screening for malignant neoplasm of colon: Secondary | ICD-10-CM | POA: Diagnosis not present

## 2022-04-15 DIAGNOSIS — D500 Iron deficiency anemia secondary to blood loss (chronic): Secondary | ICD-10-CM | POA: Insufficient documentation

## 2022-04-15 DIAGNOSIS — K635 Polyp of colon: Secondary | ICD-10-CM | POA: Diagnosis not present

## 2022-04-15 DIAGNOSIS — D123 Benign neoplasm of transverse colon: Secondary | ICD-10-CM | POA: Diagnosis not present

## 2022-04-15 HISTORY — PX: COLONOSCOPY WITH PROPOFOL: SHX5780

## 2022-04-15 HISTORY — PX: POLYPECTOMY: SHX5525

## 2022-04-15 SURGERY — COLONOSCOPY WITH PROPOFOL
Anesthesia: Monitor Anesthesia Care

## 2022-04-15 MED ORDER — PROPOFOL 500 MG/50ML IV EMUL
INTRAVENOUS | Status: DC | PRN
  Administered 2022-04-15: 130 ug/kg/min via INTRAVENOUS

## 2022-04-15 MED ORDER — LIDOCAINE 20MG/ML (2%) 15 ML SYRINGE OPTIME
INTRAMUSCULAR | Status: DC | PRN
  Administered 2022-04-15: 100 mg via INTRAVENOUS

## 2022-04-15 MED ORDER — SODIUM CHLORIDE 0.9 % IV SOLN: INTRAVENOUS | Status: DC

## 2022-04-15 MED ORDER — PROPOFOL 1000 MG/100ML IV EMUL
INTRAVENOUS | Status: AC
  Filled 2022-04-15: qty 100

## 2022-04-15 MED ORDER — PROPOFOL 10 MG/ML IV BOLUS
INTRAVENOUS | Status: DC | PRN
  Administered 2022-04-15 (×2): 20 mg via INTRAVENOUS
  Administered 2022-04-15: 10 mg via INTRAVENOUS
  Administered 2022-04-15: 20 mg via INTRAVENOUS
  Administered 2022-04-15: 10 mg via INTRAVENOUS

## 2022-04-15 MED ORDER — LACTATED RINGERS IV SOLN
INTRAVENOUS | Status: AC | PRN
  Administered 2022-04-15: 1000 mL via INTRAVENOUS

## 2022-04-15 SURGICAL SUPPLY — 22 items

## 2022-04-15 NOTE — Anesthesia Postprocedure Evaluation (Signed)
Anesthesia Post Note  Patient: Jasmine Schultz  Procedure(s) Performed: COLONOSCOPY WITH PROPOFOL POLYPECTOMY     Patient location during evaluation: PACU Anesthesia Type: MAC Level of consciousness: awake and alert Pain management: pain level controlled Vital Signs Assessment: post-procedure vital signs reviewed and stable Respiratory status: spontaneous breathing, nonlabored ventilation and respiratory function stable Cardiovascular status: blood pressure returned to baseline Postop Assessment: no apparent nausea or vomiting Anesthetic complications: no   No notable events documented.  Last Vitals:  Vitals:   04/15/22 0953 04/15/22 1003  BP: (!) 115/59 (!) 109/52  Pulse: 86 77  Resp: 16 18  Temp: 36.8 C   SpO2: 100% 100%    Last Pain:  Vitals:   04/15/22 1003  TempSrc:   PainSc: 0-No pain                 Marthenia Rolling

## 2022-04-15 NOTE — Transfer of Care (Signed)
Immediate Anesthesia Transfer of Care Note  Patient: Jasmine Schultz  Procedure(s) Performed: COLONOSCOPY WITH PROPOFOL POLYPECTOMY  Patient Location: PACU  Anesthesia Type:MAC  Level of Consciousness: sedated  Airway & Oxygen Therapy: Patient Spontanous Breathing and Patient connected to face mask oxygen  Post-op Assessment: Report given to RN and Post -op Vital signs reviewed and stable  Post vital signs: Reviewed and stable  Last Vitals:  Vitals Value Taken Time  BP    Temp    Pulse    Resp    SpO2      Last Pain:  Vitals:   04/15/22 0906  TempSrc: Oral  PainSc:          Complications: No notable events documented.

## 2022-04-15 NOTE — H&P (Signed)
Jasmine Schultz HPI: This 46 year old black female presents to the office for colorectal cancer screening. She has 1-2 BM's per day with no obvious blood or mucus in the stool. She has iron deficiency anemia due to heavy menstrual cycles. Lab work done on 06/23/2021 revealed a hemoglobin of 11.1 gms/dl with normal levels of Iron and Ferritin. She is taking Ferrex once per day.  She has a good appetite and has gained nearly 100 pounds since she was seen in 2003. She denies having any complaints of abdominal pain, nausea, vomiting, acid reflux, dysphagia or odynophagia. She denies having a family history of colon cancer, celiac sprue or IBD. Her mother has lymphocytic colitis. Her last colonoscopy done on 04/17/2002 revealed internal hemorrhoids but the exam was otherwise normal.  Past Medical History:  Diagnosis Date   Anxiety attack    Arthritis    Asthma    diagnosed in march 2019   Bursitis    Chronically dry eyes    GERD (gastroesophageal reflux disease)    IBS (irritable bowel syndrome)    Migraine    Multiple allergies    Sun allergy    Tendonitis     Past Surgical History:  Procedure Laterality Date   BREAST REDUCTION SURGERY     REDUCTION MAMMAPLASTY Bilateral    2002   TUBAL LIGATION      Family History  Problem Relation Age of Onset   Asthma Mother    Hypertension Mother    CAD Other    CVA Other    Aneurysm Other        brain aneurysm multiple family members   Breast cancer Other        grandmother   Sickle cell anemia Other        maternal cousin   Aneurysm Maternal Aunt    Diabetes Maternal Aunt    Diabetes Maternal Uncle    Diabetes Paternal Aunt    Heart disease Paternal Uncle    Diabetes Paternal Uncle    Breast cancer Maternal Grandmother    Diabetes Maternal Grandmother    Heart disease Maternal Grandfather    Aneurysm Maternal Grandfather    Diabetes Maternal Grandfather    Diabetes Paternal Grandmother    Heart disease Paternal Grandfather     Diabetes Paternal Grandfather     Social History:  reports that she has never smoked. She has never used smokeless tobacco. She reports current alcohol use. She reports that she does not use drugs.  Allergies:  Allergies  Allergen Reactions   Chocolate Anaphylaxis    Mild Anaphylaxis   Cocoa Anaphylaxis    Mild Anaphylaxis   Peanut Butter Flavor Anaphylaxis    Feels congested   Seasonal Ic [Cholestatin] Shortness Of Breath    Trees/ pollen   Tomato Anaphylaxis and Rash   Bacitracin-Polymyxin B Other (See Comments)    Band aids cause- dark spots on her skin and itching/swelling in contact area   Other Other (See Comments)    Sun Blister peel off after a month and leaves scars / skin get hot and really swell up Causes Eczema and Psoriasis  Tar- Throat close and Anaphylaxis  Cigarettes Smoke- Throat close and Anaphylaxis    Oysters [Shellfish Allergy] Hives   Sulfa Antibiotics Hives   Balsam Rash    Positive Patch Test 11/11/21   Iodopropynyl Rash    Positive Patch Test 11/11/21   Quaternium-15 Rash    Positive Patch Test 11/11/21    Medications:  Scheduled: Continuous:  No results found for this or any previous visit (from the past 24 hour(s)).   No results found.  ROS:  As stated above in the HPI otherwise negative.  Blood pressure (!) 145/78, pulse 80, temperature 97.7 F (36.5 C), temperature source Oral, resp. rate 17, height '5\' 6"'$  (1.676 m), weight (!) 145.2 kg, SpO2 100 %.    PE: Gen: NAD, Alert and Oriented HEENT:  [place]/AT, EOMI Neck: Supple, no LAD Lungs: CTA Bilaterally CV: RRR without M/G/R ABD: Soft, NTND, +BS Ext: No C/C/E  Assessment/Plan: 1) Screening colonoscopy.  Daron Breeding D 04/15/2022, 9:10 AM

## 2022-04-15 NOTE — Discharge Instructions (Signed)

## 2022-04-15 NOTE — Op Note (Signed)
[facility] Patient Name: Jasmine Schultz Procedure Date: 04/15/2022 MRN: [id number] Attending MD: Carol Ada , MD Date of Birth: [birth year] CSN: [id number] Age: 46 Admit Type: Outpatient Procedure:                Colonoscopy Indications:              Screening for colorectal malignant neoplasm Providers:                Carol Ada, MD, Benay Pillow, RN, Frazier Richards,                            Technician Referring MD:              Medicines:                Propofol per Anesthesia Complications:            No immediate complications. Estimated Blood Loss:     Estimated blood loss: none. Procedure:                Pre-Anesthesia Assessment:                           - Prior to the procedure, a History and Physical                            was performed, and patient medications and                            allergies were reviewed. The patient's tolerance of                            previous anesthesia was also reviewed. The risks                            and benefits of the procedure and the sedation                            options and risks were discussed with the patient.                            All questions were answered, and informed consent                            was obtained. Prior Anticoagulants: The patient has                            taken no previous anticoagulant or antiplatelet                            agents. ASA Grade Assessment: III - A patient with                            severe systemic disease. After reviewing the risks  and benefits, the patient was deemed in                            satisfactory condition to undergo the procedure.                           - Sedation was administered by an anesthesia                            professional. Deep sedation was attained.                           After obtaining informed consent, the colonoscope                            was passed under direct  vision. Throughout the                            procedure, the patient's blood pressure, pulse, and                            oxygen saturations were monitored continuously. The                            CF-HQ190L ([id number] Olympus colonoscope was                            introduced through the anus and advanced to the the                            cecum, identified by appendiceal orifice and                            ileocecal valve. The colonoscopy was performed                            without difficulty. The patient tolerated the                            procedure well. The quality of the bowel                            preparation was evaluated using the BBPS [facility]                            Bowel Preparation Scale) with scores of: Right                            Colon = 3, Transverse Colon = 3 and Left Colon = 3                            (entire mucosa seen well with no residual staining,  small fragments of stool or opaque liquid). The                            total BBPS score equals 9. The ileocecal valve,                            appendiceal orifice, and rectum were photographed. Scope In: 9:34:39 AM Scope Out: 9:49:57 AM Scope Withdrawal Time: 0 hours 12 minutes 27 seconds  Total Procedure Duration: 0 hours 15 minutes 18 seconds  Findings:      Three sessile polyps were found in the transverse colon and cecum. The       polyps were 2 to 5 mm in size. These polyps were removed with a cold       snare. Resection was complete, but the polyp tissue was only partially       retrieved. Impression:               - Three 2 to 5 mm polyps in the transverse colon                            and in the cecum, removed with a cold snare.                            Resected and retrieved. Moderate Sedation:      Not Applicable - Patient had care per Anesthesia. Recommendation:           - Patient has a contact number available for                             emergencies. The signs and symptoms of potential                            delayed complications were discussed with the                            patient. Return to normal activities tomorrow.                            Written discharge instructions were provided to the                            patient.                           - Resume previous diet.                           - Continue present medications.                           - Await pathology results.                           - Repeat colonoscopy in 5 years for surveillance. Procedure Code(s):        --- Professional ---  45385, Colonoscopy, flexible; with removal of                            tumor(s), polyp(s), or other lesion(s) by snare                            technique Diagnosis Code(s):        --- Professional ---                           Z12.11, Encounter for screening for malignant                            neoplasm of colon                           K63.5, Polyp of colon CPT copyright 2019 American Medical Association. All rights reserved. The codes documented in this report are preliminary and upon coder review may  be revised to meet current compliance requirements. Carol Ada, MD Carol Ada, MD 04/15/2022 9:54:24 AM This report has been signed electronically. Number of Addenda: 0

## 2022-04-15 NOTE — Anesthesia Preprocedure Evaluation (Addendum)
Anesthesia Evaluation  Patient identified by MRN, date of birth, ID band Patient awake    Reviewed: Allergy & Precautions, NPO status , Patient's Chart, lab work & pertinent test results  History of Anesthesia Complications Negative for: history of anesthetic complications  Airway Mallampati: II  TM Distance: >3 FB Neck ROM: Full    Dental no notable dental hx.    Pulmonary asthma , sleep apnea ,    Pulmonary exam normal        Cardiovascular negative cardio ROS Normal cardiovascular exam     Neuro/Psych  Headaches, Anxiety    GI/Hepatic Neg liver ROS, GERD  Controlled,  Endo/Other  Morbid obesity (BMI 52)  Renal/GU negative Renal ROS  negative genitourinary   Musculoskeletal  (+) Arthritis ,   Abdominal   Peds  Hematology negative hematology ROS (+)   Anesthesia Other Findings Day of surgery medications reviewed with patient.  Reproductive/Obstetrics negative OB ROS                           Anesthesia Physical Anesthesia Plan  ASA: 3  Anesthesia Plan: MAC   Post-op Pain Management: Minimal or no pain anticipated   Induction:   PONV Risk Score and Plan: 2 and Treatment may vary due to age or medical condition and Propofol infusion  Airway Management Planned: Natural Airway and Nasal Cannula  Additional Equipment: None  Intra-op Plan:   Post-operative Plan:   Informed Consent: I have reviewed the patients History and Physical, chart, labs and discussed the procedure including the risks, benefits and alternatives for the proposed anesthesia with the patient or authorized representative who has indicated his/her understanding and acceptance.       Plan Discussed with: CRNA  Anesthesia Plan Comments:      Anesthesia Quick Evaluation

## 2022-04-18 LAB — SURGICAL PATHOLOGY

## 2022-04-19 ENCOUNTER — Encounter (HOSPITAL_COMMUNITY): Payer: Self-pay | Admitting: Gastroenterology

## 2022-04-25 ENCOUNTER — Other Ambulatory Visit: Payer: Self-pay

## 2022-04-25 DIAGNOSIS — J45909 Unspecified asthma, uncomplicated: Secondary | ICD-10-CM

## 2022-04-25 DIAGNOSIS — K219 Gastro-esophageal reflux disease without esophagitis: Secondary | ICD-10-CM

## 2022-04-25 MED ORDER — PANTOPRAZOLE SODIUM 40 MG PO TBEC: 40.0000 mg | DELAYED_RELEASE_TABLET | Freq: Two times a day (BID) | ORAL | 1 refills | Status: DC

## 2022-04-25 MED ORDER — MONTELUKAST SODIUM 10 MG PO TABS: ORAL_TABLET | ORAL | 1 refills | Status: DC

## 2022-04-25 MED ORDER — FUSION PLUS PO CAPS: ORAL_CAPSULE | ORAL | 1 refills | Status: DC

## 2022-05-02 ENCOUNTER — Emergency Department: Payer: Commercial Managed Care - PPO

## 2022-05-02 ENCOUNTER — Other Ambulatory Visit: Payer: Self-pay

## 2022-05-02 ENCOUNTER — Emergency Department
Admission: EM | Admit: 2022-05-02 | Discharge: 2022-05-02 | Disposition: A | Discharge status: Home / Self Care | Payer: Commercial Managed Care - PPO | Attending: Student in an Organized Health Care Education/Training Program | Admitting: Student in an Organized Health Care Education/Training Program

## 2022-05-02 DIAGNOSIS — J45909 Unspecified asthma, uncomplicated: Secondary | ICD-10-CM | POA: Insufficient documentation

## 2022-05-02 DIAGNOSIS — R0602 Shortness of breath: Secondary | ICD-10-CM | POA: Insufficient documentation

## 2022-05-02 DIAGNOSIS — R0600 Dyspnea, unspecified: Secondary | ICD-10-CM | POA: Diagnosis not present

## 2022-05-02 DIAGNOSIS — F419 Anxiety disorder, unspecified: Secondary | ICD-10-CM

## 2022-05-02 LAB — CBC WITH DIFFERENTIAL/PLATELET
Abs Immature Granulocytes: 0.01 10*3/uL (ref 0.00–0.07)
Basophils Absolute: 0 10*3/uL (ref 0.0–0.1)
Basophils Relative: 1 %
Eosinophils Absolute: 0.2 10*3/uL (ref 0.0–0.5)
Eosinophils Relative: 4 %
HCT: 35.5 % — ABNORMAL LOW (ref 36.0–46.0)
Hemoglobin: 11.2 g/dL — ABNORMAL LOW (ref 12.0–15.0)
Immature Granulocytes: 0 %
Lymphocytes Relative: 32 %
Lymphs Abs: 1.5 10*3/uL (ref 0.7–4.0)
MCH: 27.3 pg (ref 26.0–34.0)
MCHC: 31.5 g/dL (ref 30.0–36.0)
MCV: 86.6 fL (ref 80.0–100.0)
Monocytes Absolute: 0.5 10*3/uL (ref 0.1–1.0)
Monocytes Relative: 10 %
Neutro Abs: 2.5 10*3/uL (ref 1.7–7.7)
Neutrophils Relative %: 53 %
Platelets: 482 10*3/uL — ABNORMAL HIGH (ref 150–400)
RBC: 4.1 MIL/uL (ref 3.87–5.11)
RDW: 12.9 % (ref 11.5–15.5)
WBC: 4.6 10*3/uL (ref 4.0–10.5)
nRBC: 0 % (ref 0.0–0.2)

## 2022-05-02 LAB — COMPREHENSIVE METABOLIC PANEL
ALT: 16 U/L (ref 0–44)
AST: 27 U/L (ref 15–41)
Albumin: 4.2 g/dL (ref 3.5–5.0)
Alkaline Phosphatase: 63 U/L (ref 38–126)
Anion gap: 9 (ref 5–15)
BUN: 11 mg/dL (ref 6–20)
CO2: 21 mmol/L — ABNORMAL LOW (ref 22–32)
Calcium: 9.4 mg/dL (ref 8.9–10.3)
Chloride: 110 mmol/L (ref 98–111)
Creatinine, Ser: 0.81 mg/dL (ref 0.44–1.00)
GFR, Estimated: >60 mL/min (ref >60)
Glucose, Bld: 102 mg/dL — ABNORMAL HIGH (ref 70–99)
Potassium: 3.5 mmol/L (ref 3.5–5.1)
Sodium: 140 mmol/L (ref 135–145)
Total Bilirubin: 0.7 mg/dL (ref 0.3–1.2)
Total Protein: 7.6 g/dL (ref 6.5–8.1)

## 2022-05-02 LAB — POC URINE PREG, ED: Preg Test, Ur: NEGATIVE

## 2022-05-02 MED ORDER — PREDNISONE 20 MG PO TABS: 40.0000 mg | ORAL_TABLET | Freq: Every day | ORAL | 0 refills | Status: AC

## 2022-05-02 MED ORDER — ALPRAZOLAM 0.25 MG PO TABS: 0.2500 mg | ORAL_TABLET | Freq: Three times a day (TID) | ORAL | 0 refills | Status: DC | PRN

## 2022-05-02 MED ORDER — IPRATROPIUM-ALBUTEROL 0.5-2.5 (3) MG/3ML IN SOLN
3.0000 mL | Freq: Once | RESPIRATORY_TRACT | Status: AC
  Administered 2022-05-02: 3 mL via RESPIRATORY_TRACT
  Filled 2022-05-02: qty 3

## 2022-05-02 MED ORDER — DEXAMETHASONE SODIUM PHOSPHATE 10 MG/ML IJ SOLN
10.0000 mg | Freq: Once | INTRAMUSCULAR | Status: AC
Start: 2022-05-02 — End: 2022-05-02
  Administered 2022-05-02: 10 mg via INTRAMUSCULAR
  Filled 2022-05-02: qty 1

## 2022-05-02 MED ORDER — DIAZEPAM 5 MG PO TABS
5.0000 mg | ORAL_TABLET | Freq: Once | ORAL | Status: AC
  Administered 2022-05-02: 5 mg via ORAL
  Filled 2022-05-02: qty 1

## 2022-05-02 NOTE — ED Provider Triage Note (Signed)
Emergency Medicine Provider Triage Evaluation Note  Jasmine Schultz , a 46 y.o. female  was evaluated in triage.  Pt complains of increased SOB and feeling like her throat is closing up. Patient has hx of "faulty vocal cords" and when they start to spasm then she has asthma problems.  Was giving herself  albuterol and felt like throat was getting worse.  Albuterol '5mg'$  given via EMS.    Review of Systems  Positive: Short of breath, cough Negative: -Fever, chills   Physical Exam  BP 129/79 (BP Location: Left Arm)   Pulse 92   Temp 98.3 F (36.8 C) (Oral)   Resp 18   SpO2 100%  Gen:   Awake, no distress  Talking in short sentences.   Resp:  Normal effort  Lungs clear bilat.   MSK:   Moves extremities without difficulty  Other:    Medical Decision Making  Medically screening exam initiated at 9:45 AM.  Appropriate orders placed.  Jasmine Schultz was informed that the remainder of the evaluation will be completed by another provider, this initial triage assessment does not replace that evaluation, and the importance of remaining in the ED until their evaluation is complete.     Johnn Hai, PA-C 05/02/22 [phone]

## 2022-05-02 NOTE — ED Provider Notes (Signed)
[facility] Provider Note    Event Date/Time   First MD Initiated Contact with Patient 05/02/22 1020     (approximate)   History   Shortness of Breath   HPI  Jasmine Schultz is a 46 y.o. 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  Has not had any productive cough.  Took some albuterol at home but did not feel much improvement called EMS.  Was given nebulizer treatment as well as Decadron and feels like she is improving.  Has been admitted and intubated for respiratory failure in the past.  She feels right now like she is improving but does admit to feeling anxious.     Physical Exam   Triage Vital Signs: ED Triage Vitals [05/02/22 0940]  Enc Vitals Group     BP 129/79     Pulse Rate 92     Resp 18     Temp 98.3 F (36.8 C)     Temp Source Oral     SpO2 100 %     Weight      Height      Head Circumference      Peak Flow      Pain Score      Pain Loc      Pain Edu?      Excl. in [place]?     Most recent vital signs: Vitals:   05/02/22 1230 05/02/22 1359  BP: 117/68 124/64  Pulse: 91 88  Resp: 20 20  Temp:    SpO2: 96% 99%     Constitutional: Alert  Eyes: Conjunctivae are normal.  Head: Atraumatic. Nose: No congestion/rhinnorhea. Mouth/Throat: Mucous membranes are moist.   Neck: Painless ROM.  Cardiovascular:   Good peripheral circulation. Respiratory: Normal respiratory effort.  No retractions.  Gastrointestinal: Soft and nontender.  Musculoskeletal:  no deformity Neurologic:  MAE spontaneously. No gross focal neurologic deficits are appreciated.  Skin:  Skin is warm, dry and intact. No rash noted. Psychiatric: Mood and affect are anxious. Speech and behavior are normal.    ED Results / Procedures / Treatments   Labs (all labs  ordered are listed, but only abnormal results are displayed) Labs Reviewed  COMPREHENSIVE METABOLIC PANEL - Abnormal; Notable for the following components:      Result Value   CO2 21 (*)    Glucose, Bld 102 (*)    All other components within normal limits  CBC WITH DIFFERENTIAL/PLATELET - Abnormal; Notable for the following components:   Hemoglobin 11.2 (*)    HCT 35.5 (*)    Platelets 482 (*)    All other components within normal limits  POC URINE PREG, ED     EKG  ED ECG REPORT I, Merlyn Lot, the attending physician, personally viewed and interpreted this ECG.   Date: 05/02/2022  EKG Time: 9:45  Rate: sob  Rhythm: sinus  Axis: normal  Intervals: normal qt  ST&T Change: no stemi, no depressions    RADIOLOGY Please see ED Course for my review and interpretation.  I personally reviewed all radiographic images ordered to evaluate for the above acute complaints and reviewed radiology reports and findings.  These findings were personally discussed with the patient.  Please see medical record for radiology report.    PROCEDURES:  Critical Care performed:  No  Procedures   MEDICATIONS ORDERED IN ED: Medications  ipratropium-albuterol (DUONEB) 0.5-2.5 (3) MG/3ML nebulizer solution 3 mL (3 mLs Nebulization Given 05/02/22 0956)  dexamethasone (DECADRON) injection 10 mg (10 mg Intramuscular Given 05/02/22 0955)  diazepam (VALIUM) tablet 5 mg (5 mg Oral Given 05/02/22 1039)  ipratropium-albuterol (DUONEB) 0.5-2.5 (3) MG/3ML nebulizer solution 3 mL (3 mLs Nebulization Given 05/02/22 1117)     IMPRESSION / MDM / ASSESSMENT AND PLAN / ED COURSE  I reviewed the triage vital signs and the nursing notes.                              Differential diagnosis includes, but is not limited to, Asthma, copd, CHF, pna, ptx, malignancy, Pe, anemia  Presents to the ER for evaluation of symptoms as described above.  My arrival she is with normal respirations does have some  occasional wheeze does have hoarse voice but she feels like this is normal no stridor.  No trismus.  Posterior pharynx is normal.  This presenting complaint could reflect a potentially life-threatening illness therefore the patient will be placed on continuous pulse oximetry and telemetry for monitoring.  Laboratory evaluation will be sent to evaluate for the above complaints.   Patient given Decadron in triage she is very anxious appearing which may be a component of this.  Does have episodes of paroxysmal vocal cord paralysis and spasm therefore will observe lower suspicion for cardiac etiology  Clinical Course as of 05/02/22 1405  Mon May 02, 2022  1146 Patient reassessed.  Feels significantly improved.  We will continue to observe but anticipate she may be okay for discharge home [PR]  1350 Patient reassessed with significant improvement.  She is been stable for several hours here after observation in the ER.  At this point do believe she is appropriate for discharge home. [PR]    Clinical Course User Index [PR] Merlyn Lot, MD    FINAL CLINICAL IMPRESSION(S) / ED DIAGNOSES   Final diagnoses:  Dyspnea, unspecified type     Rx / DC Orders   ED Discharge Orders          Ordered    predniSONE (DELTASONE) 20 MG tablet  Daily        05/02/22 1353    ALPRAZolam (XANAX) 0.25 MG tablet  3 times daily PRN        05/02/22 1353             Note:  This document was prepared using Dragon voice recognition software and may include unintentional dictation errors.    Merlyn Lot, MD 05/02/22 [phone]

## 2022-05-02 NOTE — ED Triage Notes (Addendum)
Pt to ED via [facility] from home. Pt reports she was on the phone and started coughing which triggered her faulty vocal cord to spams and started having increased SOB and feels like her throat is closing. Pt also reports hx anxiety. Pt labored breathing and tachypnea on arrival. Pt states she did her rescue inhaler with no relief and normally uses her EPI pen but did not before EMS arrival.   '5mg'$  albuterol given in route.Marland Kitchen ETCO2 15-18. 100% on RA

## 2022-05-04 ENCOUNTER — Telehealth: Payer: Self-pay | Admitting: Internal Medicine

## 2022-05-04 ENCOUNTER — Telehealth: Payer: Self-pay

## 2022-05-04 NOTE — Telephone Encounter (Signed)
Spoke to patient.  She stated that she had a recent ED visit for SOB and vocal cord issues. Her PCP recommended f/u with  our office.  First available 08/03/2022 at 10:00. Offered sooner appt in [place] and patient declined at this time.  She stated that she would call back for sooner appt if sx worsen or fail to improve. Nothing further needed.

## 2022-05-04 NOTE — Telephone Encounter (Signed)
Transition Care Management Follow-up Telephone Call Date of discharge and from where: 04/29/2022  How have you been since you were released from the hospital? Pt states she is doing better, she does not need an appointment with PCP at the moment. She also reports not having an appointment with pulmonologist coming up, she knows how to go about contacting specialist. She will try to do so soon.  Any questions or concerns? No  Items Reviewed: Did the pt receive and understand the discharge instructions provided? Yes  Medications obtained and verified? Yes  Other? Yes  Any new allergies since your discharge? No  Dietary orders reviewed? Yes Do you have support at home? Yes   Home Care and Equipment/Supplies: Were home health services ordered? no If so, what is the name of the agency? N/a  Has the agency set up a time to come to the patient's home? no Were any new equipment or medical supplies ordered?  No What is the name of the medical supply agency? N/a Were you able to get the supplies/equipment? no Do you have any questions related to the use of the equipment or supplies? No  Functional Questionnaire: (I = Independent and D = Dependent) ADLs: i  Bathing/Dressing- i  Meal Prep- i  Eating- i  Maintaining continence- i  Transferring/Ambulation- i  Managing Meds- i  Follow up appointments reviewed:  PCP Hospital f/u appt confirmed? no Scheduled to see n/a on n/a @ n/a. [facility] f/u appt confirmed? No  Scheduled to see n/a on n/a @ n/a. Are transportation arrangements needed? No  If their condition worsens, is the pt aware to call PCP or go to the Emergency Dept.? Yes Was the patient provided with contact information for the PCP's office or ED? Yes Was to pt encouraged to call back with questions or concerns? Yes

## 2022-05-05 ENCOUNTER — Telehealth: Payer: Self-pay

## 2022-05-05 NOTE — Telephone Encounter (Signed)
Returned called to patient. Lvm.

## 2022-06-30 ENCOUNTER — Other Ambulatory Visit (HOSPITAL_COMMUNITY)
Admission: RE | Admit: 2022-06-30 | Discharge: 2022-06-30 | Disposition: A | Discharge status: Home / Self Care | Payer: Commercial Managed Care - PPO | Source: Ambulatory Visit | Attending: Nurse Practitioner | Admitting: Nurse Practitioner

## 2022-06-30 ENCOUNTER — Encounter: Payer: Self-pay | Admitting: Nurse Practitioner

## 2022-06-30 ENCOUNTER — Ambulatory Visit (INDEPENDENT_AMBULATORY_CARE_PROVIDER_SITE_OTHER): Payer: Commercial Managed Care - PPO | Admitting: Nurse Practitioner

## 2022-06-30 VITALS — BP 128/68 | HR 79 | Temp 98.5°F | Ht 66.0 in | Wt 324.2 lb

## 2022-06-30 DIAGNOSIS — E782 Mixed hyperlipidemia: Secondary | ICD-10-CM

## 2022-06-30 DIAGNOSIS — D509 Iron deficiency anemia, unspecified: Secondary | ICD-10-CM

## 2022-06-30 DIAGNOSIS — Z79899 Other long term (current) drug therapy: Secondary | ICD-10-CM

## 2022-06-30 DIAGNOSIS — Z0000 Encounter for general adult medical examination without abnormal findings: Secondary | ICD-10-CM | POA: Diagnosis not present

## 2022-06-30 DIAGNOSIS — Z124 Encounter for screening for malignant neoplasm of cervix: Secondary | ICD-10-CM | POA: Insufficient documentation

## 2022-06-30 DIAGNOSIS — Z6841 Body Mass Index (BMI) 40.0 and over, adult: Secondary | ICD-10-CM

## 2022-06-30 DIAGNOSIS — Z86711 Personal history of pulmonary embolism: Secondary | ICD-10-CM

## 2022-06-30 DIAGNOSIS — F419 Anxiety disorder, unspecified: Secondary | ICD-10-CM | POA: Diagnosis not present

## 2022-06-30 DIAGNOSIS — R7309 Other abnormal glucose: Secondary | ICD-10-CM

## 2022-06-30 DIAGNOSIS — L409 Psoriasis, unspecified: Secondary | ICD-10-CM

## 2022-06-30 DIAGNOSIS — Z2821 Immunization not carried out because of patient refusal: Secondary | ICD-10-CM

## 2022-06-30 DIAGNOSIS — M359 Systemic involvement of connective tissue, unspecified: Secondary | ICD-10-CM

## 2022-06-30 DIAGNOSIS — Z9989 Dependence on other enabling machines and devices: Secondary | ICD-10-CM

## 2022-06-30 DIAGNOSIS — G4733 Obstructive sleep apnea (adult) (pediatric): Secondary | ICD-10-CM

## 2022-06-30 MED ORDER — ACCRUFER 30 MG PO CAPS
1.0000 | ORAL_CAPSULE | Freq: Every day | ORAL | 5 refills | Status: DC
Start: 2022-06-30 — End: 2022-06-30

## 2022-06-30 MED ORDER — ACCRUFER 30 MG PO CAPS
1.0000 | ORAL_CAPSULE | Freq: Every day | ORAL | 1 refills | Status: DC
Start: 2022-06-30 — End: 2022-09-27

## 2022-06-30 NOTE — Patient Instructions (Signed)

## 2022-06-30 NOTE — Progress Notes (Signed)
I,Tianna Badgett,acting as a Education administrator for [employer], FNP.,have documented all relevant documentation on the behalf of [name], FNP,as directed by  [name], FNP while in the presence of [name], [place].  Subjective:     Patient ID: Jasmine Schultz , female    DOB: [birth year] , 46 y.o.   MRN: [id number]   Chief Complaint  Patient presents with   Annual Exam    HPI  Patient is here for HM. She is followed by [facility] for her GYN care. She has no concerns at this time.   She has seen Dr. Avon Gully on Monday - no changes. She would like to have her labs sent from today  She seen Dr. Jenell Milliner Pulmonology February 2023 She seen Ophthalmology earlier this year.   Wt Readings from Last 3 Encounters: 06/30/22 : (!) 324 lb 3.2 oz (147.1 kg) 05/02/22 : (!) 319 lb 14.2 oz (145.1 kg) 04/15/22 : (!) 320 lb (145.2 kg)     Past Medical History:  Diagnosis Date   Anxiety attack    Arthritis    Asthma    diagnosed in march 2019   Bursitis    Chronically dry eyes    GERD (gastroesophageal reflux disease)    IBS (irritable bowel syndrome)    Migraine    Multiple allergies    Sun allergy    Tendonitis      Family History  Problem Relation Age of Onset   Asthma Mother    Hypertension Mother    CAD Other    CVA Other    Aneurysm Other        brain aneurysm multiple family members   Breast cancer Other        grandmother   Sickle cell anemia Other        maternal cousin   Aneurysm Maternal Aunt    Diabetes Maternal Aunt    Diabetes Maternal Uncle    Diabetes Paternal Aunt    Heart disease Paternal Uncle    Diabetes Paternal Uncle    Breast cancer Maternal Grandmother    Diabetes Maternal Grandmother    Heart disease Maternal Grandfather    Aneurysm Maternal Grandfather    Diabetes Maternal Grandfather    Diabetes Paternal Grandmother    Heart disease Paternal Grandfather    Diabetes Paternal Grandfather      Current Outpatient Medications:    albuterol  (VENTOLIN HFA) 108 (90 Base) MCG/ACT inhaler, Inhale 2 puffs into the lungs every 6 (six) hours as needed for wheezing or shortness of breath., Disp: 18 g, Rfl: 10   ALPRAZolam (XANAX) 0.25 MG tablet, Take 1 tablet (0.25 mg total) by mouth 3 (three) times daily as needed for anxiety., Disp: 6 tablet, Rfl: 0   Ascorbic Acid (VITAMIN C-ROSE HIPS PO), Take 2,000 mg by mouth daily. With Zinc, Disp: , Rfl:    benzonatate (TESSALON) 100 MG capsule, Take 100 mg by mouth 3 (three) times daily as needed for cough., Disp: , Rfl:    budesonide-formoterol (SYMBICORT) 160-4.5 MCG/ACT inhaler, Inhale 2 puffs into the lungs 2 (two) times daily. (Patient taking differently: Inhale 2 puffs into the lungs 2 (two) times daily. Asthma), Disp: 3 each, Rfl: 2   budesonide-formoterol (SYMBICORT) 80-4.5 MCG/ACT inhaler, Inhale 2 puffs into the lungs in the morning and at bedtime. (Patient taking differently: Inhale 2 puffs into the lungs daily as needed (use if 160-4.5 is to strong /Asthma).), Disp: 3 each, Rfl: 2   busPIRone (BUSPAR) 5  MG tablet, Take 1 tablet (5 mg total) by mouth daily. (Patient taking differently: Take 5 mg by mouth daily as needed (anxiety).), Disp: 90 tablet, Rfl: 1   Cholecalciferol (VITAMIN D-3) 125 MCG (5000 UT) TABS, Take 15,000 Units by mouth daily., Disp: , Rfl:    clobetasol cream (TEMOVATE) 0.17 %, Apply 1 application  topically daily as needed (Dermatitis/ Eczema)., Disp: , Rfl: 3   EPINEPHrine 0.3 mg/0.3 mL IJ SOAJ injection, Inject 0.3 mg into the muscle as needed for anaphylaxis., Disp: 1 each, Rfl: 5   fluticasone (FLONASE) 50 MCG/ACT nasal spray, Place 2 sprays into both nostrils 2 (two) times daily., Disp: , Rfl:    hydroxychloroquine (PLAQUENIL) 200 MG tablet, Take 200 mg by mouth 2 (two) times daily., Disp: , Rfl:    levocetirizine (XYZAL) 5 MG tablet, Take 1 tablet (5 mg total) by mouth every evening., Disp: 30 tablet, Rfl: 10   minoxidil (ROGAINE) 2 % external solution, Apply 1  Application topically 2 (two) times daily., Disp: , Rfl:    montelukast (SINGULAIR) 10 MG tablet, TAKE 1 TABLET(10 MG) BY MOUTH AT BEDTIME, Disp: 90 tablet, Rfl: 1   Multiple Vitamin (MULTIVITAMIN ADULT PO), Take 1 tablet by mouth daily at 6 (six) AM. Vita fusion/Woman's, Disp: , Rfl:    ondansetron (ZOFRAN ODT) 4 MG disintegrating tablet, Take 1 tablet (4 mg total) by mouth every 8 (eight) hours as needed., Disp: 20 tablet, Rfl: 0   pantoprazole (PROTONIX) 40 MG tablet, Take 1 tablet (40 mg total) by mouth 2 (two) times daily., Disp: 180 tablet, Rfl: 1   Polyethyl Glycol-Propyl Glycol (SYSTANE OP), Place 2 drops into both ears daily as needed (Cronic Dry eye)., Disp: , Rfl:    triamcinolone ointment (KENALOG) 0.1 %, Apply 1 Application topically 2 (two) times daily as needed (dermatitis/ Eczema)., Disp: , Rfl:    Ferric Maltol (ACCRUFER) 30 MG CAPS, Take 1 tablet by mouth daily., Disp: 90 capsule, Rfl: 1   Allergies  Allergen Reactions   Chocolate Anaphylaxis    Mild Anaphylaxis   Cocoa Anaphylaxis    Mild Anaphylaxis   Peanut Butter Flavor Anaphylaxis    Feels congested   Seasonal Ic [Octacosanol] Shortness Of Breath    Trees/ pollen   Tomato Anaphylaxis and Rash   Bacitracin-Polymyxin B Other (See Comments)    Band aids cause- dark spots on her skin and itching/swelling in contact area   Other Other (See Comments)    Sun Blister peel off after a month and leaves scars / skin get hot and really swell up Causes Eczema and Psoriasis  Tar- Throat close and Anaphylaxis  Cigarettes Smoke- Throat close and Anaphylaxis    Oysters [Shellfish Allergy] Hives   Sulfa Antibiotics Hives   Balsam Rash    Positive Patch Test 11/11/21   Iodopropynyl Rash    Positive Patch Test 11/11/21   Quaternium-15 Rash    Positive Patch Test 11/11/21     The patient states she uses tubal ligation for birth control.  Patient's last menstrual period was 06/14/2022.  Negative for Dysmenorrhea and Negative for  Menorrhagia. Negative for: breast discharge, breast lump(s), breast pain and breast self exam. Associated symptoms include abnormal vaginal bleeding. Pertinent negatives include abnormal bleeding (hematology), anxiety, decreased libido, depression, difficulty falling sleep, dyspareunia, history of infertility, nocturia, sexual dysfunction, sleep disturbances, urinary incontinence, urinary urgency, vaginal discharge and vaginal itching. Diet regular - she has been cutting back on certain foods due to allergies. She "just don't  eat".  She has such allergies to many foods so she stopped eating certain foods. She is unable to eat too many fruits and vegetables due severe acid reflux, has caused her problems with her vocal cords. She has been to a dietitian recently. The patient states her exercise level is moderate with 16,000 steps a day, has stationary and recumbent bike, weights, total gym.   The patient's tobacco use is:  Social History   Tobacco Use  Smoking Status Never  Smokeless Tobacco Never   She has been exposed to passive smoke. The patient's alcohol use is:  Social History   Substance and Sexual Activity  Alcohol Use Yes   Alcohol/week: 0.0 standard drinks of alcohol   Comment: rarley   Additional information: Last pap more than 3 years, next one scheduled for 06/30/2022.    Review of Systems  Constitutional: Negative.   HENT: Negative.    Eyes: Negative.   Respiratory: Negative.    Cardiovascular: Negative.   Gastrointestinal: Negative.   Endocrine: Negative.   Genitourinary: Negative.   Musculoskeletal: Negative.   Skin: Negative.   Allergic/Immunologic: Negative.   Neurological: Negative.   Hematological: Negative.   Psychiatric/Behavioral: Negative.       Today's Vitals   06/30/22 1057  BP: 128/68  Pulse: 79  Temp: 98.5 F (36.9 C)  TempSrc: Oral  Weight: (!) 324 lb 3.2 oz (147.1 kg)  Height: _0  (1.676 m)   Body mass index is 52.33 kg/m.  Wt Readings  from Last 3 Encounters:  06/30/22 (!) 324 lb 3.2 oz (147.1 kg)  05/02/22 (!) 319 lb 14.2 oz (145.1 kg)  04/15/22 (!) 320 lb (145.2 kg)    Objective:  Physical Exam Vitals reviewed.  Constitutional:      General: She is not in acute distress.    Appearance: Normal appearance. She is well-developed. She is obese.  HENT:     Head: Normocephalic and atraumatic.     Right Ear: Hearing, tympanic membrane, ear canal and external ear normal. There is no impacted cerumen.     Left Ear: Hearing, tympanic membrane, ear canal and external ear normal. There is no impacted cerumen.     Nose:     Comments: Deferred - masked    Mouth/Throat:     Comments: Deferred - masked Eyes:     General: Lids are normal.     Extraocular Movements: Extraocular movements intact.     Conjunctiva/sclera: Conjunctivae normal.     Pupils: Pupils are equal, round, and reactive to light.     Funduscopic exam:    Right eye: No papilledema.        Left eye: No papilledema.  Neck:     Thyroid: No thyroid mass.     Vascular: No carotid bruit.  Cardiovascular:     Rate and Rhythm: Normal rate and regular rhythm.     Pulses: Normal pulses.     Heart sounds: Normal heart sounds. No murmur heard. Pulmonary:     Effort: Pulmonary effort is normal. No respiratory distress.     Breath sounds: Normal breath sounds. No wheezing.  Chest:     Chest wall: No mass.  Breasts:    Tanner Score is 5.     Right: Normal. No mass or tenderness.     Left: Normal. No mass or tenderness.  Abdominal:     General: Abdomen is flat. Bowel sounds are normal. There is no distension.     Palpations: Abdomen is soft.  Tenderness: There is no abdominal tenderness.  Genitourinary:    Rectum: Guaiac result negative.  Musculoskeletal:        General: No swelling. Normal range of motion.     Cervical back: Full passive range of motion without pain, normal range of motion and neck supple.     Right lower leg: No edema.     Left lower  leg: No edema.  Lymphadenopathy:     Upper Body:     Right upper body: No supraclavicular, axillary or pectoral adenopathy.     Left upper body: No supraclavicular, axillary or pectoral adenopathy.  Skin:    General: Skin is warm and dry.     Capillary Refill: Capillary refill takes less than 2 seconds.     Comments: Left foot lump and right wrist lump, alopecia  Neurological:     General: No focal deficit present.     Mental Status: She is alert and oriented to person, place, and time.     Cranial Nerves: No cranial nerve deficit.     Sensory: No sensory deficit.  Psychiatric:        Mood and Affect: Mood normal.        Behavior: Behavior normal.        Thought Content: Thought content normal.        Judgment: Judgment normal.         Assessment And Plan:     1. Encounter for annual physical exam Behavior modifications discussed and diet history reviewed.   Pt will continue to exercise regularly and modify diet with low GI, plant based foods and decrease intake of processed foods.  Recommend intake of daily multivitamin, Vitamin D, and calcium.  Recommend mammogram and colonoscopy for preventive screenings, as well as recommend immunizations that include influenza, TDAP  2. Encounter for Papanicolaou smear of cervix - Cytology -Pap Smear  3. Class 3 severe obesity due to excess calories without serious comorbidity with body mass index (BMI) of 50.0 to 59.9 in adult Jasmine Schultz) Chronic Discussed healthy diet and regular exercise options  Encouraged to exercise at least 150 minutes per week with 2 days of strength training She is encouraged to strive for BMI less than 30 to decrease cardiac risk.   4. History of pulmonary embolism  5. Anxiety Comments: Continue buspirone working well.   6. Mixed hyperlipidemia Comments: Diet controlled, continue low fat diet.  - CMP14+EGFR - Lipid panel  7. Iron deficiency anemia, unspecified iron deficiency anemia type Comments: Will  check levels, will send rx for accufer her fusion plus is expensive.  - Iron, TIBC and Ferritin Panel  8. Abnormal glucose Stable continue diet control and focus on weight loss. - Hemoglobin A1c  9. OSA on CPAP Comments: Continues to use regularly with good benefit, does not sleep without it.  10. Connective tissue disease ([place]) Comments: Continue follow up with Dr. Dossie Der, handicap placard form completed due to the joint pain, intermittent shortness of breath due to her lung history  11. Psoriasis Comments: Continue follow up with Dermatology and Rheumatology  12. Other long term (current) drug therapy - CBC  13. COVID-19 vaccination declined Comments: Pulmonology recommends to not get at this time due to her respiratory history.    Patient was given opportunity to ask questions. Patient verbalized understanding of the plan and was able to repeat key elements of the plan. All questions were answered to their satisfaction.   [name], FNP   I, [name], FNP, have  reviewed all documentation for this visit. The documentation on 06/30/22 for the exam, diagnosis, procedures, and orders are all accurate and complete.   THE PATIENT IS ENCOURAGED TO PRACTICE SOCIAL DISTANCING DUE TO THE COVID-19 PANDEMIC.

## 2022-07-01 LAB — CBC
Hematocrit: 34.1 % (ref 34.0–46.6)
Hemoglobin: 10.9 g/dL — ABNORMAL LOW (ref 11.1–15.9)
MCH: 28 pg (ref 26.6–33.0)
MCHC: 32 g/dL (ref 31.5–35.7)
MCV: 88 fL (ref 79–97)
Platelets: 402 10*3/uL (ref 150–450)
RBC: 3.89 x10E6/uL (ref 3.77–5.28)
RDW: 12.8 % (ref 11.7–15.4)
WBC: 5.1 10*3/uL (ref 3.4–10.8)

## 2022-07-01 LAB — CMP14+EGFR
ALT: 15 IU/L (ref 0–32)
AST: 39 IU/L (ref 0–40)
Albumin/Globulin Ratio: 1.7 (ref 1.2–2.2)
Albumin: 4.5 g/dL (ref 3.9–4.9)
Alkaline Phosphatase: 62 IU/L (ref 44–121)
BUN/Creatinine Ratio: 9 (ref 9–23)
BUN: 7 mg/dL (ref 6–24)
Bilirubin Total: <0.2 mg/dL (ref 0.0–1.2)
CO2: 16 mmol/L — ABNORMAL LOW (ref 20–29)
Calcium: 9.6 mg/dL (ref 8.7–10.2)
Chloride: 102 mmol/L (ref 96–106)
Creatinine, Ser: 0.8 mg/dL (ref 0.57–1.00)
Globulin, Total: 2.7 g/dL (ref 1.5–4.5)
Glucose: 70 mg/dL (ref 70–99)
Potassium: 5.8 mmol/L — ABNORMAL HIGH (ref 3.5–5.2)
Sodium: 139 mmol/L (ref 134–144)
Total Protein: 7.2 g/dL (ref 6.0–8.5)
eGFR: 91 mL/min/{1.73_m2} (ref >59)

## 2022-07-01 LAB — HEMOGLOBIN A1C
Est. average glucose Bld gHb Est-mCnc: 103 mg/dL
Hgb A1c MFr Bld: 5.2 % (ref 4.8–5.6)

## 2022-07-01 LAB — LIPID PANEL
Chol/HDL Ratio: 3.3 ratio (ref 0.0–4.4)
Cholesterol, Total: 234 mg/dL — ABNORMAL HIGH (ref 100–199)
HDL: 72 mg/dL (ref >39)
LDL Chol Calc (NIH): 150 mg/dL — ABNORMAL HIGH (ref 0–99)
Triglycerides: 72 mg/dL (ref 0–149)
VLDL Cholesterol Cal: 12 mg/dL (ref 5–40)

## 2022-07-01 LAB — IRON,TIBC AND FERRITIN PANEL
Ferritin: 25 ng/mL (ref 15–150)
Iron Saturation: 9 % — CL (ref 15–55)
Iron: 36 ug/dL (ref 27–159)
Total Iron Binding Capacity: 396 ug/dL (ref 250–450)
UIBC: 360 ug/dL (ref 131–425)

## 2022-07-08 LAB — CYTOLOGY - PAP
Adequacy: ABSENT
Diagnosis: NEGATIVE

## 2022-08-02 ENCOUNTER — Telehealth: Payer: Self-pay

## 2022-08-02 NOTE — Telephone Encounter (Signed)
Lm for patient to request that she bring SD card to 08/03/2022 visit.

## 2022-08-03 ENCOUNTER — Ambulatory Visit (INDEPENDENT_AMBULATORY_CARE_PROVIDER_SITE_OTHER): Payer: Commercial Managed Care - PPO | Admitting: Primary Care

## 2022-08-03 ENCOUNTER — Encounter: Payer: Self-pay | Admitting: Primary Care

## 2022-08-03 DIAGNOSIS — J40 Bronchitis, not specified as acute or chronic: Secondary | ICD-10-CM

## 2022-08-03 DIAGNOSIS — G4733 Obstructive sleep apnea (adult) (pediatric): Secondary | ICD-10-CM

## 2022-08-03 DIAGNOSIS — J45909 Unspecified asthma, uncomplicated: Secondary | ICD-10-CM

## 2022-08-03 DIAGNOSIS — J329 Chronic sinusitis, unspecified: Secondary | ICD-10-CM | POA: Diagnosis not present

## 2022-08-03 MED ORDER — AMOXICILLIN-POT CLAVULANATE 875-125 MG PO TABS: 1.0000 | ORAL_TABLET | Freq: Two times a day (BID) | ORAL | 0 refills | Status: DC

## 2022-08-03 NOTE — Patient Instructions (Addendum)
Recommendations: Continue Symbicort 160 - take two puffs morning and evening Continue Albuterol 2 puffs every 4-6 hours as needed for breakthrough sob/wheezing Start Mucinex-D two tablets morning and evening x 5-7 days (take with full glass of water) Continue nasal spray Continue to wear CPAP every night for 4-6 hours or longer Continue weight loss efforts and focus on side sleeping position or elevate head of bed while sleeping Call if you need prednisone    Rx: Augmentin 1 tab twice daily x 7 days   Follow-up: 6 months with Jasmine Schultz or sooner if needed

## 2022-08-03 NOTE — Progress Notes (Signed)
$'@Patient'y$  ID: Jasmine Schultz, female    DOB: 1975-11-11, 46 y.o.   MRN: [id number]  Chief Complaint  Patient presents with   Follow-up    Wearing cpap avg 10-11hr nightly-pressure and mask is okay.     Referring provider: [name], FNP  PT PROFILE: 47 y.o. female never smoker admitted to [facility] 8/8-8/14/19 with status asthmaticus. Intubated in ED and briefly required epinephrine an ketamine infusions. Self extubated on 8/11.   SYNOPSIS  She remains on Symbicort for asthma.  She also is on montelukast and levocetirizine.  She rarely uses albuterol.  She continues to have problems with hoarseness which "comes and goes".  She cannot identify any factors that exacerbate her hoarseness.  She denies CP, fever, purulent sputum, hemoptysis, LE edema and calf tenderness. Persistent, intermittent hoarseness raises concern for possibility of vocal cord dysfunction syndrome contributing to her "asthma" symptoms.  VCDS can be exacerbated by inhaled corticosteroids  CC Follow-up asthma Follow-up OSA  HPI Patient has a diagnosis of asthma Previous ventilatory support in August 2019 Patient continues to take Symbicort and albuterol as needed Patient is prednisone and antibiotic responsive  Previous diagnosis of severe sleep apnea AHI of 78 Will need compliance report Patient states she continues to use her CPAP as prescribed Patient has excellent compliance report reviewed in detail   +exposure to carts, chest heaviness and wheezing Advised to avoid cats  No exacerbation at this time No evidence of heart failure at this time No evidence or signs of infection at this time No respiratory distress No fevers, chills, nausea, vomiting, diarrhea No evidence of lower extremity edema No evidence hemoptysis    08/03/2022 - Interim  Patient presents today for follow-up/OSA and asthma. Patient had sleep study in January 2020 that showed severe OSA, AHI 78.5/hour.   She is doing extremely  well. She is compliant with CPAP and reports benefit from use. 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 She is getting 8 hours of sleep. No daytime fatigue.   This has been a difficult week for her breathing wise. She reports sinus and chest congestion for the last week. Associated headaches. Coughing up some phlegm, mostly clear but occasionally pale green.  She is using nasal spray. Not currently taking any expectorants. She has both strengths Symbicort 80 and 160, she will alternative between the two doses based on her symptoms. Majority of the time she is using Symbicort 160.   Airview download 07/04/22-08/02/22 Usage 30/30 days; 97% > 4 hours Average usage 8 hours 10 mins Pressure 5-18cm h20 (13.3cm h20-95%) Airleaks 3.2L/min AHI 0.1    DATA: 05/19/18 CTA chest: no acute findings except mild dependent atelectasis 08/07/2018 PFTs: FVC: 2.92 > 2.98 L (91 > 93 %pred), FEV1: 2.30 > 2.47 L (88 > 95% pred), FEV1/FVC: 79%, TLC: 4.5 to L (86 %pred), DLCO 78 %pred 08/2018 IgE levels 135, EOS 400    Allergies  Allergen Reactions   Chocolate Anaphylaxis    Mild Anaphylaxis   Cocoa Anaphylaxis    Mild Anaphylaxis   Peanut Butter Flavor Anaphylaxis    Feels congested   Seasonal Ic [Octacosanol] Shortness Of Breath    Trees/ pollen   Tomato Anaphylaxis and Rash   Bacitracin-Polymyxin B Other (See Comments)    Band aids cause- dark spots on her skin and itching/swelling in contact area   Other Other (See Comments)    Sun Blister peel off after a month and leaves scars / skin  get hot and really swell up Causes Eczema and Psoriasis  Tar- Throat close and Anaphylaxis  Cigarettes Smoke- Throat close and Anaphylaxis    Oysters [Shellfish Allergy] Hives   Sulfa Antibiotics Hives   Balsam Rash    Positive Patch Test 11/11/21   Iodopropynyl Rash    Positive Patch Test 11/11/21   Quaternium-15 Rash    Positive Patch Test 11/11/21    Immunization History  Administered  Date(s) Administered   Influenza,inj,Quad PF,6+ Mos 08/21/2018   Influenza-Unspecified 08/16/2021   Tdap 12/10/2010, 06/23/2021    Past Medical History:  Diagnosis Date   Anxiety attack    Arthritis    Asthma    diagnosed in march 2019   Bursitis    Chronically dry eyes    GERD (gastroesophageal reflux disease)    IBS (irritable bowel syndrome)    Migraine    Multiple allergies    Sun allergy    Tendonitis     Tobacco History: Social History   Tobacco Use  Smoking Status Never  Smokeless Tobacco Never   Counseling given: Not Answered   Outpatient Medications Prior to Visit  Medication Sig Dispense Refill   albuterol (VENTOLIN HFA) 108 (90 Base) MCG/ACT inhaler Inhale 2 puffs into the lungs every 6 (six) hours as needed for wheezing or shortness of breath. 18 g 10   ALPRAZolam (XANAX) 0.25 MG tablet Take 1 tablet (0.25 mg total) by mouth 3 (three) times daily as needed for anxiety. 6 tablet 0   Ascorbic Acid (VITAMIN C-ROSE HIPS PO) Take 2,000 mg by mouth daily. With Zinc     benzonatate (TESSALON) 100 MG capsule Take 100 mg by mouth 3 (three) times daily as needed for cough.     budesonide-formoterol (SYMBICORT) 160-4.5 MCG/ACT inhaler Inhale 2 puffs into the lungs 2 (two) times daily. (Patient taking differently: Inhale 2 puffs into the lungs 2 (two) times daily. Asthma) 3 each 2   budesonide-formoterol (SYMBICORT) 80-4.5 MCG/ACT inhaler Inhale 2 puffs into the lungs in the morning and at bedtime. (Patient taking differently: Inhale 2 puffs into the lungs daily as needed (use if 160-4.5 is to strong /Asthma).) 3 each 2   busPIRone (BUSPAR) 5 MG tablet Take 1 tablet (5 mg total) by mouth daily. (Patient taking differently: Take 5 mg by mouth daily as needed (anxiety).) 90 tablet 1   Cholecalciferol (VITAMIN D-3) 125 MCG (5000 UT) TABS Take 15,000 Units by mouth daily.     clobetasol cream (TEMOVATE) 9.52 % Apply 1 application  topically daily as needed (Dermatitis/ Eczema).   3   EPINEPHrine 0.3 mg/0.3 mL IJ SOAJ injection Inject 0.3 mg into the muscle as needed for anaphylaxis. 1 each 5   Ferric Maltol (ACCRUFER) 30 MG CAPS Take 1 tablet by mouth daily. 90 capsule 1   fluticasone (FLONASE) 50 MCG/ACT nasal spray Place 2 sprays into both nostrils 2 (two) times daily.     hydroxychloroquine (PLAQUENIL) 200 MG tablet Take 200 mg by mouth 2 (two) times daily.     levocetirizine (XYZAL) 5 MG tablet Take 1 tablet (5 mg total) by mouth every evening. 30 tablet 10   minoxidil (ROGAINE) 2 % external solution Apply 1 Application topically 2 (two) times daily.     montelukast (SINGULAIR) 10 MG tablet TAKE 1 TABLET(10 MG) BY MOUTH AT BEDTIME 90 tablet 1   Multiple Vitamin (MULTIVITAMIN ADULT PO) Take 1 tablet by mouth daily at 6 (six) AM. Vita fusion/Woman's     ondansetron (ZOFRAN ODT)  4 MG disintegrating tablet Take 1 tablet (4 mg total) by mouth every 8 (eight) hours as needed. 20 tablet 0   pantoprazole (PROTONIX) 40 MG tablet Take 1 tablet (40 mg total) by mouth 2 (two) times daily. 180 tablet 1   Polyethyl Glycol-Propyl Glycol (SYSTANE OP) Place 2 drops into both ears daily as needed (Cronic Dry eye).     triamcinolone ointment (KENALOG) 0.1 % Apply 1 Application topically 2 (two) times daily as needed (dermatitis/ Eczema).     No facility-administered medications prior to visit.   Review of Systems  Review of Systems  Constitutional: Negative.   HENT:  Positive for postnasal drip.   Respiratory:  Positive for cough.    Physical Exam  BP 126/74 (BP Location: Left Wrist, Cuff Size: Normal)   Pulse 80   Temp 97.9 F (36.6 C) (Temporal)   Ht '5\' 6"'$  (1.676 m)   Wt (!) 320 lb 3.2 oz (145.2 kg)   SpO2 99%   BMI 51.68 kg/m  Physical Exam Constitutional:      Appearance: Normal appearance.  HENT:     Head: Normocephalic and atraumatic.  Cardiovascular:     Rate and Rhythm: Normal rate and regular rhythm.  Pulmonary:     Effort: Pulmonary effort is normal.      Breath sounds: Normal breath sounds. No wheezing, rhonchi or rales.  Musculoskeletal:        General: Normal range of motion.  Skin:    General: Skin is warm and dry.  Neurological:     General: No focal deficit present.     Mental Status: She is alert and oriented to person, place, and time. Mental status is at baseline.  Psychiatric:        Mood and Affect: Mood normal.        Behavior: Behavior normal.        Thought Content: Thought content normal.        Judgment: Judgment normal.      Lab Results:  CBC    Component Value Date/Time   WBC 5.1 06/30/2022 1410   WBC 4.6 05/02/2022 0949   RBC 3.89 06/30/2022 1410   RBC 4.10 05/02/2022 0949   HGB 10.9 (L) 06/30/2022 1410   HCT 34.1 06/30/2022 1410   PLT 402 06/30/2022 1410   MCV 88 06/30/2022 1410   MCH 28.0 06/30/2022 1410   MCH 27.3 05/02/2022 0949   MCHC 32.0 06/30/2022 1410   MCHC 31.5 05/02/2022 0949   RDW 12.8 06/30/2022 1410   LYMPHSABS 1.5 05/02/2022 0949   LYMPHSABS 2.4 10/18/2018 1712   MONOABS 0.5 05/02/2022 0949   EOSABS 0.2 05/02/2022 0949   EOSABS 0.2 10/18/2018 1712   BASOSABS 0.0 05/02/2022 0949   BASOSABS 0.0 10/18/2018 1712    BMET    Component Value Date/Time   NA 139 06/30/2022 1410   K 5.8 (H) 06/30/2022 1410   CL 102 06/30/2022 1410   CO2 16 (L) 06/30/2022 1410   GLUCOSE 70 06/30/2022 1410   GLUCOSE 102 (H) 05/02/2022 0949   BUN 7 06/30/2022 1410   CREATININE 0.80 06/30/2022 1410   CALCIUM 9.6 06/30/2022 1410   GFRNONAA >60 05/02/2022 0949   GFRAA 86 06/25/2020 0000    BNP No results found for: "BNP"  ProBNP No results found for: "PROBNP"  Imaging: No results found.   Assessment & Plan:   OSA on CPAP - Sleep study in January 2020 showed severe OSA, AHI 78.5/hour.  - Patient is 97%  compliant with CPAP use > 4 hours over the last 30 days - Current pressure 5-18cm h20; Residual AHI 0.1 - No changes today - FU in 6 months or sooner if needed   Asthma - Current  URI/bronchitis. Asthma is not exacerbated. Continue Symbicort 122mg two puffs twice daily and prn Albuterol 2 puffs every 4-6 hours for breakthrough sob/wheezing  Sinobronchitis - Patient developed purulent cough last week. Associated sinus congestion and headaches - Sending in Augmentin 1 tab twice daily x 7 days - Continue over the counter steriod nasal spray - Advised to start mucinex-D for 5-7 days   - Call if not better or symptoms worsen    EMartyn Ehrich NP 08/03/2022

## 2022-08-03 NOTE — Assessment & Plan Note (Signed)
-   Patient developed purulent cough last week. Associated sinus congestion and headaches - Sending in Augmentin 1 tab twice daily x 7 days - Continue over the counter steriod nasal spray - Advised to start mucinex-D for 5-7 days   - Call if not better or symptoms worsen

## 2022-08-03 NOTE — Assessment & Plan Note (Addendum)
-   Current URI/bronchitis. Asthma is not exacerbated. Continue Symbicort 125mg two puffs twice daily and prn Albuterol 2 puffs every 4-6 hours for breakthrough sob/wheezing

## 2022-08-03 NOTE — Telephone Encounter (Signed)
Lm x2 for patient.  Will close encounter per office protocol.   

## 2022-08-03 NOTE — Assessment & Plan Note (Signed)
-   Sleep study in January 2020 showed severe OSA, AHI 78.5/hour.  - Patient is 97% compliant with CPAP use > 4 hours over the last 30 days - Current pressure 5-18cm h20; Residual AHI 0.1 - No changes today - FU in 6 months or sooner if needed

## 2022-08-29 ENCOUNTER — Inpatient Hospital Stay: Payer: Commercial Managed Care - PPO | Admitting: Internal Medicine

## 2022-09-06 ENCOUNTER — Other Ambulatory Visit: Payer: Self-pay | Admitting: Nurse Practitioner

## 2022-09-06 DIAGNOSIS — J45909 Unspecified asthma, uncomplicated: Secondary | ICD-10-CM

## 2022-09-25 ENCOUNTER — Ambulatory Visit
Admission: EM | Admit: 2022-09-25 | Discharge: 2022-09-25 | Disposition: A | Discharge status: Home / Self Care | Payer: Commercial Managed Care - PPO | Attending: Emergency Medicine | Admitting: Emergency Medicine

## 2022-09-25 DIAGNOSIS — L089 Local infection of the skin and subcutaneous tissue, unspecified: Secondary | ICD-10-CM | POA: Diagnosis not present

## 2022-09-25 DIAGNOSIS — T148XXA Other injury of unspecified body region, initial encounter: Secondary | ICD-10-CM

## 2022-09-25 DIAGNOSIS — L03119 Cellulitis of unspecified part of limb: Secondary | ICD-10-CM | POA: Diagnosis not present

## 2022-09-25 HISTORY — DX: Systemic lupus erythematosus, unspecified: M32.9

## 2022-09-25 HISTORY — DX: Reserved for concepts with insufficient information to code with codable children: IMO0002

## 2022-09-25 MED ORDER — DOXYCYCLINE HYCLATE 100 MG PO CAPS: 100.0000 mg | ORAL_CAPSULE | Freq: Two times a day (BID) | ORAL | 0 refills | Status: AC

## 2022-09-25 NOTE — ED Triage Notes (Signed)
Patient to Urgent Care with complaints of non-healing wound present to right hand.  Reports she was decoring some apples approx 1 month ago, reports she was removing the core. Was keeping it clean and cleaning with hydrogen peroxide/ alcohol and triple antibiotic ointment. Reports area is now itchy and non-healing. Some drainage. Is allergic to triple antibiotic.  Recently on augmentin for bronchitis.

## 2022-09-25 NOTE — Discharge Instructions (Signed)
Keep your wound clean and dry.  Wash it gently twice a day with soap and water.    Take the antibiotic as directed.   Follow up with your primary care provider if your symptoms are not improving.

## 2022-09-25 NOTE — ED Provider Notes (Signed)
Roderic Palau    CSN: [id number] Arrival date & time: 09/25/22  1232      History   Chief Complaint Chief Complaint  Patient presents with   Wound Check    HPI Jasmine Schultz is a 46 y.o. female.  Patient presents with a laceration on her right palm that occurred 4 weeks ago.  She accidentally cut her hand while she was cutting an apple.  She has been treating this at home with hydrogen peroxide and rubbing alcohol and triple antibiotic ointment.  The wound has split open and occasionally has purulent drainage.  She denies fever, chills, numbness, weakness, or other symptoms.  Last tetanus 2022.  Patient was treated with Augmentin on 08/03/2022 by her pulmonologist for sinobronchitis.   The history is provided by the patient and medical records.    Past Medical History:  Diagnosis Date   Anxiety attack    Arthritis    Asthma    diagnosed in march 2019   Bursitis    Chronically dry eyes    GERD (gastroesophageal reflux disease)    IBS (irritable bowel syndrome)    Lupus (HCC)    Migraine    Multiple allergies    Sun allergy    Tendonitis     Patient Active Problem List   Diagnosis Date Noted   Sinobronchitis 08/03/2022   OSA on CPAP 01/10/2019   Elevated platelet count 12/19/2018   Severe obstructive sleep apnea 11/12/2018   Nocturnal hypoxia 11/12/2018   Snoring 10/18/2018   Abnormal glucose 10/18/2018   Chronic GERD 07/11/2018   History of food allergy 07/09/2018   Asthma 06/27/2018   Allergic reaction 06/27/2018   Anemia, iron deficiency 06/06/2015   Reactive airway disease 06/05/2015   Dyspnea 06/05/2015   Nausea and vomiting 06/05/2015   Anxiety 06/05/2015   Perennial allergic rhinitis 06/05/2015    Past Surgical History:  Procedure Laterality Date   BREAST REDUCTION SURGERY     COLONOSCOPY WITH PROPOFOL N/A 04/15/2022   Procedure: COLONOSCOPY WITH PROPOFOL;  Surgeon: Carol Ada, MD;  Location: [facility];  Service: Gastroenterology;   Laterality: N/A;   POLYPECTOMY  04/15/2022   Procedure: POLYPECTOMY;  Surgeon: Carol Ada, MD;  Location: [facility];  Service: Gastroenterology;;   REDUCTION MAMMAPLASTY Bilateral    2002   TUBAL LIGATION      OB History   No obstetric history on file.      Home Medications    Prior to Admission medications   Medication Sig Start Date End Date Taking? Authorizing Provider  doxycycline (VIBRAMYCIN) 100 MG capsule Take 1 capsule (100 mg total) by mouth 2 (two) times daily for 7 days. 09/25/22 10/02/22 Yes Sharion Balloon, NP  albuterol (VENTOLIN HFA) 108 (90 Base) MCG/ACT inhaler Inhale 2 puffs into the lungs every 6 (six) hours as needed for wheezing or shortness of breath. 11/17/21   Flora Lipps, MD  ALPRAZolam Duanne Moron) 0.25 MG tablet Take 1 tablet (0.25 mg total) by mouth 3 (three) times daily as needed for anxiety. 05/02/22   Merlyn Lot, MD  Ascorbic Acid (VITAMIN C-ROSE HIPS PO) Take 2,000 mg by mouth daily. With Zinc    [provider]  benzonatate (TESSALON) 100 MG capsule Take 100 mg by mouth 3 (three) times daily as needed for cough.    [provider]  budesonide-formoterol (SYMBICORT) 160-4.5 MCG/ACT inhaler Inhale 2 puffs into the lungs 2 (two) times daily. Patient taking differently: Inhale 2 puffs into the lungs 2 (two)  times daily. Asthma 04/01/22   Flora Lipps, MD  budesonide-formoterol (SYMBICORT) 80-4.5 MCG/ACT inhaler Inhale 2 puffs into the lungs in the morning and at bedtime. Patient taking differently: Inhale 2 puffs into the lungs daily as needed (use if 160-4.5 is to strong /Asthma). 04/01/22   Flora Lipps, MD  busPIRone (BUSPAR) 5 MG tablet Take 1 tablet (5 mg total) by mouth daily. Patient taking differently: Take 5 mg by mouth daily as needed (anxiety). 10/18/18   [name], FNP  Cholecalciferol (VITAMIN D-3) 125 MCG (5000 UT) TABS Take 15,000 Units by mouth daily.    [provider]  clobetasol cream (TEMOVATE) 5.42 % Apply 1  application  topically daily as needed (Dermatitis/ Eczema).    [provider]  EPINEPHrine 0.3 mg/0.3 mL IJ SOAJ injection Inject 0.3 mg into the muscle as needed for anaphylaxis. 12/16/20   [name], FNP  Ferric Maltol (ACCRUFER) 30 MG CAPS Take 1 tablet by mouth daily. 06/30/22   [name], FNP  fluticasone (FLONASE) 50 MCG/ACT nasal spray Place 2 sprays into both nostrils 2 (two) times daily.    [provider]  hydroxychloroquine (PLAQUENIL) 200 MG tablet Take 200 mg by mouth 2 (two) times daily.    [provider]  levocetirizine (XYZAL) 5 MG tablet Take 1 tablet (5 mg total) by mouth every evening. 09/13/19   Flora Lipps, MD  minoxidil (ROGAINE) 2 % external solution Apply 1 Application topically 2 (two) times daily.    [provider]  montelukast (SINGULAIR) 10 MG tablet TAKE 1 TABLET BY MOUTH AT  BEDTIME 09/06/22   [name], FNP  Multiple Vitamin (MULTIVITAMIN ADULT PO) Take 1 tablet by mouth daily at 6 (six) AM. Vita fusion/Woman's    [provider]  ondansetron (ZOFRAN ODT) 4 MG disintegrating tablet Take 1 tablet (4 mg total) by mouth every 8 (eight) hours as needed. 12/10/18   Lavonia Drafts, MD  pantoprazole (PROTONIX) 40 MG tablet Take 1 tablet (40 mg total) by mouth 2 (two) times daily. 04/25/22   [name], FNP  Polyethyl Glycol-Propyl Glycol (SYSTANE OP) Place 2 drops into both ears daily as needed (Cronic Dry eye).    [provider]  triamcinolone ointment (KENALOG) 0.1 % Apply 1 Application topically 2 (two) times daily as needed (dermatitis/ Eczema). 03/22/22   [provider]    Family History Family History  Problem Relation Age of Onset   Asthma Mother    Hypertension Mother    CAD Other    CVA Other    Aneurysm Other        brain aneurysm multiple family members   Breast cancer Other        grandmother   Sickle cell anemia Other        maternal cousin   Aneurysm Maternal Aunt     Diabetes Maternal Aunt    Diabetes Maternal Uncle    Diabetes Paternal Aunt    Heart disease Paternal Uncle    Diabetes Paternal Uncle    Breast cancer Maternal Grandmother    Diabetes Maternal Grandmother    Heart disease Maternal Grandfather    Aneurysm Maternal Grandfather    Diabetes Maternal Grandfather    Diabetes Paternal Grandmother    Heart disease Paternal Grandfather    Diabetes Paternal Grandfather     Social History Social History   Tobacco Use   Smoking status: Never   Smokeless tobacco: Never  Vaping Use   Vaping Use: Never used  Substance Use Topics   Alcohol use: Yes    Alcohol/week: 0.0 standard drinks of alcohol    Comment: rarley   Drug use: No     Allergies   Chocolate, Cocoa, Peanut butter flavor, Seasonal ic [octacosanol], Tomato, Bacitracin-polymyxin b, Other, Oysters [shellfish allergy], Sulfa antibiotics, Balsam, Iodopropynyl, and Quaternium-15   Review of Systems Review of Systems  Constitutional:  Negative for chills and fever.  Musculoskeletal:  Negative for arthralgias and joint swelling.  Skin:  Positive for wound. Negative for color change.  Neurological:  Negative for weakness and numbness.  All other systems reviewed and are negative.    Physical Exam Triage Vital Signs ED Triage Vitals [09/25/22 1425]  Enc Vitals Group     BP      Pulse Rate (!) 103     Resp 18     Temp 98.4 F (36.9 C)     Temp src      SpO2 98 %     Weight      Height      Head Circumference      Peak Flow      Pain Score      Pain Loc      Pain Edu?      Excl. in [place]?    No data found.  Updated Vital Signs BP 125/89   Pulse (!) 103   Temp 98.4 F (36.9 C)   Resp 18   LMP 09/24/2022   SpO2 98%   Visual Acuity Right Eye Distance:   Left Eye Distance:   Bilateral Distance:    Right Eye Near:   Left Eye Near:    Bilateral Near:     Physical Exam Vitals and nursing note reviewed.  Constitutional:      General: She is not in acute  distress.    Appearance: She is well-developed. She is not ill-appearing.  HENT:     Mouth/Throat:     Mouth: Mucous membranes are moist.  Cardiovascular:     Rate and Rhythm: Normal rate and regular rhythm.  Pulmonary:     Effort: Pulmonary effort is normal. No respiratory distress.  Musculoskeletal:        General: Normal range of motion.     Cervical back: Neck supple.  Skin:    General: Skin is warm and dry.     Capillary Refill: Capillary refill takes less than 2 seconds.     Findings: Lesion present.     Comments: Wound on right hand.  See picture.   Neurological:     General: No focal deficit present.     Mental Status: She is alert and oriented to person, place, and time.     Sensory: No sensory deficit.     Motor: No weakness.  Psychiatric:        Mood and Affect: Mood normal.        Behavior: Behavior normal.      UC Treatments / Results  Labs (all labs ordered are listed, but only abnormal results are displayed) Labs Reviewed - No data to display  EKG   Radiology No results found.  Procedures Procedures (including critical care time)  Medications Ordered in UC Medications - No data to display  Initial Impression / Assessment and Plan / UC Course  I have reviewed the triage vital signs and the nursing notes.  Pertinent labs & imaging results that were available during my care of the patient were reviewed by me and considered in  my medical decision making (see chart for details).    Infected wound, cellulitis of hand.  Treating with doxycycline.  Wound care instructions discussed.  Education provided on wound care.  Instructed patient to follow up with her PCP if her symptoms are not improving.  She agrees to plan of care.    Final Clinical Impressions(s) / UC Diagnoses   Final diagnoses:  Infected wound  Cellulitis of hand     Discharge Instructions      Keep your wound clean and dry.  Wash it gently twice a day with soap and water.     Take the antibiotic as directed.   Follow up with your primary care provider if your symptoms are not improving.        ED Prescriptions     Medication Sig Dispense Auth. Provider   doxycycline (VIBRAMYCIN) 100 MG capsule Take 1 capsule (100 mg total) by mouth 2 (two) times daily for 7 days. 14 capsule Sharion Balloon, NP      PDMP not reviewed this encounter.   Sharion Balloon, NP 09/25/22 [phone]

## 2022-09-27 ENCOUNTER — Other Ambulatory Visit: Payer: Self-pay

## 2022-09-27 MED ORDER — ACCRUFER 30 MG PO CAPS: 1.0000 | ORAL_CAPSULE | Freq: Every day | ORAL | 1 refills | Status: DC

## 2022-09-28 ENCOUNTER — Other Ambulatory Visit: Payer: Self-pay | Admitting: Nurse Practitioner

## 2022-09-28 DIAGNOSIS — K219 Gastro-esophageal reflux disease without esophagitis: Secondary | ICD-10-CM

## 2022-10-05 ENCOUNTER — Ambulatory Visit: Payer: Commercial Managed Care - PPO | Admitting: Nurse Practitioner

## 2022-10-05 ENCOUNTER — Other Ambulatory Visit: Payer: Self-pay

## 2022-10-05 ENCOUNTER — Encounter: Payer: Self-pay | Admitting: Nurse Practitioner

## 2022-10-05 VITALS — BP 124/72 | HR 88 | Temp 98.1°F | Wt 325.6 lb

## 2022-10-05 DIAGNOSIS — L409 Psoriasis, unspecified: Secondary | ICD-10-CM

## 2022-10-05 DIAGNOSIS — M359 Systemic involvement of connective tissue, unspecified: Secondary | ICD-10-CM

## 2022-10-05 DIAGNOSIS — E782 Mixed hyperlipidemia: Secondary | ICD-10-CM | POA: Diagnosis not present

## 2022-10-05 DIAGNOSIS — J383 Other diseases of vocal cords: Secondary | ICD-10-CM

## 2022-10-05 DIAGNOSIS — Z23 Encounter for immunization: Secondary | ICD-10-CM

## 2022-10-05 DIAGNOSIS — F419 Anxiety disorder, unspecified: Secondary | ICD-10-CM | POA: Diagnosis not present

## 2022-10-05 DIAGNOSIS — Z5189 Encounter for other specified aftercare: Secondary | ICD-10-CM

## 2022-10-05 DIAGNOSIS — J45909 Unspecified asthma, uncomplicated: Secondary | ICD-10-CM

## 2022-10-05 DIAGNOSIS — J4540 Moderate persistent asthma, uncomplicated: Secondary | ICD-10-CM

## 2022-10-05 DIAGNOSIS — Z6841 Body Mass Index (BMI) 40.0 and over, adult: Secondary | ICD-10-CM

## 2022-10-05 MED ORDER — FLUTICASONE PROPIONATE 50 MCG/ACT NA SUSP: NASAL | 1 refills | Status: AC

## 2022-10-05 MED ORDER — EPINEPHRINE 0.3 MG/0.3ML IJ SOAJ: 0.3000 mg | INTRAMUSCULAR | 5 refills | Status: AC | PRN

## 2022-10-05 MED ORDER — ACCRUFER 30 MG PO CAPS: 1.0000 | ORAL_CAPSULE | Freq: Every day | ORAL | 1 refills | Status: DC

## 2022-10-05 MED ORDER — FERRALET 90 90-1 MG PO TABS
1.0000 | ORAL_TABLET | Freq: Every day | ORAL | 5 refills | Status: DC
Start: 2022-10-05 — End: 2022-12-29

## 2022-10-05 MED ORDER — ALBUTEROL SULFATE HFA 108 (90 BASE) MCG/ACT IN AERS: 2.0000 | INHALATION_SPRAY | Freq: Four times a day (QID) | RESPIRATORY_TRACT | 10 refills | Status: AC | PRN

## 2022-10-05 NOTE — Progress Notes (Signed)
I,Jasmine Schultz,acting as a Education administrator for Jasmine Brine, FNP.,have documented all relevant documentation on the behalf of Jasmine Brine, FNP,as directed by  Jasmine Brine, FNP while in the presence of Jasmine Schultz, Jasmine Schultz.    Subjective:     Patient ID: Jasmine Schultz , female    DOB: Oct 11, 1975 , 46 y.o.   MRN: [id number]   Chief Complaint  Patient presents with   Follow-up    HPI  Pt presents today for urgent care f/u after visiting Urgent care on 09/25/2022. Initially happened in November but the site worsened. She has been using gauze and self adhesive wrap. She only keeps the area open when going to bed. She accidentally cut her hand while she was cutting an apple. She states her hand is getting better but it does keep splitting. She washes gently twice a day & keeps it dry & covered.  She did finish doxycycline antibiotic and augmentin for her lungs with the pulmonologist.   She would like to be referred to speech pathologist.  She would like to strengthen her vocal cords. She was diagnosed with vocal cord disorder. When she is emotional her throat closes which triggers her panic attack then asthma. She will choke "out of nowhere". Her pulmonologist wanted her to go to a provider in [place] which she can not afford.   She also reports her aunt recently passing. And when she gets emotional it is hard for her to breath.       Past Medical History:  Diagnosis Date   Anxiety attack    Arthritis    Asthma    diagnosed in march 2019   Bursitis    Chronically dry eyes    GERD (gastroesophageal reflux disease)    IBS (irritable bowel syndrome)    Lupus (HCC)    Migraine    Multiple allergies    Sun allergy    Tendonitis      Family History  Problem Relation Age of Onset   Asthma Mother    Hypertension Mother    CAD Other    CVA Other    Aneurysm Other        brain aneurysm multiple family members   Breast cancer Other        grandmother   Sickle cell anemia Other         maternal cousin   Aneurysm Maternal Aunt    Diabetes Maternal Aunt    Diabetes Maternal Uncle    Diabetes Paternal Aunt    Heart disease Paternal Uncle    Diabetes Paternal Uncle    Breast cancer Maternal Grandmother    Diabetes Maternal Grandmother    Heart disease Maternal Grandfather    Aneurysm Maternal Grandfather    Diabetes Maternal Grandfather    Diabetes Paternal Grandmother    Heart disease Paternal Grandfather    Diabetes Paternal Grandfather      Current Outpatient Medications:    ALPRAZolam (XANAX) 0.25 MG tablet, Take 1 tablet (0.25 mg total) by mouth 3 (three) times daily as needed for anxiety., Disp: 6 tablet, Rfl: 0   Ascorbic Acid (VITAMIN C-ROSE HIPS PO), Take 2,000 mg by mouth daily. With Zinc, Disp: , Rfl:    benzonatate (TESSALON) 100 MG capsule, Take 100 mg by mouth 3 (three) times daily as needed for cough., Disp: , Rfl:    budesonide-formoterol (SYMBICORT) 160-4.5 MCG/ACT inhaler, Inhale 2 puffs into the lungs 2 (two) times daily. (Patient taking differently: Inhale 2 puffs into the lungs  2 (two) times daily. Asthma), Disp: 3 each, Rfl: 2   budesonide-formoterol (SYMBICORT) 80-4.5 MCG/ACT inhaler, Inhale 2 puffs into the lungs in the morning and at bedtime. (Patient taking differently: Inhale 2 puffs into the lungs daily as needed (use if 160-4.5 is to strong /Asthma).), Disp: 3 each, Rfl: 2   busPIRone (BUSPAR) 5 MG tablet, Take 1 tablet (5 mg total) by mouth daily. (Patient taking differently: Take 5 mg by mouth daily as needed (anxiety).), Disp: 90 tablet, Rfl: 1   Cholecalciferol (VITAMIN D-3) 125 MCG (5000 UT) TABS, Take 15,000 Units by mouth daily., Disp: , Rfl:    clobetasol cream (TEMOVATE) 8.52 %, Apply 1 application  topically daily as needed (Dermatitis/ Eczema)., Disp: , Rfl: 3   Fe Cbn-Fe Gluc-FA-B12-C-DSS (FERRALET 90) 90-1 MG TABS, Take 1 tablet by mouth daily., Disp: 30 tablet, Rfl: 5   hydroxychloroquine (PLAQUENIL) 200 MG tablet, Take 200 mg by  mouth 2 (two) times daily., Disp: , Rfl:    levocetirizine (XYZAL) 5 MG tablet, Take 1 tablet (5 mg total) by mouth every evening., Disp: 30 tablet, Rfl: 10   montelukast (SINGULAIR) 10 MG tablet, TAKE 1 TABLET BY MOUTH AT  BEDTIME, Disp: 90 tablet, Rfl: 3   Multiple Vitamin (MULTIVITAMIN ADULT PO), Take 1 tablet by mouth daily at 6 (six) AM. Vita fusion/Woman's, Disp: , Rfl:    ondansetron (ZOFRAN ODT) 4 MG disintegrating tablet, Take 1 tablet (4 mg total) by mouth every 8 (eight) hours as needed., Disp: 20 tablet, Rfl: 0   pantoprazole (PROTONIX) 40 MG tablet, TAKE 1 TABLET BY MOUTH TWICE  DAILY, Disp: 180 tablet, Rfl: 3   Polyethyl Glycol-Propyl Glycol (SYSTANE OP), Place 2 drops into both ears daily as needed (Cronic Dry eye)., Disp: , Rfl:    triamcinolone ointment (KENALOG) 0.1 %, Apply 1 Application topically 2 (two) times daily as needed (dermatitis/ Eczema)., Disp: , Rfl:    albuterol (VENTOLIN HFA) 108 (90 Base) MCG/ACT inhaler, Inhale 2 puffs into the lungs every 6 (six) hours as needed for wheezing or shortness of breath., Disp: 18 g, Rfl: 10   EPINEPHrine 0.3 mg/0.3 mL IJ SOAJ injection, Inject 0.3 mg into the muscle as needed for anaphylaxis., Disp: 1 each, Rfl: 5   fluticasone (FLONASE) 50 MCG/ACT nasal spray, Place 2 sprays into both nostrils 2 (two) times daily., Disp: 18.2 mL, Rfl: 1   minoxidil (ROGAINE) 2 % external solution, Apply 1 Application topically 2 (two) times daily. (Patient not taking: Reported on 10/05/2022), Disp: , Rfl:    Allergies  Allergen Reactions   Chocolate Anaphylaxis    Mild Anaphylaxis   Cocoa Anaphylaxis    Mild Anaphylaxis   Peanut Butter Flavor Anaphylaxis    Feels congested   Seasonal Ic [Octacosanol] Shortness Of Breath    Trees/ pollen   Tomato Anaphylaxis and Rash   Bacitracin-Polymyxin B Other (See Comments)    Band aids cause- dark spots on her skin and itching/swelling in contact area   Other Other (See Comments)    Sun Blister peel  off after a month and leaves scars / skin get hot and really swell up Causes Eczema and Psoriasis  Tar- Throat close and Anaphylaxis  Cigarettes Smoke- Throat close and Anaphylaxis    Oysters [Shellfish Allergy] Hives   Sulfa Antibiotics Hives   Balsam Rash    Positive Patch Test 11/11/21   Iodopropynyl Rash    Positive Patch Test 11/11/21   Quaternium-15 Rash    Positive Patch Test  11/11/21     Review of Systems  Constitutional: Negative.   Respiratory: Negative.    Cardiovascular: Negative.   Neurological: Negative.   Psychiatric/Behavioral: Negative.       Today's Vitals   10/05/22 1558  BP: 124/72  Pulse: 88  Temp: 98.1 F (36.7 C)  SpO2: 98%  Weight: (!) 325 lb 9.6 oz (147.7 kg)   Body mass index is 52.55 kg/m.  Wt Readings from Last 3 Encounters:  10/05/22 (!) 325 lb 9.6 oz (147.7 kg)  08/03/22 (!) 320 lb 3.2 oz (145.2 kg)  06/30/22 (!) 324 lb 3.2 oz (147.1 kg)    Objective:  Physical Exam Vitals reviewed.  Constitutional:      General: She is not in acute distress.    Appearance: Normal appearance. She is obese.  Cardiovascular:     Rate and Rhythm: Normal rate and regular rhythm.     Pulses: Normal pulses.     Heart sounds: Normal heart sounds. No murmur heard. Pulmonary:     Effort: Pulmonary effort is normal. No respiratory distress.     Breath sounds: Normal breath sounds. No wheezing.  Skin:    General: Skin is warm and dry.     Comments: Right hand lateral area firm darkened, no drainage noted during visit.   Neurological:     General: No focal deficit present.     Mental Status: She is alert and oriented to person, place, and time.     Cranial Nerves: No cranial nerve deficit.     Motor: No weakness.  Psychiatric:        Mood and Affect: Mood normal.        Behavior: Behavior normal.        Thought Content: Thought content normal.        Judgment: Judgment normal.         Assessment And Plan:     1. Encounter for wound  re-check Comments: Right hand wound is healing, no drainage noted. Hyperpigmented skin appears to be a scab. No additional antibiotics at this time  2. Anxiety Comments: When this occurs she feels her throat closes up especially when she is emotional  3. Mixed hyperlipidemia Comments: Diet controlled, encouraged to eat a low fat diet  4. Connective tissue disease ([place]) Comments: Continue f/u with Rhuematology  5. Psoriasis Comments: Continue f/u with Dermatology  6. Need for influenza vaccination Influenza vaccine administered Encouraged to take Tylenol as needed for fever or muscle aches. - Flu Vaccine QUAD 6+ mos PF IM (Fluarix Quad PF)  7. Class 3 severe obesity due to excess calories without serious comorbidity with body mass index (BMI) of 50.0 to 59.9 in adult [facility]) She is encouraged to strive for BMI less than 30 to decrease cardiac risk. Advised to aim for at least 150 minutes of exercise per week.   8. Disorder of vocal cord Comments: Would like referral to speech therapy, - Ambulatory referral to Speech Therapy    Patient was given opportunity to ask questions. Patient verbalized understanding of the plan and was able to repeat key elements of the plan. All questions were answered to their satisfaction.  Jasmine Brine, FNP   I, Jasmine Brine, FNP, have reviewed all documentation for this visit. The documentation on 10/05/22 for the exam, diagnosis, procedures, and orders are all accurate and complete.   IF YOU HAVE BEEN REFERRED TO A SPECIALIST, IT MAY TAKE 1-2 WEEKS TO SCHEDULE/PROCESS THE REFERRAL. IF YOU HAVE NOT HEARD FROM  US/SPECIALIST IN TWO WEEKS, PLEASE GIVE [place] A CALL AT [phone].   THE PATIENT IS ENCOURAGED TO PRACTICE SOCIAL DISTANCING DUE TO THE COVID-19 PANDEMIC.

## 2022-10-05 NOTE — Patient Instructions (Addendum)
Wound Care, Adult Taking care of your wound properly can help to prevent pain, infection, and scarring. It can also help your wound heal more quickly. Follow instructions from your health care provider about how to care for your wound. Supplies needed: Soap and water. Wound cleanser, saline, or germ-free (sterile) water. Gauze. If needed, a clean bandage (dressing) or other type of wound dressing material to cover or place in the wound. Follow your health care provider's instructions about what dressing supplies to use. Cream or topical ointment to apply to the wound, if told by your health care provider. How to care for your wound Cleaning the wound Ask your health care provider how to clean the wound. This may include: Using mild soap and water, a wound cleanser, saline, or sterile water. Using a clean gauze to pat the wound dry after cleaning it. Do not rub or scrub the wound. Dressing care Wash your hands with soap and water for at least 20 seconds before and after you change the dressing. If soap and water are not available, use hand sanitizer. Change your dressing as told by your health care provider. This may include: Cleaning or rinsing out (irrigating) the wound. Application of cream or topical ointment, if told by your health care provider. Placing a dressing over the wound or in the wound (packing). Covering the wound with an outer dressing. Leave stitches (sutures), staples, skin glue, or adhesive strips in place. These skin closures may need to stay in place for 2 weeks or longer. If adhesive strip edges start to loosen and curl up, you may trim the loose edges. Do not remove adhesive strips completely unless your health care provider tells you to do that. Ask your health care provider when you can leave the wound uncovered. Checking for infection Check your wound area every day for signs of infection. Check for: More redness, swelling, or pain. Fluid or blood. Warmth. Pus or  a bad smell.  Follow these instructions at home Medicines If you were prescribed an antibiotic medicine, cream, or ointment, take or apply it as told by your health care provider. Do not stop using the antibiotic even if your condition improves. If you were prescribed pain medicine, take it 30 minutes before you do any wound care or as told by your health care provider. Take over-the-counter and prescription medicines only as told by your health care provider. Eating and drinking Eat a diet that includes protein, vitamin A, vitamin C, and other nutrient-rich foods to help the wound heal. Foods rich in protein include meat, fish, eggs, dairy, beans, and nuts. Foods rich in vitamin A include carrots and dark green, leafy vegetables. Foods rich in vitamin C include citrus fruits, tomatoes, broccoli, and peppers. Drink enough fluid to keep your urine pale yellow. General instructions Do not take baths, swim, or use a hot tub until your health care provider approves. Ask your health care provider if you may take showers. You may only be allowed to take sponge baths. Do not scratch or pick at the wound. Keep it covered as told by your health care provider. Return to your normal activities as told by your health care provider. Ask your health care provider what activities are safe for you. Protect your wound from the sun when you are outside for the first 6 months, or for as long as told by your health care provider. Cover up the scar area or apply sunscreen that has an SPF of at least 30. Do not   use any products that contain nicotine or tobacco. These products include cigarettes, chewing tobacco, and vaping devices, such as e-cigarettes. If you need help quitting, ask your health care provider. Keep all follow-up visits. This is important. Contact a health care provider if: You received a tetanus shot and you have swelling, severe pain, redness, or bleeding at the injection site. Your pain is not  controlled with medicine. You have any of these signs of infection: More redness, swelling, or pain around the wound. Fluid or blood coming from the wound. Warmth coming from the wound. A fever or chills. You are nauseous or you vomit. You are dizzy. You have a new rash or hardness around the wound. Get help right away if: You have a red streak of skin near the area around your wound. Pus or a bad smell coming from the wound. Your wound has been closed with staples, sutures, skin glue, or adhesive strips and it begins to open up and separate. Your wound is bleeding, and the bleeding does not stop with gentle pressure. These symptoms may represent a serious problem that is an emergency. Do not wait to see if the symptoms will go away. Get medical help right away. Call your local emergency services (911 in the [place] Do not drive yourself to the hospital. Summary Always wash your hands with soap and water for at least 20 seconds before and after changing your dressing. Change your dressing as told by your health care provider. To help with healing, eat foods that are rich in protein, vitamin A, vitamin C, and other nutrients. Check your wound every day for signs of infection. Contact your health care provider if you think that your wound is infected. This information is not intended to replace advice given to you by your health care provider. Make sure you discuss any questions you have with your health care provider. Document Revised: 02/02/2021 Document Reviewed: 02/02/2021 Elsevier Patient Education  [place].   Influenza (Flu) Vaccine (Inactivated or Recombinant): What You Need to Know 1. Why get vaccinated? Influenza vaccine can prevent influenza (flu). Flu is a contagious disease that spreads around the [place] every year, usually between October and May. Anyone can get the flu, but it is more dangerous for some people. Infants and young children, people 14 years and  older, pregnant people, and people with certain health conditions or a weakened immune system are at greatest risk of flu complications. Pneumonia, bronchitis, sinus infections, and ear infections are examples of flu-related complications. If you have a medical condition, such as heart disease, cancer, or diabetes, flu can make it worse. Flu can cause fever and chills, sore throat, muscle aches, fatigue, cough, headache, and runny or stuffy nose. Some people may have vomiting and diarrhea, though this is more common in children than adults. In an average year, thousands of people in the [place] die from flu, and many more are hospitalized. Flu vaccine prevents millions of illnesses and flu-related visits to the doctor each year. 2. Influenza vaccines CDC recommends everyone 6 months and older get vaccinated every flu season. Children 6 months through 106 years of age may need 2 doses during a single flu season. Everyone else needs only 1 dose each flu season. It takes about 2 weeks for protection to develop after vaccination. There are many flu viruses, and they are always changing. Each year a new flu vaccine is made to protect against the influenza viruses believed to be likely to cause disease  in the upcoming flu season. Even when the vaccine doesn't exactly match these viruses, it may still provide some protection. Influenza vaccine does not cause flu. Influenza vaccine may be given at the same time as other vaccines. 3. Talk with your health care provider Tell your vaccination provider if the person getting the vaccine: Has had an allergic reaction after a previous dose of influenza vaccine, or has any severe, life-threatening allergies Has ever had Guillain-Barr Syndrome (also called "GBS") In some cases, your health care provider may decide to postpone influenza vaccination until a future visit. Influenza vaccine can be administered at any time during pregnancy. People who are or will be  pregnant during influenza season should receive inactivated influenza vaccine. People with minor illnesses, such as a cold, may be vaccinated. People who are moderately or severely ill should usually wait until they recover before getting influenza vaccine. Your health care provider can give you more information. 4. Risks of a vaccine reaction Soreness, redness, and swelling where the shot is given, fever, muscle aches, and headache can happen after influenza vaccination. There may be a very small increased risk of Guillain-Barr Syndrome (GBS) after inactivated influenza vaccine (the flu shot). Young children who get the flu shot along with pneumococcal vaccine (PCV13) and/or DTaP vaccine at the same time might be slightly more likely to have a seizure caused by fever. Tell your health care provider if a child who is getting flu vaccine has ever had a seizure. People sometimes faint after medical procedures, including vaccination. Tell your provider if you feel dizzy or have vision changes or ringing in the ears. As with any medicine, there is a very remote chance of a vaccine causing a severe allergic reaction, other serious injury, or death. 5. What if there is a serious problem? An allergic reaction could occur after the vaccinated person leaves the clinic. If you see signs of a severe allergic reaction (hives, swelling of the face and throat, difficulty breathing, a fast heartbeat, dizziness, or weakness), call 9-1-1 and get the person to the nearest hospital. For other signs that concern you, call your health care provider. Adverse reactions should be reported to the Vaccine Adverse Event Reporting System (VAERS). Your health care provider will usually file this report, or you can do it yourself. Visit the VAERS website at www.vaers.SamedayNews.es or call 2066972573. VAERS is only for reporting reactions, and VAERS staff members do not give medical advice. 6. The National Vaccine Injury Compensation  Program The [employer] Vaccine Injury Compensation Program (VICP) is a federal program that was created to compensate people who may have been injured by certain vaccines. Claims regarding alleged injury or death due to vaccination have a time limit for filing, which may be as short as two years. Visit the VICP website at GoldCloset.com.ee or call (410)718-2846 to learn about the program and about filing a claim. 7. How can I learn more? Ask your health care provider. Call your local or state health department. Visit the website of the Food and Drug Administration (FDA) for vaccine package inserts and additional information at TraderRating.uy. Contact the Centers for Disease Control and Prevention (CDC): Call 847 759 1255 (1-800-CDC-INFO) or Visit CDC's website at https://gibson.com/. Source: CDC Vaccine Information Statement Inactivated Influenza Vaccine (05/15/2020) This same material is available at http://www.wolf.info/ for no charge. This information is not intended to replace advice given to you by your health care provider. Make sure you discuss any questions you have with your health care provider. Document Revised: 08/24/2021 Document  Reviewed: 06/17/2021 Elsevier Patient Education  [place].

## 2022-12-29 ENCOUNTER — Ambulatory Visit: Payer: Commercial Managed Care - PPO | Admitting: Nurse Practitioner

## 2022-12-29 ENCOUNTER — Encounter: Payer: Self-pay | Admitting: Nurse Practitioner

## 2022-12-29 VITALS — BP 138/82 | HR 80 | Temp 98.6°F | Ht 66.0 in | Wt 330.0 lb

## 2022-12-29 DIAGNOSIS — F439 Reaction to severe stress, unspecified: Secondary | ICD-10-CM | POA: Diagnosis not present

## 2022-12-29 DIAGNOSIS — Z0000 Encounter for general adult medical examination without abnormal findings: Secondary | ICD-10-CM | POA: Insufficient documentation

## 2022-12-29 DIAGNOSIS — N926 Irregular menstruation, unspecified: Secondary | ICD-10-CM | POA: Diagnosis not present

## 2022-12-29 DIAGNOSIS — D509 Iron deficiency anemia, unspecified: Secondary | ICD-10-CM

## 2022-12-29 DIAGNOSIS — F419 Anxiety disorder, unspecified: Secondary | ICD-10-CM

## 2022-12-29 DIAGNOSIS — Z6841 Body Mass Index (BMI) 40.0 and over, adult: Secondary | ICD-10-CM

## 2022-12-29 DIAGNOSIS — M1990 Unspecified osteoarthritis, unspecified site: Secondary | ICD-10-CM | POA: Insufficient documentation

## 2022-12-29 DIAGNOSIS — L659 Nonscarring hair loss, unspecified: Secondary | ICD-10-CM | POA: Insufficient documentation

## 2022-12-29 DIAGNOSIS — E782 Mixed hyperlipidemia: Secondary | ICD-10-CM

## 2022-12-29 DIAGNOSIS — R21 Rash and other nonspecific skin eruption: Secondary | ICD-10-CM | POA: Insufficient documentation

## 2022-12-29 DIAGNOSIS — N760 Acute vaginitis: Secondary | ICD-10-CM | POA: Insufficient documentation

## 2022-12-29 MED ORDER — ACCRUFER 30 MG PO CAPS: 1.0000 | ORAL_CAPSULE | Freq: Every day | ORAL | 5 refills | Status: DC

## 2022-12-29 MED ORDER — ALPRAZOLAM 0.25 MG PO TABS: 0.2500 mg | ORAL_TABLET | Freq: Three times a day (TID) | ORAL | 0 refills | Status: AC | PRN

## 2022-12-29 MED ORDER — BUSPIRONE HCL 5 MG PO TABS: 5.0000 mg | ORAL_TABLET | Freq: Every day | ORAL | 1 refills | Status: DC

## 2022-12-29 NOTE — Progress Notes (Signed)
I,Sheena H Holbrook,acting as a Education administrator for [name], FNP.,have documented all relevant documentation on the behalf of [name], FNP,as directed by  [name], FNP while in the presence of [name], [place].    Subjective:     Patient ID: Jasmine Schultz , female    DOB: 06/18/1976 , 47 y.o.   MRN: [id number]   Chief Complaint  Patient presents with   Medical Management of Chronic Issues    HPI  Patient presents today for anxiety follow up. Patient has had increased stress and panic attacks recently. She has had several deaths in her family and she has not been able to cope well. She does have an upcoming appointment with [facility] for her vocal cord dysfunction. Patient also reports she has been on her menstrual cycle since February 19th - she is able to control this more, previous one was from December 13-January 31 was heavy - had to sleep on pads and purchase some rubber underwear; she does not currently have a gynecologist.       Past Medical History:  Diagnosis Date   Anxiety attack    Arthritis    Asthma    diagnosed in march 2019   Bursitis    Chronically dry eyes    GERD (gastroesophageal reflux disease)    IBS (irritable bowel syndrome)    Lupus ([place])    Migraine    Multiple allergies    Sun allergy    Tendonitis      Family History  Problem Relation Age of Onset   Asthma Mother    Hypertension Mother    CAD Other    CVA Other    Aneurysm Other        brain aneurysm multiple family members   Breast cancer Other        grandmother   Sickle cell anemia Other        maternal cousin   Aneurysm Maternal Aunt    Diabetes Maternal Aunt    Diabetes Maternal Uncle    Diabetes Paternal Aunt    Heart disease Paternal Uncle    Diabetes Paternal Uncle    Breast cancer Maternal Grandmother    Diabetes Maternal Grandmother    Heart disease Maternal Grandfather    Aneurysm Maternal Grandfather    Diabetes Maternal Grandfather    Diabetes Paternal  Grandmother    Heart disease Paternal Grandfather    Diabetes Paternal Grandfather      Current Outpatient Medications:    albuterol (VENTOLIN HFA) 108 (90 Base) MCG/ACT inhaler, Inhale 2 puffs into the lungs every 6 (six) hours as needed for wheezing or shortness of breath., Disp: 18 g, Rfl: 10   Ascorbic Acid (VITAMIN C-ROSE HIPS PO), Take 2,000 mg by mouth daily. With Zinc, Disp: , Rfl:    benzonatate (TESSALON) 100 MG capsule, Take 100 mg by mouth 3 (three) times daily as needed for cough., Disp: , Rfl:    budesonide-formoterol (SYMBICORT) 160-4.5 MCG/ACT inhaler, Inhale 2 puffs into the lungs 2 (two) times daily. (Patient taking differently: Inhale 2 puffs into the lungs 2 (two) times daily. Asthma), Disp: 3 each, Rfl: 2   budesonide-formoterol (SYMBICORT) 80-4.5 MCG/ACT inhaler, Inhale 2 puffs into the lungs in the morning and at bedtime. (Patient taking differently: Inhale 2 puffs into the lungs daily as needed (use if 160-4.5 is to strong /Asthma).), Disp: 3 each, Rfl: 2   Cholecalciferol (VITAMIN D-3) 125 MCG (5000 UT) TABS, Take 15,000 Units by mouth daily.,  Disp: , Rfl:    clobetasol cream (TEMOVATE) [id number] %, Apply 1 application  topically daily as needed (Dermatitis/ Eczema)., Disp: , Rfl: 3   EPINEPHrine 0.3 mg/0.3 mL IJ SOAJ injection, Inject 0.3 mg into the muscle as needed for anaphylaxis., Disp: 1 each, Rfl: 5   fluticasone (FLONASE) 50 MCG/ACT nasal spray, Place 2 sprays into both nostrils 2 (two) times daily., Disp: 18.2 mL, Rfl: 1   hydroxychloroquine (PLAQUENIL) 200 MG tablet, Take 200 mg by mouth 2 (two) times daily., Disp: , Rfl:    Iron-FA-B Cmp-C-Biot-Probiotic (FUSION PLUS) CAPS, Take 1 capsule by mouth daily., Disp: 30 capsule, Rfl: 2   levocetirizine (XYZAL) 5 MG tablet, Take 1 tablet (5 mg total) by mouth every evening., Disp: 30 tablet, Rfl: 10   montelukast (SINGULAIR) 10 MG tablet, TAKE 1 TABLET BY MOUTH AT  BEDTIME, Disp: 90 tablet, Rfl: 3   Multiple Vitamin  (MULTIVITAMIN ADULT PO), Take 1 tablet by mouth daily at 6 (six) AM. Vita fusion/Woman's, Disp: , Rfl:    ondansetron (ZOFRAN ODT) 4 MG disintegrating tablet, Take 1 tablet (4 mg total) by mouth every 8 (eight) hours as needed., Disp: 20 tablet, Rfl: 0   pantoprazole (PROTONIX) 40 MG tablet, TAKE 1 TABLET BY MOUTH TWICE  DAILY, Disp: 180 tablet, Rfl: 3   Polyethyl Glycol-Propyl Glycol (SYSTANE OP), Place 2 drops into both ears daily as needed (Cronic Dry eye)., Disp: , Rfl:    triamcinolone ointment (KENALOG) 0.1 %, Apply 1 Application topically 2 (two) times daily as needed (dermatitis/ Eczema)., Disp: , Rfl:    ALPRAZolam (XANAX) 0.25 MG tablet, Take 1 tablet (0.25 mg total) by mouth 3 (three) times daily as needed for anxiety., Disp: 20 tablet, Rfl: 0   busPIRone (BUSPAR) 5 MG tablet, Take 1 tablet (5 mg total) by mouth daily., Disp: 90 tablet, Rfl: 1   Allergies  Allergen Reactions   Chocolate Anaphylaxis    Mild Anaphylaxis   Cocoa Anaphylaxis    Mild Anaphylaxis   Peanut Butter Flavor Anaphylaxis    Feels congested   Seasonal Ic [Octacosanol] Shortness Of Breath    Trees/ pollen   Tomato Anaphylaxis and Rash   Bacitracin-Polymyxin B Other (See Comments)    Band aids cause- dark spots on her skin and itching/swelling in contact area   Other Other (See Comments)    Sun Blister peel off after a month and leaves scars / skin get hot and really swell up Causes Eczema and Psoriasis  Tar- Throat close and Anaphylaxis  Cigarettes Smoke- Throat close and Anaphylaxis    Oysters [Shellfish Allergy] Hives   Sulfa Antibiotics Hives   Balsam Rash    Positive Patch Test 11/11/21   Iodopropynyl Rash    Positive Patch Test 11/11/21   Quaternium-15 Rash    Positive Patch Test 11/11/21     Review of Systems  Constitutional: Negative.   Respiratory: Negative.    Cardiovascular: Negative.   Genitourinary:  Positive for vaginal bleeding.  Neurological: Negative.   Psychiatric/Behavioral:  The  patient is nervous/anxious.      Today's Vitals   12/29/22 1008  BP: 138/82  Pulse: 80  Temp: 98.6 F (37 C)  TempSrc: Oral  SpO2: 98%  Weight: (!) 330 lb (149.7 kg)  Height: 5\' 6"  (1.676 m)   Body mass index is 53.26 kg/m.   Objective:  Physical Exam Vitals reviewed.  Constitutional:      General: She is not in acute distress.    Appearance:  Normal appearance. She is obese.  Cardiovascular:     Rate and Rhythm: Normal rate and regular rhythm.     Pulses: Normal pulses.     Heart sounds: Normal heart sounds. No murmur heard. Pulmonary:     Effort: Pulmonary effort is normal. No respiratory distress.     Breath sounds: Normal breath sounds. No wheezing.  Skin:    General: Skin is warm and dry.  Neurological:     General: No focal deficit present.     Mental Status: She is alert and oriented to person, place, and time.     Cranial Nerves: No cranial nerve deficit.     Motor: No weakness.  Psychiatric:        Mood and Affect: Mood normal.        Behavior: Behavior normal.        Thought Content: Thought content normal.        Judgment: Judgment normal.         Assessment And Plan:     1. Anxiety Comments: Refill on Xanax given to help with her episodes of anxiety to decrease risk for Bronchospasm - ALPRAZolam (XANAX) 0.25 MG tablet; Take 1 tablet (0.25 mg total) by mouth 3 (three) times daily as needed for anxiety.  Dispense: 20 tablet; Refill: 0 - busPIRone (BUSPAR) 5 MG tablet; Take 1 tablet (5 mg total) by mouth daily.  Dispense: 90 tablet; Refill: 1 - BMP8+eGFR  2. Stress  3. Abnormal menses Comments: Will check iron studies and she is to follow up with GYN - Iron, TIBC and Ferritin Panel - CBC - Ambulatory referral to Gynecology  4. Mixed hyperlipidemia Comments: Diet controlled, continue focusing on diet low in fat - Lipid panel - BMP8+eGFR  5. Iron deficiency anemia, unspecified iron deficiency anemia type Comments: Having difficulty with cost  $70 so will alternate with OTC and prescription. Will see if she can get approved for Accufer - Iron, TIBC and Ferritin Panel  6. Morbid (severe) obesity due to excess calories ([place]) She is encouraged to strive for BMI less than 30 to decrease cardiac risk. Advised to aim for at least 150 minutes of exercise per week.    Patient was given opportunity to ask questions. Patient verbalized understanding of the plan and was able to repeat key elements of the plan. All questions were answered to their satisfaction.  [name], FNP   I, [name], FNP, have reviewed all documentation for this visit. The documentation on 12/29/22 for the exam, diagnosis, procedures, and orders are all accurate and complete.   IF YOU HAVE BEEN REFERRED TO A SPECIALIST, IT MAY TAKE 1-2 WEEKS TO SCHEDULE/PROCESS THE REFERRAL. IF YOU HAVE NOT HEARD FROM US/SPECIALIST IN TWO WEEKS, PLEASE GIVE [place] A CALL AT [phone] X [phone].   THE PATIENT IS ENCOURAGED TO PRACTICE SOCIAL DISTANCING DUE TO THE COVID-19 PANDEMIC.

## 2022-12-30 LAB — CBC
Hematocrit: 37.9 % (ref 34.0–46.6)
Hemoglobin: 12 g/dL (ref 11.1–15.9)
MCH: 27.6 pg (ref 26.6–33.0)
MCHC: 31.7 g/dL (ref 31.5–35.7)
MCV: 87 fL (ref 79–97)
Platelets: 460 10*3/uL — ABNORMAL HIGH (ref 150–450)
RBC: 4.35 x10E6/uL (ref 3.77–5.28)
RDW: 12.3 % (ref 11.7–15.4)
WBC: 4.1 10*3/uL (ref 3.4–10.8)

## 2022-12-30 LAB — BMP8+EGFR
BUN/Creatinine Ratio: 7 — ABNORMAL LOW (ref 9–23)
BUN: 5 mg/dL — ABNORMAL LOW (ref 6–24)
CO2: 22 mmol/L (ref 20–29)
Calcium: 9.7 mg/dL (ref 8.7–10.2)
Chloride: 102 mmol/L (ref 96–106)
Creatinine, Ser: 0.75 mg/dL (ref 0.57–1.00)
Glucose: 82 mg/dL (ref 70–99)
Potassium: 4.9 mmol/L (ref 3.5–5.2)
Sodium: 138 mmol/L (ref 134–144)
eGFR: 98 mL/min/{1.73_m2} (ref >59)

## 2022-12-30 LAB — IRON,TIBC AND FERRITIN PANEL
Ferritin: 28 ng/mL (ref 15–150)
Iron Saturation: 10 % — ABNORMAL LOW (ref 15–55)
Iron: 32 ug/dL (ref 27–159)
Total Iron Binding Capacity: 319 ug/dL (ref 250–450)
UIBC: 287 ug/dL (ref 131–425)

## 2022-12-30 LAB — LIPID PANEL
Chol/HDL Ratio: 3.6 ratio (ref 0.0–4.4)
Cholesterol, Total: 259 mg/dL — ABNORMAL HIGH (ref 100–199)
HDL: 71 mg/dL (ref >39)
LDL Chol Calc (NIH): 175 mg/dL — ABNORMAL HIGH (ref 0–99)
Triglycerides: 80 mg/dL (ref 0–149)
VLDL Cholesterol Cal: 13 mg/dL (ref 5–40)

## 2023-01-03 ENCOUNTER — Telehealth: Payer: Self-pay

## 2023-01-03 NOTE — Telephone Encounter (Signed)
Per pharmacy ACCRUFeR 30MG  capsules requires PA.  PA started via CMM Key: [id number]

## 2023-01-04 NOTE — Telephone Encounter (Signed)
PA has been DENIED:  The denial was based on our criteria for Accrufer Cap 30mg .  The requested drug is not covered by the plan. Please contact member services for further assistance.  Please select alternative medication. Thanks!

## 2023-01-05 ENCOUNTER — Other Ambulatory Visit: Payer: Self-pay | Admitting: Nurse Practitioner

## 2023-01-05 DIAGNOSIS — D509 Iron deficiency anemia, unspecified: Secondary | ICD-10-CM

## 2023-01-05 MED ORDER — FUSION PLUS PO CAPS: 1.0000 | ORAL_CAPSULE | Freq: Every day | ORAL | 2 refills | Status: DC

## 2023-01-10 DIAGNOSIS — R490 Dysphonia: Secondary | ICD-10-CM | POA: Insufficient documentation

## 2023-01-23 DIAGNOSIS — J385 Laryngeal spasm: Secondary | ICD-10-CM | POA: Insufficient documentation

## 2023-02-06 ENCOUNTER — Encounter: Payer: Self-pay | Admitting: Certified Nurse Midwife

## 2023-02-06 ENCOUNTER — Ambulatory Visit: Payer: Commercial Managed Care - PPO | Admitting: Certified Nurse Midwife

## 2023-02-06 VITALS — BP 139/79 | HR 93 | Wt 328.9 lb

## 2023-02-06 DIAGNOSIS — N939 Abnormal uterine and vaginal bleeding, unspecified: Secondary | ICD-10-CM | POA: Diagnosis not present

## 2023-02-06 NOTE — Patient Instructions (Signed)
Uterine Fibroids  Uterine fibroids, also called leiomyomas, are noncancerous (benign) tumors that can grow in the uterus. They can cause heavy menstrual bleeding and pain. Fibroids may also grow in the fallopian tubes, cervix, or tissues (ligaments) near the uterus. You may have one or many fibroids. Fibroids vary in size, weight, and where they grow in the uterus. Some can become quite large. Most fibroids do not require medical treatment. What are the causes? The cause of this condition is not known. What increases the risk? You are more likely to develop this condition if you: Are in your 30s or 40s and have not gone through menopause. Have a family history of this condition. Are of African American descent. Started your menstrual period at age 10 or younger. Have never given birth. Are overweight or obese. What are the signs or symptoms? Many women do not have any symptoms. Symptoms of this condition may include: Heavy menstrual bleeding. Bleeding between menstrual periods. Pain and pressure in the pelvic area, between your hip bones. Pain during sex. Bladder problems, such as needing to urinate right away or more often than usual. Inability to have children (infertility). Failure to carry pregnancy to term (miscarriage). How is this diagnosed? This condition may be diagnosed based on: Your symptoms and medical history. A physical exam. A pelvic exam that includes feeling for any tumors. Imaging tests, such as ultrasound or MRI. How is this treated? Treatment for this condition may include follow-up visits with your health care provider to monitor your fibroids for any changes. Other treatment may include: Medicines, such as: Medicines to relieve pain, including aspirin and NSAIDs, such as ibuprofen or naproxen. Hormone therapy. Treatment may be given as a pill or an injection, or it may be inserted into the uterus using an intrauterine device (IUD). Surgery that would do one of  the following: Remove the fibroids (myomectomy). This may be recommended if fibroids affect your fertility and you want to become pregnant. Remove the uterus (hysterectomy). Block the blood supply to the fibroids (uterine artery embolization). This can cause them to shrink and die. Follow these instructions at home: Medicines Take over-the-counter and prescription medicines only as told by your health care provider. Ask your health care provider if you should take iron pills or eat more iron-rich foods, such as dark green, leafy vegetables. Heavy menstrual bleeding can cause low iron levels. Managing pain If directed, apply heat to your back or abdomen to reduce pain. Use the heat source that your health care provider recommends, such as a moist heat pack or a heating pad. To apply heat: Place a towel between your skin and the heat source. Leave the heat on for 20-30 minutes. Remove the heat if your skin turns bright red. This is especially important if you are unable to feel pain, heat, or cold. You may have a greater risk of getting burned.  General instructions Pay close attention to your menstrual cycle. Tell your health care provider about any changes, such as: Heavier bleeding that requires you to change your pads or tampons more than usual. A change in the number of days that your menstrual period lasts. A change in symptoms that come with your menstrual period, such as back pain or cramps in your abdomen. Keep all follow-up visits. This is important, especially if your fibroids need to be monitored for any changes. Contact a health care provider if you: Have pelvic pain, back pain, or cramps in your abdomen that do not get better with   medicine or heat. Develop new bleeding between menstrual periods. Have increased bleeding during or between menstrual periods. Feel more tired or weak than usual. Feel light-headed. Get help right away if you: Faint. Have pelvic pain that suddenly  gets worse. Have severe vaginal bleeding that soaks a tampon or pad in 30 minutes or less. Summary Uterine fibroids are noncancerous (benign) tumors that can develop in the uterus. The exact cause of this condition is not known. Most fibroids do not require medical treatment unless they affect your ability to have children (fertility). Contact a health care provider if you have pelvic pain, back pain, or cramps in your abdomen that do not get better with medicines. Get help right away if you faint, have pelvic pain that suddenly gets worse, or have severe vaginal bleeding. This information is not intended to replace advice given to you by your health care provider. Make sure you discuss any questions you have with your health care provider. Document Revised: 04/28/2020 Document Reviewed: 04/28/2020 Elsevier Patient Education  2023 Elsevier Inc.  

## 2023-02-06 NOTE — Progress Notes (Signed)
GYN ENCOUNTER NOTE  Subjective:       Jasmine Schultz is a 47 y.o. No obstetric history on file. female is here for gynecologic evaluation of the following issues:  1. Abnormal uterine bleeding. PT state she has had heavy periods for years but more recently they have become more painful 8-9/10 and lasting most of the month.States she bleeds for weeks at a time with a few days break.She admits to having a history of fiborids. She has not had to do any medication /procedures to manage up to this point. .     Gynecologic History Patient's last menstrual period was 02/04/2023 (exact date). Contraception: none Last Pap: 06/30/2022. Results were: normal Last mammogram: 03/30/2018. Results were: normal  Obstetric History OB History  No obstetric history on file.    Past Medical History:  Diagnosis Date   Anxiety attack    Arthritis    Asthma    diagnosed in march 2019   Bursitis    Chronically dry eyes    GERD (gastroesophageal reflux disease)    IBS (irritable bowel syndrome)    Lupus ([facility])    Migraine    Multiple allergies    Sun allergy    Tendonitis     Past Surgical History:  Procedure Laterality Date   BREAST REDUCTION SURGERY     COLONOSCOPY WITH PROPOFOL N/A 04/15/2022   Procedure: COLONOSCOPY WITH PROPOFOL;  Surgeon: Jeani Hawking, MD;  Location: [facility];  Service: Gastroenterology;  Laterality: N/A;   POLYPECTOMY  04/15/2022   Procedure: POLYPECTOMY;  Surgeon: Jeani Hawking, MD;  Location: [facility];  Service: Gastroenterology;;   REDUCTION MAMMAPLASTY Bilateral    2002   TUBAL LIGATION      Current Outpatient Medications on File Prior to Visit  Medication Sig Dispense Refill   albuterol (VENTOLIN HFA) 108 (90 Base) MCG/ACT inhaler Inhale 2 puffs into the lungs every 6 (six) hours as needed for wheezing or shortness of breath. 18 g 10   ALPRAZolam (XANAX) 0.25 MG tablet Take 1 tablet (0.25 mg total) by mouth 3 (three) times daily as needed for anxiety. 20 tablet  0   Ascorbic Acid (VITAMIN C-ROSE HIPS PO) Take 2,000 mg by mouth daily. With Zinc     benzonatate (TESSALON) 100 MG capsule Take 100 mg by mouth 3 (three) times daily as needed for cough.     budesonide-formoterol (SYMBICORT) 160-4.5 MCG/ACT inhaler Inhale 2 puffs into the lungs 2 (two) times daily. (Patient taking differently: Inhale 2 puffs into the lungs 2 (two) times daily. Asthma) 3 each 2   budesonide-formoterol (SYMBICORT) 80-4.5 MCG/ACT inhaler Inhale 2 puffs into the lungs in the morning and at bedtime. (Patient taking differently: Inhale 2 puffs into the lungs daily as needed (use if 160-4.5 is to strong /Asthma).) 3 each 2   busPIRone (BUSPAR) 5 MG tablet Take 1 tablet (5 mg total) by mouth daily. 90 tablet 1   Cholecalciferol (VITAMIN D-3) 125 MCG (5000 UT) TABS Take 15,000 Units by mouth daily.     clobetasol cream (TEMOVATE) 0.05 % Apply 1 application  topically daily as needed (Dermatitis/ Eczema).  3   EPINEPHrine 0.3 mg/0.3 mL IJ SOAJ injection Inject 0.3 mg into the muscle as needed for anaphylaxis. 1 each 5   fluticasone (FLONASE) 50 MCG/ACT nasal spray Place 2 sprays into both nostrils 2 (two) times daily. 18.2 mL 1   hydroxychloroquine (PLAQUENIL) 200 MG tablet Take 200 mg by mouth 2 (two) times daily.  Iron-FA-B Cmp-C-Biot-Probiotic (FUSION PLUS) CAPS Take 1 capsule by mouth daily. 30 capsule 2   levocetirizine (XYZAL) 5 MG tablet Take 1 tablet (5 mg total) by mouth every evening. 30 tablet 10   montelukast (SINGULAIR) 10 MG tablet TAKE 1 TABLET BY MOUTH AT  BEDTIME 90 tablet 3   Multiple Vitamin (MULTIVITAMIN ADULT PO) Take 1 tablet by mouth daily at 6 (six) AM. Vita fusion/Woman's     ondansetron (ZOFRAN ODT) 4 MG disintegrating tablet Take 1 tablet (4 mg total) by mouth every 8 (eight) hours as needed. 20 tablet 0   pantoprazole (PROTONIX) 40 MG tablet TAKE 1 TABLET BY MOUTH TWICE  DAILY 180 tablet 3   Polyethyl Glycol-Propyl Glycol (SYSTANE OP) Place 2 drops into both  ears daily as needed (Cronic Dry eye).     triamcinolone ointment (KENALOG) 0.1 % Apply 1 Application topically 2 (two) times daily as needed (dermatitis/ Eczema).     No current facility-administered medications on file prior to visit.    Allergies  Allergen Reactions   Chocolate Anaphylaxis    Mild Anaphylaxis   Cocoa Anaphylaxis    Mild Anaphylaxis   Peanut Butter Flavor Anaphylaxis    Feels congested   Seasonal Ic [Octacosanol] Shortness Of Breath    Trees/ pollen   Tomato Anaphylaxis and Rash   Bacitracin-Polymyxin B Other (See Comments)    Band aids cause- dark spots on her skin and itching/swelling in contact area   Other Other (See Comments)    Sun Blister peel off after a month and leaves scars / skin get hot and really swell up Causes Eczema and Psoriasis  Tar- Throat close and Anaphylaxis  Cigarettes Smoke- Throat close and Anaphylaxis    Oysters [Shellfish Allergy] Hives   Sulfa Antibiotics Hives   Balsam Rash    Positive Patch Test 11/11/21   Iodopropynyl Rash    Positive Patch Test 11/11/21   Quaternium-15 Rash    Positive Patch Test 11/11/21    Social History   Socioeconomic History   Marital status: Married    Spouse name: Not on file   Number of children: Not on file   Years of education: Not on file   Highest education level: Not on file  Occupational History   Occupation: Haematologist: [employer]    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  Education college.  No children.     Social Determinants of Health   Financial Resource Strain: Not on file  Food Insecurity: Not on file  Transportation Needs: Not  on file  Physical Activity: Not on file  Stress: Not on file  Social Connections: Not on file  Intimate Partner Violence: Not on file    Family History  Problem Relation Age of Onset   Asthma Mother    Hypertension Mother    CAD Other    CVA Other    Aneurysm Other        brain aneurysm multiple family members   Breast cancer Other        grandmother  Sickle cell anemia Other        maternal cousin   Aneurysm Maternal Aunt    Diabetes Maternal Aunt    Diabetes Maternal Uncle    Diabetes Paternal Aunt    Heart disease Paternal Uncle    Diabetes Paternal Uncle    Breast cancer Maternal Grandmother    Diabetes Maternal Grandmother    Heart disease Maternal Grandfather    Aneurysm Maternal Grandfather    Diabetes Maternal Grandfather    Diabetes Paternal Grandmother    Heart disease Paternal Grandfather    Diabetes Paternal Grandfather     The following portions of the patient's history were reviewed and updated as appropriate: allergies, current medications, past family history, past medical history, past social history, past surgical history and problem list.  Review of Systems Review of Systems - Negative except as mentioned in HPI  Review of Systems - General ROS: negative for - chills, fatigue, fever, hot flashes, malaise or night sweats Hematological and Lymphatic ROS: negative for - bleeding problems or swollen lymph nodes Gastrointestinal ROS: negative for - abdominal pain, blood in stools, change in bowel habits and nausea/vomiting Musculoskeletal ROS: negative for - joint pain, muscle pain or muscular weakness Genito-Urinary ROS: negative for - change in menstrual cycle, dysmenorrhea, dyspareunia, dysuria, genital discharge, genital ulcers, hematuria, incontinence, nocturia , or  ,pelvic pain Positive for   heavy menses , prolonged painful periods   Objective:   BP 139/79   Pulse 93   Wt (!) 328 lb 14.4 oz (149.2 kg)   LMP 02/04/2023 (Exact Date)   BMI 53.09  kg/m  CONSTITUTIONAL: Well-developed, well-nourished female in no acute distress.  HENT:  Normocephalic, atraumatic.  NECK: Normal range of motion, supple, no masses.  Normal thyroid.  SKIN: Skin is warm and dry. No rash noted. Not diaphoretic. No erythema. No pallor. NEUROLGIC: Alert and oriented to person, place, and time. PSYCHIATRIC: Normal mood and affect. Normal behavior. Normal judgment and thought content. CARDIOVASCULAR:Not Examined RESPIRATORY: Not Examined BREASTS: Not Examined ABDOMEN: Soft, non distended; Non tender.  No Organomegaly. PELVIC:not indicated  MUSCULOSKELETAL: Normal range of motion. No tenderness.  No cyanosis, clubbing, or edema.     Assessment:   1. Abnormal uterine bleeding - US PELVIC COMPLETE WITH TRANSVAGINAL; Future     Plan:   Discussed uterine fiborids and likely cause to heavy , prolonged , painful cycle. Pt state she is not concern about her fertility as she is not wanting any children.  Recommend u/s for evaluation of location and size. Discussed common treatment options including birth control, lysteda, progestin, myfembree, ablation , hysterectomy. She will research and discuss with her partner the options as I described to her and will let me know how she would like to proceed.   Face to face time 15 min

## 2023-02-08 ENCOUNTER — Ambulatory Visit (INDEPENDENT_AMBULATORY_CARE_PROVIDER_SITE_OTHER): Payer: Commercial Managed Care - PPO

## 2023-02-08 ENCOUNTER — Other Ambulatory Visit: Payer: Self-pay | Admitting: Certified Nurse Midwife

## 2023-02-08 DIAGNOSIS — N939 Abnormal uterine and vaginal bleeding, unspecified: Secondary | ICD-10-CM | POA: Diagnosis not present

## 2023-02-18 ENCOUNTER — Encounter: Payer: Self-pay | Admitting: Certified Nurse Midwife

## 2023-05-23 ENCOUNTER — Other Ambulatory Visit: Payer: Self-pay | Admitting: Nurse Practitioner

## 2023-05-23 DIAGNOSIS — D509 Iron deficiency anemia, unspecified: Secondary | ICD-10-CM

## 2023-07-05 ENCOUNTER — Ambulatory Visit (INDEPENDENT_AMBULATORY_CARE_PROVIDER_SITE_OTHER): Payer: Commercial Managed Care - PPO | Admitting: Nurse Practitioner

## 2023-07-05 ENCOUNTER — Encounter: Payer: Self-pay | Admitting: Nurse Practitioner

## 2023-07-05 VITALS — BP 100/70 | HR 85 | Temp 99.2°F | Ht 66.0 in | Wt 335.4 lb

## 2023-07-05 DIAGNOSIS — Z23 Encounter for immunization: Secondary | ICD-10-CM

## 2023-07-05 DIAGNOSIS — Z0000 Encounter for general adult medical examination without abnormal findings: Secondary | ICD-10-CM | POA: Diagnosis not present

## 2023-07-05 DIAGNOSIS — N939 Abnormal uterine and vaginal bleeding, unspecified: Secondary | ICD-10-CM

## 2023-07-05 DIAGNOSIS — D509 Iron deficiency anemia, unspecified: Secondary | ICD-10-CM | POA: Diagnosis not present

## 2023-07-05 DIAGNOSIS — F419 Anxiety disorder, unspecified: Secondary | ICD-10-CM

## 2023-07-05 DIAGNOSIS — L308 Other specified dermatitis: Secondary | ICD-10-CM

## 2023-07-05 DIAGNOSIS — G4733 Obstructive sleep apnea (adult) (pediatric): Secondary | ICD-10-CM

## 2023-07-05 DIAGNOSIS — Z6841 Body Mass Index (BMI) 40.0 and over, adult: Secondary | ICD-10-CM

## 2023-07-05 DIAGNOSIS — Z79899 Other long term (current) drug therapy: Secondary | ICD-10-CM

## 2023-07-05 DIAGNOSIS — E782 Mixed hyperlipidemia: Secondary | ICD-10-CM | POA: Diagnosis not present

## 2023-07-05 DIAGNOSIS — Z86711 Personal history of pulmonary embolism: Secondary | ICD-10-CM

## 2023-07-05 MED ORDER — CLOBETASOL PROPIONATE 0.05 % EX OINT: 1.0000 | TOPICAL_OINTMENT | Freq: Two times a day (BID) | CUTANEOUS | 5 refills | Status: AC

## 2023-07-05 MED ORDER — WEGOVY 0.25 MG/0.5ML ~~LOC~~ SOAJ: 0.2500 mg | SUBCUTANEOUS | 0 refills | Status: DC

## 2023-07-05 NOTE — Patient Instructions (Signed)
Health Maintenance  Topic Date Due   Pap with HPV screening  06/30/2025   DTaP/Tdap/Td vaccine (3 - Td or Tdap) 06/24/2031   Colon Cancer Screening  04/15/2032   Flu Shot  Completed   Hepatitis C Screening  Completed   HIV Screening  Completed   HPV Vaccine  Aged Out   COVID-19 Vaccine  Discontinued

## 2023-07-06 LAB — CBC WITH DIFFERENTIAL/PLATELET
Basophils Absolute: 0.1 10*3/uL (ref 0.0–0.2)
Basos: 1 %
EOS (ABSOLUTE): 0.3 10*3/uL (ref 0.0–0.4)
Eos: 6 %
Hematocrit: 30.4 % — ABNORMAL LOW (ref 34.0–46.6)
Hemoglobin: 9.2 g/dL — ABNORMAL LOW (ref 11.1–15.9)
Immature Grans (Abs): 0 10*3/uL (ref 0.0–0.1)
Immature Granulocytes: 0 %
Lymphocytes Absolute: 1.4 10*3/uL (ref 0.7–3.1)
Lymphs: 26 %
MCH: 27.3 pg (ref 26.6–33.0)
MCHC: 30.3 g/dL — ABNORMAL LOW (ref 31.5–35.7)
MCV: 90 fL (ref 79–97)
Monocytes Absolute: 0.7 10*3/uL (ref 0.1–0.9)
Monocytes: 12 %
Neutrophils Absolute: 2.9 10*3/uL (ref 1.4–7.0)
Neutrophils: 55 %
Platelets: 454 10*3/uL — ABNORMAL HIGH (ref 150–450)
RBC: 3.37 x10E6/uL — ABNORMAL LOW (ref 3.77–5.28)
RDW: 12.5 % (ref 11.7–15.4)
WBC: 5.3 10*3/uL (ref 3.4–10.8)

## 2023-07-06 LAB — LIPID PANEL
Chol/HDL Ratio: 3.9 ratio (ref 0.0–4.4)
Cholesterol, Total: 234 mg/dL — ABNORMAL HIGH (ref 100–199)
HDL: 60 mg/dL (ref >39)
LDL Chol Calc (NIH): 159 mg/dL — ABNORMAL HIGH (ref 0–99)
Triglycerides: 85 mg/dL (ref 0–149)
VLDL Cholesterol Cal: 15 mg/dL (ref 5–40)

## 2023-07-06 LAB — IRON,TIBC AND FERRITIN PANEL
Ferritin: 27 ng/mL (ref 15–150)
Iron Saturation: 11 % — ABNORMAL LOW (ref 15–55)
Iron: 31 ug/dL (ref 27–159)
Total Iron Binding Capacity: 292 ug/dL (ref 250–450)
UIBC: 261 ug/dL (ref 131–425)

## 2023-07-06 LAB — CMP14+EGFR
ALT: 15 IU/L (ref 0–32)
AST: 19 IU/L (ref 0–40)
Albumin: 4.1 g/dL (ref 3.9–4.9)
Alkaline Phosphatase: 60 IU/L (ref 44–121)
BUN/Creatinine Ratio: 11 (ref 9–23)
BUN: 9 mg/dL (ref 6–24)
Bilirubin Total: <0.2 mg/dL (ref 0.0–1.2)
CO2: 24 mmol/L (ref 20–29)
Calcium: 9.8 mg/dL (ref 8.7–10.2)
Chloride: 104 mmol/L (ref 96–106)
Creatinine, Ser: 0.82 mg/dL (ref 0.57–1.00)
Globulin, Total: 2.3 g/dL (ref 1.5–4.5)
Glucose: 85 mg/dL (ref 70–99)
Potassium: 4.5 mmol/L (ref 3.5–5.2)
Sodium: 140 mmol/L (ref 134–144)
Total Protein: 6.4 g/dL (ref 6.0–8.5)
eGFR: 88 mL/min/{1.73_m2} (ref >59)

## 2023-07-06 LAB — HEMOGLOBIN A1C
Est. average glucose Bld gHb Est-mCnc: 105 mg/dL
Hgb A1c MFr Bld: 5.3 % (ref 4.8–5.6)

## 2023-07-21 ENCOUNTER — Ambulatory Visit: Payer: Commercial Managed Care - PPO | Admitting: Primary Care

## 2023-07-21 ENCOUNTER — Encounter: Payer: Self-pay | Admitting: Primary Care

## 2023-07-21 VITALS — BP 124/80 | HR 79 | Temp 97.8°F | Ht 66.0 in | Wt 334.0 lb

## 2023-07-21 DIAGNOSIS — G4733 Obstructive sleep apnea (adult) (pediatric): Secondary | ICD-10-CM

## 2023-07-21 DIAGNOSIS — J4540 Moderate persistent asthma, uncomplicated: Secondary | ICD-10-CM

## 2023-07-21 NOTE — Patient Instructions (Addendum)
  Obstructive sleep apnea: - Sleep apnea is well-controlled on current CPAP pressure settings - No changes recommended - Continue to wear CPAP nightly for 4 to 6 hours or longer - Continue to work on weight loss efforts as able, do not drive experiencing excessive daytime sleepiness or fatigue  Asthma: - Continue to use Symbicort 80 mcg daily/Symbicort 160 as needed when asthma symptoms are acutely exacerbated - Use albuterol rescue inhaler 2 puffs every 4-6 hours as needed for breakthrough shortness of breath or wheezing - Continue Singulair 10 mg at bedtime - Notify office if asthma symptoms are not well-controlled or you develop worsening respiratory symptoms or acute bronchitis  Orders: - Renew CPAP supplies with [facility]   Follow-up:  - 1 year with Dr. Lillette Boxer or sooner if needed

## 2023-07-21 NOTE — Progress Notes (Signed)
@Patient  ID: Jasmine Schultz, female    DOB: [birth year], 47 y.o.   MRN: [id number]  Chief Complaint  Patient presents with   Follow-up    Wears CPAP nightly. No problems with pressure or mask. No cough, SOB or wheezing.     Referring provider: Arnette Felts, FNP  HPI: 47 year old female, never smoked. PMH significant for OSA on CPAP, asthma, allergic rhinitis, mixed hyperlipidemia, GERD, obesity.   07/21/2023 Patient presents today for follow-up OSA. She is doing well, sleeping well at night. No residual apneas on download. Asthma is controlled, experiencing no acute symptoms this week. She alternative between Symbicort 80-151mcg dose depending on her symptoms and weather/season. Taking singulair nightly. [facility].  Airview download 06/20/2023 - 07/19/2023 Usage days 30/30 days (100%); 26 days (87%) greater than 4 hours Average usage 7 hours 13 minutes Pressure 5 to 18 cm H2O (13.2 L centimeters H2O-95%) Air leaks 1.0 L/min (95%) AHI 0   Allergies  Allergen Reactions   Chocolate Anaphylaxis    Mild Anaphylaxis   Cocoa Anaphylaxis    Mild Anaphylaxis   Peanut Butter Flavor Anaphylaxis    Feels congested   Seasonal Ic [Octacosanol] Shortness Of Breath    Trees/ pollen   Tomato Anaphylaxis and Rash   Bacitracin-Polymyxin B Other (See Comments)    Band aids cause- dark spots on her skin and itching/swelling in contact area   Other Other (See Comments)    Sun Blister peel off after a month and leaves scars / skin get hot and really swell up Causes Eczema and Psoriasis  Tar- Throat close and Anaphylaxis  Cigarettes Smoke- Throat close and Anaphylaxis    Oysters [Shellfish Allergy] Hives   Sulfa Antibiotics Hives   Balsam Rash    Positive Patch Test 11/11/21   Iodopropynyl Rash    Positive Patch Test 11/11/21   Quaternium-15 Rash    Positive Patch Test 11/11/21    Immunization History  Administered Date(s) Administered   Influenza, Seasonal,  Injecte, Preservative Fre 07/05/2023   Influenza,inj,Quad PF,6+ Mos 08/21/2018, 10/05/2022   Influenza-Unspecified 08/16/2021   Tdap 12/10/2010, 06/23/2021    Past Medical History:  Diagnosis Date   Anxiety attack    Arthritis    Asthma    diagnosed in march 2019   Bursitis    Chronically dry eyes    GERD (gastroesophageal reflux disease)    IBS (irritable bowel syndrome)    Lupus    Migraine    Multiple allergies    Sun allergy    Tendonitis     Tobacco History: Social History   Tobacco Use  Smoking Status Never  Smokeless Tobacco Never   Counseling given: Not Answered   Outpatient Medications Prior to Visit  Medication Sig Dispense Refill   albuterol (VENTOLIN HFA) 108 (90 Base) MCG/ACT inhaler Inhale 2 puffs into the lungs every 6 (six) hours as needed for wheezing or shortness of breath. 18 g 10   ALPRAZolam (XANAX) 0.25 MG tablet Take 1 tablet (0.25 mg total) by mouth 3 (three) times daily as needed for anxiety. 20 tablet 0   Ascorbic Acid (VITAMIN C-ROSE HIPS PO) Take 2,000 mg by mouth daily. With Zinc     benzonatate (TESSALON) 100 MG capsule Take 100 mg by mouth 3 (three) times daily as needed for cough.     budesonide-formoterol (SYMBICORT) 160-4.5 MCG/ACT inhaler Inhale 2 puffs into the lungs 2 (two) times daily. (Patient taking differently: Inhale 2 puffs into  the lungs 2 (two) times daily. Asthma) 3 each 2   budesonide-formoterol (SYMBICORT) 80-4.5 MCG/ACT inhaler Inhale 2 puffs into the lungs in the morning and at bedtime. (Patient taking differently: Inhale 2 puffs into the lungs daily as needed (use if 160-4.5 is to strong /Asthma).) 3 each 2   busPIRone (BUSPAR) 5 MG tablet Take 1 tablet (5 mg total) by mouth daily. 90 tablet 1   Cholecalciferol (VITAMIN D-3) 125 MCG (5000 UT) TABS Take 15,000 Units by mouth daily.     clobetasol ointment (TEMOVATE) 0.05 % Apply 1 Application topically 2 (two) times daily. 60 g 5   EPINEPHrine 0.3 mg/0.3 mL IJ SOAJ  injection Inject 0.3 mg into the muscle as needed for anaphylaxis. 1 each 5   fluticasone (FLONASE) 50 MCG/ACT nasal spray Place 2 sprays into both nostrils 2 (two) times daily. 18.2 mL 1   hydroxychloroquine (PLAQUENIL) 200 MG tablet Take 200 mg by mouth 2 (two) times daily.     Iron-FA-B Cmp-C-Biot-Probiotic (FUSION PLUS) CAPS TAKE 1 CAPSULE BY MOUTH DAILY 30 capsule 2   levocetirizine (XYZAL) 5 MG tablet Take 1 tablet (5 mg total) by mouth every evening. 30 tablet 10   montelukast (SINGULAIR) 10 MG tablet TAKE 1 TABLET BY MOUTH AT  BEDTIME 90 tablet 3   Multiple Vitamin (MULTIVITAMIN ADULT PO) Take 1 tablet by mouth daily at 6 (six) AM. V[employer] fusion/[employer]oman's     ondansetron (ZOFRAN ODT) 4 MG disintegrating tablet Take 1 tablet (4 mg total) by mouth every 8 (eight) hours as needed. 20 tablet 0   pantoprazole (PROTONIX) 40 MG tablet TAKE 1 TABLET BY MOUTH TWICE  DAILY 180 tablet 3   Polyethyl Glycol-Propyl Glycol (SYSTANE OP) Place 2 drops into both ears daily as needed (Cronic Dry eye).     SEMAGLUTIDE,0.25 OR 0.5MG /DOS, [place] Inject 0.25 mg into the skin once a week. Prescribed by [facility]     triamcinolone ointment (KENALOG) 0.1 % Apply 1 Application topically 2 (two) times daily as needed (dermatitis/ Eczema).     Semaglutide-Weight Management (WEGOVY) 0.25 MG/0.5ML SOAJ Inject 0.25 mg into the skin once a week. (Patient not taking: Reported on 07/21/2023) 2 mL 0   No facility-administered medications prior to visit.   Review of Systems  Review of Systems  Constitutional: Negative.   Respiratory:  Positive for cough. Negative for chest tightness, shortness of breath and wheezing.     Physical Exam  BP 124/80 (BP Location: Left Arm, Patient Position: Sitting, Cuff Size: Large)   Pulse 79   Temp 97.8 F (36.6 C) (Temporal)   Ht 5\' 6"  (1.676 m)   Wt (!) 334 lb (151.5 kg)   LMP 06/30/2023   SpO2 98%   BMI 53.91 kg/m  Physical Exam Constitutional:      Appearance: Normal appearance.   HENT:     Head: Normocephalic and atraumatic.  Cardiovascular:     Rate and Rhythm: Normal rate and regular rhythm.  Pulmonary:     Effort: Pulmonary effort is normal.     Breath sounds: Normal breath sounds. No wheezing, rhonchi or rales.  Musculoskeletal:        General: Normal range of motion.  Skin:    General: Skin is warm and dry.  Neurological:     General: No focal deficit present.     Mental Status: She is alert and oriented to person, place, and time. Mental status is at baseline.  Psychiatric:        Mood  and Affect: Mood normal.        Behavior: Behavior normal.        Thought Content: Thought content normal.        Judgment: Judgment normal.      Lab Results:  CBC    Component Value Date/Time   WBC 5.3 07/05/2023 1140   WBC 4.6 05/02/2022 0949   RBC 3.37 (L) 07/05/2023 1140   RBC 4.10 05/02/2022 0949   HGB 9.2 (L) 07/05/2023 1140   HCT 30.4 (L) 07/05/2023 1140   PLT 454 (H) 07/05/2023 1140   MCV 90 07/05/2023 1140   MCH 27.3 07/05/2023 1140   MCH 27.3 05/02/2022 0949   MCHC 30.3 (L) 07/05/2023 1140   MCHC 31.5 05/02/2022 0949   RDW 12.5 07/05/2023 1140   LYMPHSABS 1.4 07/05/2023 1140   MONOABS 0.5 05/02/2022 0949   EOSABS 0.3 07/05/2023 1140   BASOSABS 0.1 07/05/2023 1140    BMET    Component Value Date/Time   NA 140 07/05/2023 1140   K 4.5 07/05/2023 1140   CL 104 07/05/2023 1140   CO2 24 07/05/2023 1140   GLUCOSE 85 07/05/2023 1140   GLUCOSE 102 (H) 05/02/2022 0949   BUN 9 07/05/2023 1140   CREATININE 0.82 07/05/2023 1140   CALCIUM 9.8 07/05/2023 1140   GFRNONAA >60 05/02/2022 0949   GFRAA 86 06/25/2020 0000    BNP No results found for: "BNP"  ProBNP No results found for: "PROBNP"  Imaging: No results found.   Assessment & Plan:   1. OSA on CPAP - Ambulatory Referral for DME  2. Moderate persistent asthma without complication  Obstructive sleep apnea: - Patient is compliant with CPAP and reports benefit from use - Sleep  apnea is well-controlled on current CPAP pressure settings 5-8cm h20 without any residual apneas  - No changes recommended - Continue to wear CPAP nightly for 4 to 6 hours or longer - Continue to work on weight loss efforts as able, do not drive experiencing excessive daytime sleepiness or fatigue  Asthma: - Stable; Asthma not acutely exacerbated.  - Continue Symbicort 80 mcg daily/Symbicort 160 as needed when asthma symptoms are acutely exacerbated - Use albuterol rescue inhaler 2 puffs every 4-6 hours as needed for breakthrough shortness of breath or wheezing - Continue Singulair 10 mg at bedtime - Notify office if asthma symptoms are not well-controlled or patient develops worsening respiratory symptoms or acute bronchitis  Orders: - Renew CPAP supplies with [facility]   Follow-up:  - 1 year with Dr. Lillette Boxer or sooner if needed  Glenford Bayley, NP 07/21/2023

## 2023-07-25 DIAGNOSIS — Z79899 Other long term (current) drug therapy: Secondary | ICD-10-CM | POA: Insufficient documentation

## 2023-07-25 DIAGNOSIS — E782 Mixed hyperlipidemia: Secondary | ICD-10-CM | POA: Insufficient documentation

## 2023-07-25 DIAGNOSIS — L309 Dermatitis, unspecified: Secondary | ICD-10-CM | POA: Insufficient documentation

## 2023-07-25 DIAGNOSIS — Z0000 Encounter for general adult medical examination without abnormal findings: Secondary | ICD-10-CM | POA: Insufficient documentation

## 2023-07-25 DIAGNOSIS — Z23 Encounter for immunization: Secondary | ICD-10-CM | POA: Insufficient documentation

## 2023-07-25 NOTE — Assessment & Plan Note (Signed)
Will change steroid cream to clobetasol.

## 2023-07-25 NOTE — Assessment & Plan Note (Signed)
She is now using a CPAP and doing well.

## 2023-07-25 NOTE — Assessment & Plan Note (Signed)
This has improved, continue current medications.

## 2023-07-25 NOTE — Assessment & Plan Note (Signed)
Influenza vaccine administered Encouraged to take Tylenol as needed for fever or muscle aches.

## 2023-07-25 NOTE — Assessment & Plan Note (Signed)
Cholesterol levels were increased at last visit. No current medications, will recheck lipid panel.

## 2023-07-25 NOTE — Assessment & Plan Note (Addendum)
She is encouraged to strive for BMI less than 30 to decrease cardiac risk. Advised to aim for at least 150 minutes of exercise per week.  Will try to get her approved for [facility], she would benefit as this will help with her other health problems as well.  Discussed side effects to include nausea, constipation. Denies family history of medullary thyroid cancer or personal history of pancreatitis.

## 2023-07-25 NOTE — Assessment & Plan Note (Signed)
Will check iron levels, the fusioin plus was too expensive.

## 2023-07-25 NOTE — Assessment & Plan Note (Signed)
She is being followed by her GYN.

## 2023-07-25 NOTE — Assessment & Plan Note (Signed)

## 2023-08-14 ENCOUNTER — Other Ambulatory Visit: Payer: Self-pay

## 2023-08-14 DIAGNOSIS — F419 Anxiety disorder, unspecified: Secondary | ICD-10-CM

## 2023-08-14 MED ORDER — BUSPIRONE HCL 5 MG PO TABS: 5.0000 mg | ORAL_TABLET | Freq: Every day | ORAL | 1 refills | Status: DC

## 2023-08-16 ENCOUNTER — Other Ambulatory Visit: Payer: Self-pay | Admitting: Nurse Practitioner

## 2023-08-16 DIAGNOSIS — J45909 Unspecified asthma, uncomplicated: Secondary | ICD-10-CM

## 2023-08-28 ENCOUNTER — Other Ambulatory Visit: Payer: Self-pay

## 2023-08-28 DIAGNOSIS — K219 Gastro-esophageal reflux disease without esophagitis: Secondary | ICD-10-CM

## 2023-08-28 MED ORDER — PANTOPRAZOLE SODIUM 40 MG PO TBEC: 40.0000 mg | DELAYED_RELEASE_TABLET | Freq: Two times a day (BID) | ORAL | 3 refills | Status: DC

## 2023-09-04 ENCOUNTER — Other Ambulatory Visit: Payer: Self-pay

## 2023-09-04 DIAGNOSIS — J45909 Unspecified asthma, uncomplicated: Secondary | ICD-10-CM

## 2023-09-04 DIAGNOSIS — K219 Gastro-esophageal reflux disease without esophagitis: Secondary | ICD-10-CM

## 2023-09-04 DIAGNOSIS — D509 Iron deficiency anemia, unspecified: Secondary | ICD-10-CM

## 2023-09-04 MED ORDER — FUSION PLUS PO CAPS: 1.0000 | ORAL_CAPSULE | Freq: Every day | ORAL | 2 refills | Status: DC

## 2023-09-04 MED ORDER — PANTOPRAZOLE SODIUM 40 MG PO TBEC: 40.0000 mg | DELAYED_RELEASE_TABLET | Freq: Two times a day (BID) | ORAL | 3 refills | Status: AC

## 2023-09-04 MED ORDER — MONTELUKAST SODIUM 10 MG PO TABS: ORAL_TABLET | ORAL | 3 refills | Status: DC

## 2023-12-20 ENCOUNTER — Ambulatory Visit: Payer: Self-pay | Admitting: Nurse Practitioner

## 2023-12-20 NOTE — Telephone Encounter (Signed)
 Patient would like someone in PCP office to contact [facility] asap in regard to Montelukast medication. Patient states she has been out of this medication for 1 year because of a clerical issue/miscommunication between [facility] and PCP office in regard to instructions for prescription. [facility] is stating they are receiving different instructions for dosing than what the PCP is sending over, and are denying the patient's request for fill. Patient has a # for [facility] as [phone]. Patient states the most recent medication request sent to them in November of 2024 was denied for this reason, and patient is having uncontrolled acid reflux without her medication. Please contact patient back asap at her primary number, as well. Thank you.  Copied from CRM [id number]. Topic: Clinical - Red Word Triage >> Dec 20, 2023  4:48 PM Fuller Mandril wrote: Red Word that prompted transfer to Nurse Triage: Severe problems - acid reflux - has been without medication. Clerical error between provider and pharmacy [facility]. Reason for Disposition  Caller requesting an appointment, triage offered and declined  Answer Assessment - Initial Assessment Questions 1. REASON FOR CALL or QUESTION: "What is your reason for calling today?" or "How can I best help you?" or "What question do you have that I can help answer?"     Patient would like PCP office to call [facility] directly. See notes 2. CALLER: Document the source of call. (e.g., laboratory, patient).     Patient  Protocols used: PCP Call - No Triage-A-AH

## 2023-12-21 ENCOUNTER — Other Ambulatory Visit: Payer: Self-pay

## 2023-12-21 DIAGNOSIS — J45909 Unspecified asthma, uncomplicated: Secondary | ICD-10-CM

## 2023-12-21 MED ORDER — MONTELUKAST SODIUM 10 MG PO TABS: ORAL_TABLET | ORAL | 3 refills | Status: DC

## 2024-02-27 ENCOUNTER — Other Ambulatory Visit: Payer: Self-pay

## 2024-02-27 DIAGNOSIS — F419 Anxiety disorder, unspecified: Secondary | ICD-10-CM

## 2024-02-27 MED ORDER — BUSPIRONE HCL 5 MG PO TABS: 5.0000 mg | ORAL_TABLET | Freq: Every day | ORAL | 1 refills | Status: AC

## 2024-04-08 ENCOUNTER — Other Ambulatory Visit: Payer: Self-pay | Admitting: Internal Medicine

## 2024-04-08 DIAGNOSIS — J4540 Moderate persistent asthma, uncomplicated: Secondary | ICD-10-CM

## 2024-04-15 ENCOUNTER — Other Ambulatory Visit: Payer: Self-pay | Admitting: Nurse Practitioner

## 2024-04-15 DIAGNOSIS — D509 Iron deficiency anemia, unspecified: Secondary | ICD-10-CM

## 2024-04-25 ENCOUNTER — Encounter: Payer: Self-pay | Admitting: Nurse Practitioner

## 2024-04-25 ENCOUNTER — Other Ambulatory Visit: Payer: Self-pay

## 2024-04-25 DIAGNOSIS — J45909 Unspecified asthma, uncomplicated: Secondary | ICD-10-CM

## 2024-04-25 MED ORDER — MONTELUKAST SODIUM 10 MG PO TABS: ORAL_TABLET | ORAL | 3 refills | Status: AC

## 2024-07-08 ENCOUNTER — Ambulatory Visit (INDEPENDENT_AMBULATORY_CARE_PROVIDER_SITE_OTHER): Payer: Commercial Managed Care - PPO | Admitting: Nurse Practitioner

## 2024-07-08 ENCOUNTER — Encounter: Payer: Self-pay | Admitting: Nurse Practitioner

## 2024-07-08 VITALS — BP 114/60 | HR 80 | Temp 98.7°F | Ht 66.0 in | Wt 277.0 lb

## 2024-07-08 DIAGNOSIS — D509 Iron deficiency anemia, unspecified: Secondary | ICD-10-CM

## 2024-07-08 DIAGNOSIS — Z23 Encounter for immunization: Secondary | ICD-10-CM | POA: Insufficient documentation

## 2024-07-08 DIAGNOSIS — J45909 Unspecified asthma, uncomplicated: Secondary | ICD-10-CM

## 2024-07-08 DIAGNOSIS — F419 Anxiety disorder, unspecified: Secondary | ICD-10-CM | POA: Diagnosis not present

## 2024-07-08 DIAGNOSIS — K219 Gastro-esophageal reflux disease without esophagitis: Secondary | ICD-10-CM

## 2024-07-08 DIAGNOSIS — Z1231 Encounter for screening mammogram for malignant neoplasm of breast: Secondary | ICD-10-CM

## 2024-07-08 DIAGNOSIS — E66813 Obesity, class 3: Secondary | ICD-10-CM

## 2024-07-08 DIAGNOSIS — M329 Systemic lupus erythematosus, unspecified: Secondary | ICD-10-CM | POA: Insufficient documentation

## 2024-07-08 DIAGNOSIS — Z0000 Encounter for general adult medical examination without abnormal findings: Secondary | ICD-10-CM | POA: Diagnosis not present

## 2024-07-08 DIAGNOSIS — L2084 Intrinsic (allergic) eczema: Secondary | ICD-10-CM

## 2024-07-08 DIAGNOSIS — G4733 Obstructive sleep apnea (adult) (pediatric): Secondary | ICD-10-CM

## 2024-07-08 DIAGNOSIS — M3213 Lung involvement in systemic lupus erythematosus: Secondary | ICD-10-CM

## 2024-07-08 DIAGNOSIS — E782 Mixed hyperlipidemia: Secondary | ICD-10-CM | POA: Diagnosis not present

## 2024-07-08 DIAGNOSIS — Z6841 Body Mass Index (BMI) 40.0 and over, adult: Secondary | ICD-10-CM

## 2024-07-08 DIAGNOSIS — N939 Abnormal uterine and vaginal bleeding, unspecified: Secondary | ICD-10-CM

## 2024-07-08 DIAGNOSIS — E669 Obesity, unspecified: Secondary | ICD-10-CM | POA: Insufficient documentation

## 2024-07-08 DIAGNOSIS — Z79899 Other long term (current) drug therapy: Secondary | ICD-10-CM

## 2024-07-08 NOTE — Assessment & Plan Note (Signed)
 Routine wellness visit with significant weight loss through exercise. Discontinued semaglutide  due to cost and nausea. Discussed berberine for glucose control. - Continue current exercise regimen. - Consider over-the-counter berberine for glucose control.

## 2024-07-08 NOTE — Assessment & Plan Note (Signed)
 Continue CPAP and doing well. Has a better quality of life when wearing.

## 2024-07-08 NOTE — Assessment & Plan Note (Signed)
 Diagnosed with lupus, fibromyalgia, and connective tissue disorder. New rheumatologist referral needed. Discussed methotrexate for symptom management. - Refer to a new rheumatologist within the [facility]. - Obtain and send previous medical records to new rheumatologist.

## 2024-07-08 NOTE — Progress Notes (Signed)
 I,Jameka J Llittleton, CMA,acting as a Neurosurgeon for [employer], FNP.,have documented all relevant documentation on the behalf of Gaines Ada, FNP,as directed by  Gaines Ada, FNP while in the presence of Gaines Ada, FNP.  Subjective:    Patient ID: Jasmine Schultz , female    DOB: 02-Apr-1976 , 48 y.o.   MRN: [id number]  Chief Complaint  Patient presents with   Annual Exam    Patient presents today for HM, Patient reports compliance with medication. Patient denies any chest pain, SOB, or headaches. Patient has no concerns today.        HPI  Discussed the use of AI scribe software for clinical note transcription with the patient, who gave verbal consent to proceed.  History of Present Illness Jasmine Schultz is a 48 year old female with lupus, fibromyalgia, and connective tissue disorder who presents for follow-up on her chronic conditions.  She has been experiencing prolonged menstrual cycles, with the current cycle lasting two weeks and four days, and previous cycles extending up to a month. She has a history of heavy and prolonged periods and has not seen a gynecologist in over a year.  She reports increased arthritis pain and more frequent asthma attacks this year. She is scheduled to see her pulmonologist next week. Her previous rheumatologist, Dr. Creston, has retired, and she is seeking a new rheumatologist within the [facility].  She regularly uses a CPAP machine and cannot sleep without it. She has not had a mammogram since the last one ordered by her provider.  She has lost over 60 pounds through walking and using gym equipment at home. She previously used compounded semaglutide  shots for five months, which aided in weight loss but caused nausea and were too expensive to continue. There have been no changes in her diet or food intake during this period.  Her eczema on her feet remains problematic, causing significant discomfort and limiting her ability to wear shoes. She is  under the care of a dermatologist but has not been able to discuss internal treatment options due to insurance issues. She uses ointments but has not been prescribed oral medications.  She experiences joint instability, with joints frequently 'sliding out,' attributed to her connective tissue disorder. She has been told she is double-jointed, and this has been a lifelong issue.  She experiences chest pain and shortness of breath associated with her asthma. She also reports constipation, particularly during her menstrual cycle.   Past Medical History:  Diagnosis Date   Anxiety attack    Arthritis    Asthma    diagnosed in march 2019   Bursitis    Chronically dry eyes    GERD (gastroesophageal reflux disease)    IBS (irritable bowel syndrome)    Lupus    Migraine    Multiple allergies    Sun allergy    Tendonitis      Family History  Problem Relation Age of Onset   Asthma Mother    Hypertension Mother    CAD Other    CVA Other    Aneurysm Other        brain aneurysm multiple family members   Breast cancer Other        grandmother   Sickle cell anemia Other        maternal cousin   Aneurysm Maternal Aunt    Diabetes Maternal Aunt    Diabetes Maternal Uncle    Diabetes Paternal Aunt    Heart disease Paternal  Uncle    Diabetes Paternal Uncle    Breast cancer Maternal Grandmother    Diabetes Maternal Grandmother    Heart disease Maternal Grandfather    Aneurysm Maternal Grandfather    Diabetes Maternal Grandfather    Diabetes Paternal Grandmother    Heart disease Paternal Grandfather    Diabetes Paternal Grandfather      Current Outpatient Medications:    albuterol  (VENTOLIN  HFA) 108 (90 Base) MCG/ACT inhaler, Inhale 2 puffs into the lungs every 6 (six) hours as needed for wheezing or shortness of breath., Disp: 18 g, Rfl: 10   ALPRAZolam  (XANAX ) 0.25 MG tablet, Take 1 tablet (0.25 mg total) by mouth 3 (three) times daily as needed for anxiety., Disp: 20 tablet, Rfl:  0   Ascorbic Acid (VITAMIN C-ROSE HIPS PO), Take 2,000 mg by mouth daily. With Zinc, Disp: , Rfl:    benzonatate  (TESSALON ) 100 MG capsule, Take 100 mg by mouth 3 (three) times daily as needed for cough., Disp: , Rfl:    budesonide -formoterol  (SYMBICORT ) 80-4.5 MCG/ACT inhaler, Inhale 2 puffs into the lungs in the morning and at bedtime. (Patient taking differently: Inhale 2 puffs into the lungs daily as needed (use if 160-4.5 is to strong /Asthma).), Disp: 3 each, Rfl: 2   busPIRone  (BUSPAR ) 5 MG tablet, Take 1 tablet (5 mg total) by mouth daily., Disp: 90 tablet, Rfl: 1   Cholecalciferol (VITAMIN D-3) 125 MCG (5000 UT) TABS, Take 15,000 Units by mouth daily., Disp: , Rfl:    clobetasol  ointment (TEMOVATE ) 0.05 %, Apply 1 Application topically 2 (two) times daily., Disp: 60 g, Rfl: 5   EPINEPHrine  0.3 mg/0.3 mL IJ SOAJ injection, Inject 0.3 mg into the muscle as needed for anaphylaxis., Disp: 1 each, Rfl: 5   fluticasone  (FLONASE ) 50 MCG/ACT nasal spray, Place 2 sprays into both nostrils 2 (two) times daily., Disp: 18.2 mL, Rfl: 1   hydroxychloroquine  (PLAQUENIL ) 200 MG tablet, Take 200 mg by mouth 2 (two) times daily., Disp: , Rfl:    Iron-FA-B Cmp-C-Biot-Probiotic (FUSION PLUS) CAPS, TAKE 1 CAPSULE BY MOUTH DAILY, Disp: 30 capsule, Rfl: 2   levocetirizine (XYZAL ) 5 MG tablet, Take 1 tablet (5 mg total) by mouth every evening., Disp: 30 tablet, Rfl: 10   montelukast  (SINGULAIR ) 10 MG tablet, TAKE 1 TABLET BY MOUTH AT  BEDTIME, Disp: 90 tablet, Rfl: 3   Multiple Vitamin (MULTIVITAMIN ADULT PO), Take 1 tablet by mouth daily at 6 (six) AM. Vita fusion/Woman's, Disp: , Rfl:    ondansetron  (ZOFRAN  ODT) 4 MG disintegrating tablet, Take 1 tablet (4 mg total) by mouth every 8 (eight) hours as needed., Disp: 20 tablet, Rfl: 0   pantoprazole  (PROTONIX ) 40 MG tablet, Take 1 tablet (40 mg total) by mouth 2 (two) times daily., Disp: 180 tablet, Rfl: 3   Polyethyl Glycol-Propyl Glycol (SYSTANE OP), Place 2 drops  into both ears daily as needed (Cronic Dry eye)., Disp: , Rfl:    SYMBICORT  160-4.5 MCG/ACT inhaler, USE 2 INHALATIONS BY MOUTH TWICE DAILY, Disp: 30.6 g, Rfl: 3   triamcinolone ointment (KENALOG) 0.1 %, Apply 1 Application topically 2 (two) times daily as needed (dermatitis/ Eczema)., Disp: , Rfl:    Allergies  Allergen Reactions   Chocolate Anaphylaxis    Mild Anaphylaxis   Cocoa Anaphylaxis    Mild Anaphylaxis   Peanut Butter Flavoring Agent (Non-Screening) Anaphylaxis    Feels congested   Seasonal Ic [Octacosanol] Shortness Of Breath    Trees/ pollen   Tomato Anaphylaxis and Rash  Bacitracin-Polymyxin B Other (See Comments)    Band aids cause- dark spots on her skin and itching/swelling in contact area   Other Other (See Comments)    Sun Blister peel off after a month and leaves scars / skin get hot and really swell up Causes Eczema and Psoriasis  Tar- Throat close and Anaphylaxis  Cigarettes Smoke- Throat close and Anaphylaxis    Oysters [Shellfish Allergy] Hives   Sulfa Antibiotics Hives   Balsam Rash    Positive Patch Test 11/11/21   Iodopropynyl Rash    Positive Patch Test 11/11/21   Quaternium-15 Rash    Positive Patch Test 11/11/21     The patient states she uses none for birth control. Patient's last menstrual period was 06/28/2024.Negative for Dysmenorrhea and Negative for Menorrhagia. Negative for: breast discharge, breast lump(s), breast pain and breast self exam. Associated symptoms include abnormal vaginal bleeding. Pertinent negatives include abnormal bleeding (hematology), anxiety, decreased libido, depression, difficulty falling sleep, dyspareunia, history of infertility, nocturia, sexual dysfunction, sleep disturbances, urinary incontinence, urinary urgency, vaginal discharge and vaginal itching. Diet regular.The patient states her exercise level is    The patient's tobacco use is:  Social History   Tobacco Use  Smoking Status Never  Smokeless Tobacco Never    She has been exposed to passive smoke. The patient's alcohol  use is:  Social History   Substance and Sexual Activity  Alcohol  Use Yes   Alcohol /week: 0.0 standard drinks of alcohol    Comment: rarley   Additional information: Last pap 06/30/2022, next one scheduled for 9/21/206.    Review of Systems  Constitutional: Negative.   HENT: Negative.    Eyes: Negative.   Respiratory: Negative.    Cardiovascular: Negative.   Gastrointestinal: Negative.   Endocrine: Negative.   Genitourinary: Negative.   Musculoskeletal: Negative.   Skin: Negative.   Allergic/Immunologic: Negative.   Neurological: Negative.   Hematological: Negative.   Psychiatric/Behavioral: Negative.       Today's Vitals   07/08/24 0945  BP: 114/60  Pulse: 80  Temp: 98.7 F (37.1 C)  TempSrc: Oral  Weight: 277 lb (125.6 kg)  Height: 5' 6 (1.676 m)  PainSc: 4    Body mass index is 44.71 kg/m.  Wt Readings from Last 3 Encounters:  07/08/24 277 lb (125.6 kg)  07/21/23 (!) 334 lb (151.5 kg)  07/05/23 (!) 335 lb 6.4 oz (152.1 kg)     Objective:  Physical Exam Vitals and nursing note reviewed.  Constitutional:      General: She is not in acute distress.    Appearance: Normal appearance. She is well-developed. She is obese.  HENT:     Head: Normocephalic and atraumatic.     Right Ear: Hearing, tympanic membrane, ear canal and external ear normal. There is no impacted cerumen.     Left Ear: Hearing, tympanic membrane, ear canal and external ear normal. There is no impacted cerumen.     Nose: Nose normal.     Mouth/Throat:     Mouth: Mucous membranes are moist.  Eyes:     General: Lids are normal.     Extraocular Movements: Extraocular movements intact.     Conjunctiva/sclera: Conjunctivae normal.     Pupils: Pupils are equal, round, and reactive to light.     Funduscopic exam:    Right eye: No papilledema.        Left eye: No papilledema.  Neck:     Thyroid : No thyroid  mass.     Vascular: No  carotid bruit.  Cardiovascular:     Rate and Rhythm: Normal rate and regular rhythm.     Pulses: Normal pulses.     Heart sounds: Normal heart sounds. No murmur heard. Pulmonary:     Effort: Pulmonary effort is normal. No respiratory distress.     Breath sounds: Normal breath sounds. No wheezing.  Chest:     Chest wall: No mass.  Breasts:    Tanner Score is 5.     Right: Normal. No mass or tenderness.     Left: Normal. No mass or tenderness.  Abdominal:     General: Abdomen is flat. Bowel sounds are normal. There is no distension.     Palpations: Abdomen is soft.     Tenderness: There is no abdominal tenderness.  Musculoskeletal:        General: No swelling. Normal range of motion.     Cervical back: Full passive range of motion without pain, normal range of motion and neck supple.     Right lower leg: No edema.     Left lower leg: No edema.  Lymphadenopathy:     Upper Body:     Right upper body: No supraclavicular, axillary or pectoral adenopathy.     Left upper body: No supraclavicular, axillary or pectoral adenopathy.  Skin:    General: Skin is warm and dry.     Capillary Refill: Capillary refill takes less than 2 seconds.     Comments: alopecia  Neurological:     General: No focal deficit present.     Mental Status: She is alert and oriented to person, place, and time.     Cranial Nerves: No cranial nerve deficit.     Sensory: No sensory deficit.  Psychiatric:        Mood and Affect: Mood normal.        Behavior: Behavior normal.        Thought Content: Thought content normal.        Judgment: Judgment normal.      Assessment And Plan:     Encounter for annual health examination Assessment & Plan: Routine wellness visit with significant weight loss through exercise. Discontinued semaglutide  due to cost and nausea. Discussed berberine for glucose control. - Continue current exercise regimen. - Consider over-the-counter berberine for glucose control.   Class 3  severe obesity due to excess calories with body mass index (BMI) of 40.0 to 44.9 in adult, unspecified whether serious comorbidity present Assessment & Plan: Class 3 obesity with weight loss from exercise. Discontinued semaglutide  due to cost and side effects. Discussed cost-effectiveness and FDA approval of compounded semaglutide  versus brand-name options. - Continue current exercise regimen. - Consider over-the-counter berberine for glucose control. - She is encouraged to strive for BMI less than 30 to decrease cardiac risk.  - Advised to aim for at least 150 minutes of exercise per week. - Congratulated on her 60 lb weight loss.     Need for influenza vaccination Assessment & Plan: Influenza vaccine administered Encouraged to take Tylenol  as needed for fever or muscle aches.   Orders: -     Flu vaccine trivalent PF, 6mos and older(Flulaval,Afluria,Fluarix,Fluzone)  Need for pneumococcal 20-valent conjugate vaccination -     Pneumococcal conjugate vaccine 20-valent  Encounter for screening mammogram for breast cancer -     3D Screening Mammogram, Left and Right; Future  Mixed hyperlipidemia -     CMP14+EGFR -     Lipid panel  Mild asthma without complication, unspecified  whether persistent Assessment & Plan: Increased asthma attacks this year. Pulmonologist follow-up scheduled in the next two weeks - Follow up with pulmonologist.   Iron deficiency anemia, unspecified iron deficiency anemia type Assessment & Plan: Will check iron levels  Orders: -     Iron, TIBC and Ferritin Panel  Anxiety  Chronic GERD  Other long term (current) drug therapy -     CBC  Intrinsic eczema Assessment & Plan: Chronic foot eczema with insufficient ointment treatment. Insurance issues delayed dermatological evaluation. Discussed short-term oral steroids for severe flare-ups. - Consider short-term oral steroids for severe flare-ups.   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 New rheumatologist referral needed. Discussed methotrexate for symptom management. - Refer to a new rheumatologist within the [facility]. - Obtain and send previous medical records to new rheumatologist.  Orders: -     Ambulatory referral to Rheumatology  Abnormal uterine bleeding Assessment & Plan: Prolonged heavy menstrual cycles. Previous gynecological evaluation unclear to her. - Contact gynecologist for follow-up appointment, she was seen in the last year and should be an established patient.   OSA on CPAP Assessment & Plan: Continue CPAP and doing well. Has a better quality of life when wearing.      Return for 1 year physical. Patient was given opportunity to ask questions. Patient verbalized understanding of the plan and was able to repeat key elements of the plan. All questions were answered to their satisfaction.   Gaines Ada, FNP  I, Gaines Ada, FNP, have reviewed all documentation for this visit. The documentation on 07/08/24 for the exam, diagnosis, procedures, and orders are all accurate and complete.

## 2024-07-08 NOTE — Assessment & Plan Note (Signed)
 Prolonged heavy menstrual cycles. Previous gynecological evaluation unclear to her. - Contact gynecologist for follow-up appointment, she was seen in the last year and should be an established patient.

## 2024-07-08 NOTE — Assessment & Plan Note (Signed)
 Influenza vaccine administered Encouraged to take Tylenol as needed for fever or muscle aches.

## 2024-07-08 NOTE — Patient Instructions (Addendum)
 Health Maintenance, Female Adopting a healthy lifestyle and getting preventive care are important in promoting health and wellness. Ask your health care provider about: The right schedule for you to have regular tests and exams. Things you can do on your own to prevent diseases and keep yourself healthy. What should I know about diet, weight, and exercise? Eat a healthy diet  Eat a diet that includes plenty of vegetables, fruits, low-fat dairy products, and lean protein. Do not eat a lot of foods that are high in solid fats, added sugars, or sodium. Maintain a healthy weight Body mass index (BMI) is used to identify weight problems. It estimates body fat based on height and weight. Your health care provider can help determine your BMI and help you achieve or maintain a healthy weight. Get regular exercise Get regular exercise. This is one of the most important things you can do for your health. Most adults should: Exercise for at least 150 minutes each week. The exercise should increase your heart rate and make you sweat (moderate-intensity exercise). Do strengthening exercises at least twice a week. This is in addition to the moderate-intensity exercise. Spend less time sitting. Even light physical activity can be beneficial. Watch cholesterol and blood lipids Have your blood tested for lipids and cholesterol at 50 years of age, then have this test every 5 years. Have your cholesterol levels checked more often if: Your lipid or cholesterol levels are high. You are older than 50 years of age. You are at high risk for heart disease. What should I know about cancer screening? Depending on your health history and family history, you may need to have cancer screening at various ages. This may include screening for: Breast cancer. Cervical cancer. Colorectal cancer. Skin cancer. Lung cancer. What should I know about heart disease, diabetes, and high blood pressure? Blood pressure and heart  disease High blood pressure causes heart disease and increases the risk of stroke. This is more likely to develop in people who have high blood pressure readings or are overweight. Have your blood pressure checked: Every 3-5 years if you are 102-36 years of age. Every year if you are 13 years old or older. Diabetes Have regular diabetes screenings. This checks your fasting blood sugar level. Have the screening done: Once every three years after age 36 if you are at a normal weight and have a low risk for diabetes. More often and at a younger age if you are overweight or have a high risk for diabetes. What should I know about preventing infection? Hepatitis B If you have a higher risk for hepatitis B, you should be screened for this virus. Talk with your health care provider to find out if you are at risk for hepatitis B infection. Hepatitis C Testing is recommended for: Everyone born from [birth year] through 1967. Anyone with known risk factors for hepatitis C. Sexually transmitted infections (STIs) Get screened for STIs, including gonorrhea and chlamydia, if: You are sexually active and are younger than 50 years of age. You are older than 50 years of age and your health care provider tells you that you are at risk for this type of infection. Your sexual activity has changed since you were last screened, and you are at increased risk for chlamydia or gonorrhea. Ask your health care provider if you are at risk. Ask your health care provider about whether you are at high risk for HIV. Your health care provider may recommend a prescription medicine to help prevent HIV  infection. If you choose to take medicine to prevent HIV, you should first get tested for HIV. You should then be tested every 3 months for as long as you are taking the medicine. Pregnancy If you are about to stop having your period (premenopausal) and you may become pregnant, seek counseling before you get pregnant. Take 400 to 800  micrograms (mcg) of folic acid every day if you become pregnant. Ask for birth control (contraception) if you want to prevent pregnancy. Osteoporosis and menopause Osteoporosis is a disease in which the bones lose minerals and strength with aging. This can result in bone fractures. If you are 10 years old or older, or if you are at risk for osteoporosis and fractures, ask your health care provider if you should: Be screened for bone loss. Take a calcium or vitamin D supplement to lower your risk of fractures. Be given hormone replacement therapy (HRT) to treat symptoms of menopause. Follow these instructions at home: Alcohol  use Do not drink alcohol  if: Your health care provider tells you not to drink. You are pregnant, may be pregnant, or are planning to become pregnant. If you drink alcohol : Limit how much you have to: 0-1 drink a day. Know how much alcohol  is in your drink. In the [place] one drink equals one 12 oz bottle of beer (355 mL), one 5 oz glass of wine (148 mL), or one 1 oz glass of hard liquor (44 mL). Lifestyle Do not use any products that contain nicotine or tobacco. These products include cigarettes, chewing tobacco, and vaping devices, such as e-cigarettes. If you need help quitting, ask your health care provider. Do not use street drugs. Do not share needles. Ask your health care provider for help if you need support or information about quitting drugs. General instructions Schedule regular health, dental, and eye exams. Stay current with your vaccines. Tell your health care provider if: You often feel depressed. You have ever been abused or do not feel safe at home. Summary Adopting a healthy lifestyle and getting preventive care are important in promoting health and wellness. Follow your health care provider's instructions about healthy diet, exercising, and getting tested or screened for diseases. Follow your health care provider's instructions on monitoring your  cholesterol and blood pressure. This information is not intended to replace advice given to you by your health care provider. Make sure you discuss any questions you have with your health care provider. Take over the counter berberine to help with glucose control Document Revised: 02/15/2021 Document Reviewed: 02/15/2021 Elsevier Patient Education  2024 [employer].

## 2024-07-08 NOTE — Assessment & Plan Note (Signed)
 Chronic foot eczema with insufficient ointment treatment. Insurance issues delayed dermatological evaluation. Discussed short-term oral steroids for severe flare-ups. - Consider short-term oral steroids for severe flare-ups.

## 2024-07-08 NOTE — Assessment & Plan Note (Signed)
 Increased asthma attacks this year. Pulmonologist follow-up scheduled in the next two weeks - Follow up with pulmonologist.

## 2024-07-08 NOTE — Assessment & Plan Note (Signed)
 Will check iron levels.

## 2024-07-08 NOTE — Assessment & Plan Note (Signed)
 Class 3 obesity with weight loss from exercise. Discontinued semaglutide  due to cost and side effects. Discussed cost-effectiveness and FDA approval of compounded semaglutide  versus brand-name options. - Continue current exercise regimen. - Consider over-the-counter berberine for glucose control. - She is encouraged to strive for BMI less than 30 to decrease cardiac risk.  - Advised to aim for at least 150 minutes of exercise per week. - Congratulated on her 60 lb weight loss.

## 2024-07-09 LAB — CMP14+EGFR
ALT: 9 IU/L (ref 0–32)
AST: 16 IU/L (ref 0–40)
Albumin: 4.1 g/dL (ref 3.9–4.9)
Alkaline Phosphatase: 61 IU/L (ref 41–116)
BUN/Creatinine Ratio: 13 (ref 9–23)
BUN: 10 mg/dL (ref 6–24)
Bilirubin Total: 0.3 mg/dL (ref 0.0–1.2)
CO2: 21 mmol/L (ref 20–29)
Calcium: 9.6 mg/dL (ref 8.7–10.2)
Chloride: 108 mmol/L — ABNORMAL HIGH (ref 96–106)
Creatinine, Ser: 0.8 mg/dL (ref 0.57–1.00)
Globulin, Total: 2.4 g/dL (ref 1.5–4.5)
Glucose: 76 mg/dL (ref 70–99)
Potassium: 5.2 mmol/L (ref 3.5–5.2)
Sodium: 141 mmol/L (ref 134–144)
Total Protein: 6.5 g/dL (ref 6.0–8.5)
eGFR: 90 mL/min/1.73 (ref >59)

## 2024-07-09 LAB — LIPID PANEL
Chol/HDL Ratio: 2.8 ratio (ref 0.0–4.4)
Cholesterol, Total: 212 mg/dL — ABNORMAL HIGH (ref 100–199)
HDL: 76 mg/dL (ref >39)
LDL Chol Calc (NIH): 129 mg/dL — ABNORMAL HIGH (ref 0–99)
Triglycerides: 38 mg/dL (ref 0–149)
VLDL Cholesterol Cal: 7 mg/dL (ref 5–40)

## 2024-07-09 LAB — CBC
Hematocrit: 32.9 % — ABNORMAL LOW (ref 34.0–46.6)
Hemoglobin: 10.3 g/dL — ABNORMAL LOW (ref 11.1–15.9)
MCH: 28.2 pg (ref 26.6–33.0)
MCHC: 31.3 g/dL — ABNORMAL LOW (ref 31.5–35.7)
MCV: 90 fL (ref 79–97)
Platelets: 467 x10E3/uL — ABNORMAL HIGH (ref 150–450)
RBC: 3.65 x10E6/uL — ABNORMAL LOW (ref 3.77–5.28)
RDW: 12.8 % (ref 11.7–15.4)
WBC: 3.9 x10E3/uL (ref 3.4–10.8)

## 2024-07-09 LAB — IRON,TIBC AND FERRITIN PANEL
Ferritin: 20 ng/mL (ref 15–150)
Iron Saturation: 35 % (ref 15–55)
Iron: 114 ug/dL (ref 27–159)
Total Iron Binding Capacity: 322 ug/dL (ref 250–450)
UIBC: 208 ug/dL (ref 131–425)

## 2024-07-18 ENCOUNTER — Ambulatory Visit: Admitting: Internal Medicine

## 2024-07-18 ENCOUNTER — Encounter: Payer: Self-pay | Admitting: Internal Medicine

## 2024-07-18 VITALS — BP 140/80 | HR 73 | Temp 99.1°F | Ht 66.0 in | Wt 274.6 lb

## 2024-07-18 DIAGNOSIS — J4530 Mild persistent asthma, uncomplicated: Secondary | ICD-10-CM | POA: Diagnosis not present

## 2024-07-18 DIAGNOSIS — G4733 Obstructive sleep apnea (adult) (pediatric): Secondary | ICD-10-CM

## 2024-07-18 DIAGNOSIS — E669 Obesity, unspecified: Secondary | ICD-10-CM | POA: Diagnosis not present

## 2024-07-18 DIAGNOSIS — J4540 Moderate persistent asthma, uncomplicated: Secondary | ICD-10-CM

## 2024-07-18 MED ORDER — BUDESONIDE-FORMOTEROL FUMARATE 160-4.5 MCG/ACT IN AERO: 2.0000 | INHALATION_SPRAY | Freq: Two times a day (BID) | RESPIRATORY_TRACT | 6 refills | Status: AC

## 2024-07-18 MED ORDER — BUDESONIDE-FORMOTEROL FUMARATE 80-4.5 MCG/ACT IN AERO
2.0000 | INHALATION_SPRAY | Freq: Every day | RESPIRATORY_TRACT | 6 refills | Status: AC | PRN
Start: 2024-07-18 — End: ?

## 2024-07-18 NOTE — Progress Notes (Signed)
 PULMONARY OFFICE FOLLOW UP NOTE   [facility] Pulmonary Medicine Consultation     PT PROFILE: 48 y.o. female never smoker admitted to [facility] 8/8-8/14/19 with status asthmaticus. Intubated in ED and briefly required epinephrine  an ketamine  infusions. Self extubated on 8/11.   DATA: 05/19/18 CTA chest: no acute findings except mild dependent atelectasis 08/07/2018 PFTs: FVC: 2.92 > 2.98 L (91 > 93 %pred), FEV1: 2.30 > 2.47 L (88 > 95% pred), FEV1/FVC: 79%, TLC: 4.5 to L (86 %pred), DLCO 78 %pred 08/2018 IgE levels 135, EOS 400   10/2018 HST AHI: 78.5 / h  INTERVAL: Last visit 08/16/2018.  In interim, diagnosed with OSA [facility] neurology).  No major pulmonary events  CC Follow up assessment opf ASTHMA Follow up assessment of OSA   HPI Patient has a diagnosis of asthma Previous ventilatory support in August 2019 Patient continues to take Symbicort  and albuterol  as needed Patient is prednisone  and antibiotic responsive  Previous diagnosis of severe sleep apnea AHI of 78 Assessment of OSA Continue CPAP as prescribed  Excellent compliance report Reviewed compliance report in detail with patient Patient definitely benefits the use of CPAP therapy as prescribed Using CPAP nightly and with naps Pressure setting is comfortable and is sleeping well. CPAP prescription 5-18 AHI reduced to 0.5  No evidence of acute heart failure at this time No respiratory distress No fevers, chills, nausea, vomiting, diarrhea No evidence hemoptysis    +exposure to carts, chest heaviness and wheezing Advised to avoid cats  No exacerbation at this time No evidence of heart failure at this time No evidence or signs of infection at this time No respiratory distress No fevers, chills, nausea, vomiting, diarrhea No evidence of lower extremity edema No evidence hemoptysis  BP (!) 140/80   Pulse 73   Temp 99.1 F (37.3 C)   Ht 5' 6 (1.676 m)   Wt 274 lb 9.6 oz (124.6 kg)   LMP  06/28/2024   SpO2 100%   BMI 44.32 kg/m      Review of Systems: Gen:  Denies  fever, sweats, chills weight loss  HEENT: Denies blurred vision, double vision, ear pain, eye pain, hearing loss, nose bleeds, sore throat Cardiac:  No dizziness, chest pain or heaviness, chest tightness,edema, No JVD Resp:   No cough, -sputum production, -shortness of breath,-wheezing, -hemoptysis,  Other:  All other systems negative   Physical Examination:   General Appearance: No distress  EYES PERRLA, EOM intact.   NECK Supple, No JVD Pulmonary: normal breath sounds, No wheezing.  CardiovascularNormal S1,S2.  No m/r/g.   Abdomen: Benign, Soft, non-tender. Neurology UE/LE 5/5 strength, no focal deficits Ext pulses intact, cap refill intact ALL OTHER ROS ARE NEGATIVE    DATA:      Latest Ref Rng & Units 07/08/2024   11:06 AM 07/05/2023   11:40 AM 12/29/2022   10:42 AM  BMP  Glucose 70 - 99 mg/dL 76  85  82   BUN 6 - 24 mg/dL 10  9  5    Creatinine 0.57 - 1.00 mg/dL 9.19  9.17  9.24   BUN/Creat Ratio 9 - 23 13  11  7    Sodium 134 - 144 mmol/L 141  140  138   Potassium 3.5 - 5.2 mmol/L 5.2  4.5  4.9   Chloride 96 - 106 mmol/L 108  104  102   CO2 20 - 29 mmol/L 21  24  22    Calcium 8.7 - 10.2 mg/dL 9.6  9.8  9.7        Latest Ref Rng & Units 07/08/2024   11:06 AM 07/05/2023   11:40 AM 12/29/2022   10:42 AM  CBC  WBC 3.4 - 10.8 x10E3/uL 3.9  5.3  4.1   Hemoglobin 11.1 - 15.9 g/dL 89.6  9.2  87.9   Hematocrit 34.0 - 46.6 % 32.9  30.4  37.9   Platelets 150 - 450 x10E3/uL 467  454  460      ASESSMENT AND PLAN 48 yo pleasant AAF with underlying severe OSA with AHI 78 with underlying DX of ASTHMA   Assessment of OSA Previous AHI 78 Continue CPAP as prescribed  Excellent compliance report Reviewed compliance report in detail with patient Patient definitely benefits the use of CPAP therapy as prescribed Using CPAP nightly and with naps Pressure setting is comfortable and is sleeping  well. CPAP prescription 5-18 AHI reduced to 0.5  No evidence of acute heart failure at this time No respiratory distress No fevers, chills, nausea, vomiting, diarrhea No evidence hemoptysis  Patient Instructions Continue to use CPAP every night, minimum of 4-6 hours a night.  Change equipment every 30 days or as directed by DME.  Wash your tubing with warm soap and water  daily, hang to dry. Wash humidifier portion weekly. Use bottled, distilled water  and change daily   Be aware of reduced alertness and do not drive or operate heavy machinery if experiencing this or drowsiness.  Exercise encouraged, as tolerated. Encouraged proper weight management.  Important to get eight or more hours of sleep  Limiting the use of the computer and television before bedtime.  Decrease naps during the day, so night time sleep will become enhanced.  Limit caffeine, and sleep deprivation.  HTN, stroke, uncontrolled diabetes and heart failure are potential risk factors.  Risk of untreated sleep apnea including cardiac arrhthymias, stroke, DM, pulm HTN.    Mild to moderate persistent asthma  Continue inhalers as prescribed Continue Symbicort  as prescribed Albuterol  as needed Avoid allergens Patient is allergic to cats patient advised to avoid cats No exacerbation at this time No evidence of heart failure at this time No evidence or signs of infection at this time No respiratory distress No fevers, chills, nausea, vomiting, diarrhea No evidence of lower extremity edema No evidence hemoptysis Avoid Allergens and Irritants Avoid secondhand smoke Avoid SICK contacts Recommend  Masking  when appropriate Recommend Keep up-to-date with vaccinations    Obesity -recommend significant weight loss -recommend changing diet  Deconditioned state -Recommend increased daily activity and exercise    MEDICATION ADJUSTMENTS/LABS AND TESTS ORDERED: Continue CPAP Continue Symbicort  as prescribed Avoid  Triggers and cats Patient would like a few doses of Symbicort  to toggle between each dosage based on her symptoms   MEDICATION ADJUSTMENTS/LABS AND TESTS ORDERED:    CURRENT MEDICATIONS REVIEWED AT LENGTH WITH PATIENT TODAY   Patient  satisfied with Plan of action and management. All questions answered   Follow up 1 year   I spent a total of 47 minutes dedicated to the care of this patient on the date of this encounter to include pre-visit review of records, face-to-face time with the patient discussing conditions above, post visit ordering of testing, clinical documentation with the electronic health record, making appropriate referrals as documented, and communicating necessary information to the patient's healthcare team.    The Patient requires high complexity decision making for assessment and support, frequent evaluation and titration of therapies, application of advanced monitoring technologies and extensive interpretation of  multiple databases.  Patient satisfied with Plan of action and management. All questions answered    Nickolas Alm Cellar, M.D.  [facility] Pulmonary & Critical Care Medicine  Medical Director [facility]

## 2024-07-18 NOTE — Patient Instructions (Signed)
 Excellent Job A+ GOLD STAR!!  Continue CPAP as prescribed  Patient Instructions Continue to use CPAP every night, minimum of 4-6 hours a night.  Change equipment every 30 days or as directed by DME.  Wash your tubing with warm soap and water daily, hang to dry. Wash humidifier portion weekly. Use bottled, distilled water and change daily   Be aware of reduced alertness and do not drive or operate heavy machinery if experiencing this or drowsiness.  Exercise encouraged, as tolerated. Encouraged proper weight management.  Important to get eight or more hours of sleep  Limiting the use of the computer and television before bedtime.  Decrease naps during the day, so night time sleep will become enhanced.  Limit caffeine, and sleep deprivation.    Avoid Allergens and Irritants Avoid secondhand smoke Avoid SICK contacts Recommend  Masking  when appropriate Recommend Keep up-to-date with vaccinations  Continue inhalers as prescribed

## 2024-07-22 ENCOUNTER — Other Ambulatory Visit: Payer: Self-pay | Admitting: Nurse Practitioner

## 2024-07-22 DIAGNOSIS — D509 Iron deficiency anemia, unspecified: Secondary | ICD-10-CM

## 2024-08-02 ENCOUNTER — Other Ambulatory Visit: Payer: Self-pay | Admitting: Internal Medicine

## 2024-08-02 ENCOUNTER — Telehealth: Payer: Self-pay

## 2024-08-02 DIAGNOSIS — J40 Bronchitis, not specified as acute or chronic: Secondary | ICD-10-CM

## 2024-08-02 MED ORDER — PREDNISONE 10 MG PO TABS: 10.0000 mg | ORAL_TABLET | Freq: Every day | ORAL | 0 refills | Status: AC

## 2024-08-02 NOTE — Progress Notes (Unsigned)
 Pred 10 mg daily for 7 days Check COVID/FLU

## 2024-08-02 NOTE — Telephone Encounter (Signed)
 Copied from CRM [id number]. Topic: Clinical - Medical Advice >> Aug 02, 2024  2:17 PM Devaughn RAMAN wrote: Reason for CRM: Patient called and stated she was advised to call in if her cough and congestion got worst and Dr.Kasa would write her a prescription for prednisone , pt stated her congestion has gotten worst and she could utilize the prednisone . Pt confirmed pharmacy is [facility] on [address].

## 2024-09-02 ENCOUNTER — Encounter: Payer: Self-pay | Admitting: Licensed Practical Nurse

## 2024-09-02 ENCOUNTER — Ambulatory Visit: Admitting: Licensed Practical Nurse

## 2024-09-02 VITALS — BP 123/64 | HR 75 | Ht 66.0 in | Wt 272.6 lb

## 2024-09-02 DIAGNOSIS — Z01411 Encounter for gynecological examination (general) (routine) with abnormal findings: Secondary | ICD-10-CM | POA: Diagnosis not present

## 2024-09-02 DIAGNOSIS — N939 Abnormal uterine and vaginal bleeding, unspecified: Secondary | ICD-10-CM | POA: Diagnosis not present

## 2024-09-02 DIAGNOSIS — D219 Benign neoplasm of connective and other soft tissue, unspecified: Secondary | ICD-10-CM

## 2024-09-02 DIAGNOSIS — Z01419 Encounter for gynecological examination (general) (routine) without abnormal findings: Secondary | ICD-10-CM

## 2024-09-02 DIAGNOSIS — D259 Leiomyoma of uterus, unspecified: Secondary | ICD-10-CM | POA: Diagnosis not present

## 2024-09-02 DIAGNOSIS — N959 Unspecified menopausal and perimenopausal disorder: Secondary | ICD-10-CM

## 2024-09-02 DIAGNOSIS — N852 Hypertrophy of uterus: Secondary | ICD-10-CM

## 2024-09-02 NOTE — Patient Instructions (Addendum)
 [email or web address].[email or web address]/us [email or web address]  [email or web address]

## 2024-09-02 NOTE — Progress Notes (Signed)
 Gynecology Annual Exam  PCP: Georgina Speaks, FNP  Chief Complaint:  Chief Complaint  Patient presents with   Gynecologic Exam    History of Present Illness:Patient is a 48 y.o. G0P0000 presents for annual exam.   I don't know what to expect in regards to menopause,  I have long cycles with large blood clots, night sweats x 1 year,  I can be on for a month or more, it will stop and then start again, I have to keep pads at all times, needs to wear depends with pads. Since age 47 has had painful  cycles, I don't know what to do for it-I use heat, has bene advised to not use NSAIDS, can use Tylenol -it takes a few hours to give relief.   Her mother had a hysterectomy not sure why  Is not interested in HRT, is concerned the hormones in contraception is what caused her fibroids, she is also on a lit of medications therefore does not want to take any more medications, especially if there could be interactions.   LMP: Patient's last menstrual period was 08/08/2024 (approximate). Menarche:12 Average Interval: irregular,  Duration of flow: 8 days Heavy Menses: yes Clots: yes Intermenstrual Bleeding: yes Postcoital Bleeding: no Dysmenorrhea: yes  The patient is sexually active with one female. She does have dyspareunia-has been happening since her menopausal sxs started around age 88 , does not happen all of time, the pain is during IC .  The patient does perform self breast exams.  There is notable family history of breast or ovarian cancer in her family.  The patient wears seatbelts: yes.   The patient has regular exercise: yes.  Home gym, walking, has lost weight   The patient denies current symptoms of depression.   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  Lives with husband, feels safe  PCP Georgina Speaks Dentisit ; aw few week sago Wears glasses, last exam yearly  Denies tobaco, HTN,  Has Migraines with aura   Review of  Systems: ROS see HPI   Past Medical History:  Patient Active Problem List   Diagnosis Date Noted   Need for pneumococcal 20-valent conjugate vaccination 07/08/2024   Systemic lupus erythematosus (HCC) 07/08/2024   Obesity, unspecified 07/08/2024   Encounter for annual health examination 07/25/2023   Mixed hyperlipidemia 07/25/2023   Need for influenza vaccination 07/25/2023   Eczema 07/25/2023   Other long term (current) drug therapy 07/25/2023   Abnormal uterine bleeding 02/06/2023   Alopecia 12/29/2022   Encntr for general adult medical exam w/o abnormal findings 12/29/2022   Morbid (severe) obesity due to excess calories (HCC) 12/29/2022   Rash and other nonspecific skin eruption 12/29/2022   Arthritis 12/29/2022   Vaginitis 12/29/2022   Stress 12/29/2022   Abnormal menses 12/29/2022   Sinobronchitis 08/03/2022   Mass of right wrist 07/07/2021   OSA on CPAP 01/10/2019   Elevated platelet count 12/19/2018   Severe obstructive sleep apnea 11/12/2018   Nocturnal hypoxia 11/12/2018   Snoring 10/18/2018   Abnormal glucose 10/18/2018   Chronic GERD 07/11/2018   History of food allergy 07/09/2018   Asthma 06/27/2018   Allergic reaction 06/27/2018   Bilateral hearing loss 02/06/2017   Globus pharyngeus 02/06/2017   Gastroesophageal reflux disease without esophagitis 02/06/2017   Anemia, iron deficiency 06/06/2015   Reactive airway disease 06/05/2015   Dyspnea 06/05/2015   Nausea and vomiting 06/05/2015   Anxiety 06/05/2015  Perennial allergic rhinitis 06/05/2015    Past Surgical History:  Past Surgical History:  Procedure Laterality Date   BREAST REDUCTION SURGERY     COLONOSCOPY WITH PROPOFOL  N/A 04/15/2022   Procedure: COLONOSCOPY WITH PROPOFOL ;  Surgeon: Rollin Dover, MD;  Location: [facility];  Service: Gastroenterology;  Laterality: N/A;   POLYPECTOMY  04/15/2022   Procedure: POLYPECTOMY;  Surgeon: Rollin Dover, MD;  Location: [facility];  Service:  Gastroenterology;;   REDUCTION MAMMAPLASTY Bilateral    2002   TUBAL LIGATION      Gynecologic History:  Patient's last menstrual period was 08/08/2024 (approximate). Last Pap: Results were: 2019 no abnormalities  Last mammogram: 2023  Results were: BI-RAD I  Obstetric History: G0P0000  Family History:  Family History  Problem Relation Age of Onset   Asthma Mother    Hypertension Mother    CAD Other    CVA Other    Aneurysm Other        brain aneurysm multiple family members   Breast cancer Other        grandmother   Sickle cell anemia Other        maternal cousin   Aneurysm Maternal Aunt    Diabetes Maternal Aunt    Diabetes Maternal Uncle    Diabetes Paternal Aunt    Heart disease Paternal Uncle    Diabetes Paternal Uncle    Breast cancer Maternal Grandmother    Diabetes Maternal Grandmother    Heart disease Maternal Grandfather    Aneurysm Maternal Grandfather    Diabetes Maternal Grandfather    Diabetes Paternal Grandmother    Heart disease Paternal Grandfather    Diabetes Paternal Grandfather     Social History:  Social History   Socioeconomic History   Marital status: Married    Spouse name: Not on file   Number of children: Not on file   Years of education: Not on file   Highest education level: Not on file  Occupational History   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  Education college.  No children.     Social Drivers of Corporate Investment Banker Strain: Not on file  Food Insecurity: Not on file  Transportation Needs: Not on  file  Physical Activity: Not on file  Stress: Not on file  Social Connections: Not on file  Intimate Partner Violence: Not on file    Allergies:  Allergies  Allergen Reactions   Chocolate Anaphylaxis    Mild Anaphylaxis   Cocoa Anaphylaxis    Mild Anaphylaxis   Peanut Butter Flavoring Agent (Non-Screening) Anaphylaxis    Feels congested   Seasonal Ic [Octacosanol] Shortness Of Breath    Trees/ pollen   Tomato Anaphylaxis and Rash   Bacitracin-Polymyxin B Other (See Comments)    Band aids cause- dark spots on her skin and itching/swelling in contact area   Other Other (See Comments)    Sun Blister peel off after a month and leaves scars / skin get hot  and really swell up Causes Eczema and Psoriasis  Tar- Throat close and Anaphylaxis  Cigarettes Smoke- Throat close and Anaphylaxis    Oysters [Shellfish Allergy] Hives   Sulfa Antibiotics Hives   Balsam Rash    Positive Patch Test 11/11/21   Iodopropynyl Rash    Positive Patch Test 11/11/21   Quaternium-15 Rash    Positive Patch Test 11/11/21    Medications: Prior to Admission medications   Medication Sig Start Date End Date Taking? Authorizing Provider  albuterol  (VENTOLIN  HFA) 108 (90 Base) MCG/ACT inhaler Inhale 2 puffs into the lungs every 6 (six) hours as needed for wheezing or shortness of breath. 10/05/22   Moore, Janece, FNP  ALPRAZolam  (XANAX ) 0.25 MG tablet Take 1 tablet (0.25 mg total) by mouth 3 (three) times daily as needed for anxiety. 12/29/22   Georgina Speaks, FNP  Ascorbic Acid (VITAMIN C-ROSE HIPS PO) Take 2,000 mg by mouth daily. With Zinc    [provider]  benzonatate  (TESSALON ) 100 MG capsule Take 100 mg by mouth 3 (three) times daily as needed for cough.    [provider]  Biotin POWD Take 1 tablet by mouth. 02/06/17   [provider]  budesonide -formoterol  (SYMBICORT ) 160-4.5 MCG/ACT inhaler Inhale 2 puffs into the lungs in the morning and at bedtime. 07/18/24   Kasa, Kurian, MD   budesonide -formoterol  (SYMBICORT ) 80-4.5 MCG/ACT inhaler Inhale 2 puffs into the lungs daily as needed (use if 160-4.5 is too strong /Asthma). 07/18/24   Kasa, Kurian, MD  busPIRone  (BUSPAR ) 5 MG tablet Take 1 tablet (5 mg total) by mouth daily. 02/27/24   Georgina Speaks, FNP  Cholecalciferol (VITAMIN D-3) 125 MCG (5000 UT) TABS Take 15,000 Units by mouth daily.    [provider]  clobetasol  ointment (TEMOVATE ) 0.05 % Apply 1 Application topically 2 (two) times daily. 07/05/23   Georgina Speaks, FNP  CLOBETASOL  PROPIONATE E 0.05 % emollient cream Apply topically 2 (two) times daily as needed.    [provider]  Coenzyme Q10 100 MG capsule Take 100 mg by mouth daily. 02/06/17   [provider]  cyclobenzaprine (FLEXERIL) 10 MG tablet Take 10 mg by mouth at bedtime. 01/06/17   [provider]  dexAMETHasone  Sodium Phosphate  4 MG/ML SOSY Injection 01/06/17   [provider]  diphenhydrAMINE  (BENADRYL ) 50 MG/ML injection Inject 50 mg into the muscle. 01/06/17   [provider]  EPINEPHrine  0.3 mg/0.3 mL IJ SOAJ injection Inject 0.3 mg into the muscle as needed for anaphylaxis. 10/05/22   Georgina Speaks, FNP  Fe Cbn-Fe Gluc-FA-B12-C-DSS (FERRALET  90) 90-1 MG TABS Take 1 tablet by mouth. 11/28/22   [provider]  ferrous sulfate 325 (65 FE) MG tablet Take 325 mg by mouth daily with breakfast. 02/06/17   [provider]  Fluocin-Hydroquinone-Tretinoin (TRI-LUMA) 0.01-4-0.05 % CREA Apply topically. 02/06/17   [provider]  fluticasone  (FLONASE ) 50 MCG/ACT nasal spray Place 2 sprays into both nostrils 2 (two) times daily. 10/05/22   Georgina Speaks, FNP  folic acid (FOLVITE) 1 MG tablet Take 1 mg by mouth daily. 02/06/17   [provider]  hydrocortisone 2.5 % cream Apply 1 Application topically 2 (two) times daily.    [provider]  hydroxychloroquine  (PLAQUENIL ) 200 MG tablet Take 200 mg by mouth 2 (two) times  daily.    [provider]  ipratropium (ATROVENT ) 0.02 % nebulizer solution Take 0.5 mg by nebulization every 4 (four) hours as needed for wheezing or shortness of breath (or  cough). 01/06/17   [provider]  Iron-FA-B Cmp-C-Biot-Probiotic (FUSION PLUS) CAPS TAKE 1 CAPSULE BY MOUTH DAILY 07/22/24   Jarold Medici, MD  Lecithin 1200 MG CAPS Take by mouth. 02/06/17   [provider]  levocetirizine (XYZAL ) 5 MG tablet Take 1 tablet (5 mg total) by mouth every evening. 09/13/19   Kasa, Kurian, MD  loratadine (CLARITIN) 10 MG tablet Take by mouth daily as needed for allergies, rhinitis or itching. 02/06/17   [provider]  MAGNESIUM  PO Take by mouth. 02/06/17   [provider]  Melatonin 10 MG TABS Take by mouth. 02/06/17   [provider]  MILK THISTLE EXTRACT PO Take by mouth. 02/06/17   [provider]  MINOXIDIL, TOPICAL, (ROGAINE EXTRA STRENGTH) 5 % SOLN Apply topically. 06/17/21   [provider]  Misc Natural Products (TURMERIC, CURCUMIN, PO) Take by mouth. 02/06/17   [provider]  montelukast  (SINGULAIR ) 10 MG tablet TAKE 1 TABLET BY MOUTH AT  BEDTIME 04/25/24   Moore, Janece, FNP  Multiple Vitamin (MULTIVITAMIN ADULT PO) Take 1 tablet by mouth daily at 6 (six) AM. [employer]    [provider]  naproxen (NAPROSYN) 500 MG tablet Take 500 mg by mouth 2 (two) times daily with a meal. 01/06/17   [provider]  nystatin (MYCOSTATIN/NYSTOP) powder Apply 1 Application topically. 02/06/17   [provider]  ondansetron  (ZOFRAN  ODT) 4 MG disintegrating tablet Take 1 tablet (4 mg total) by mouth every 8 (eight) hours as needed. 12/10/18   Arlander Charleston, MD  pantoprazole  (PROTONIX ) 40 MG tablet Take 1 tablet (40 mg total) by mouth 2 (two) times daily. 09/04/23   Moore, Janece, FNP  Polyethyl Glycol-Propyl Glycol (SYSTANE OP) Place 2 drops into both ears daily as needed (Cronic Dry eye).     [provider]  potassium gluconate (RA POTASSIUM GLUCONATE) 595 (99 K) MG TABS tablet Take 595 mg by mouth daily. 02/06/17   [provider]  predniSONE  (DELTASONE ) 10 MG tablet Take 1 tablet (10 mg total) by mouth daily with breakfast. 7 days 08/02/24   Kasa, Kurian, MD  Red Yeast Rice Extract 600 MG CAPS Take by mouth. 02/06/17   [provider]  traMADol (ULTRAM) 50 MG tablet Take 50 mg by mouth every 6 (six) hours as needed for moderate pain (pain score 4-6). 01/06/17   [provider]  triamcinolone ointment (KENALOG) 0.1 % Apply 1 Application topically 2 (two) times daily as needed (dermatitis/ Eczema). 03/22/22   [provider]    Physical Exam Vitals: There were no vitals taken for this visit.  General: NAD HEENT: normocephalic, anicteric Thyroid : no enlargement, no palpable nodules Pulmonary: No increased work of breathing, CTAB Cardiovascular: RRR, distal pulses 2+ Breast: Breast symmetrical, no tenderness, no palpable nodules or masses, no skin or nipple retraction present, no nipple discharge.  No axillary or supraclavicular lymphadenopathy. Abdomen: NABS, soft, non-tender, non-distended.  Umbilicus without lesions.  No hepatomegaly, splenomegaly or masses palpable. No evidence of hernia  Genitourinary:  External: Normal external female genitalia.  Normal urethral meatus, normal Bartholin's and Skene's glands.    Vagina: Normal vaginal mucosa, no evidence of prolapse.    Cervix: Grossly normal in appearance, no bleeding  Uterus: enlarged, 16wk sized, mobile, normal contour.  No CMT  Adnexa: ovaries non-enlarged, no adnexal masses  Rectal: deferred  Lymphatic: no evidence of inguinal lymphadenopathy Extremities: no edema, erythema, or tenderness Neurologic: Grossly intact Psychiatric: mood appropriate, affect full  Assessment: 48 y.o. G0P0000 routine annual exam  Plan: Problem List Items Addressed This Visit        Genitourinary   Abnormal uterine bleeding - Primary   Relevant Orders   Ambulatory referral to Obstetrics / Gynecology   Other Visit Diagnoses       Well woman exam         Fibroids       Relevant Orders   Ambulatory referral to Obstetrics / Gynecology       1) Mammogram - recommend yearly screening mammogram.  Mammogram ordered by PCP  2) STI screening  wasoffered and declined  3) ASCCP guidelines and rational discussed.  Patient opts for every 3 years screening interval  4) Osteoporosis  - per USPTF routine screening DEXA at age 27   Consider FDA-approved medical therapies in postmenopausal women and men aged 66 years and older, based on the following: a) A hip or vertebral (clinical or morphometric) fracture b) T-score <= -2.5 at the femoral neck or spine after appropriate evaluation to exclude secondary causes C) Low bone mass (T-score between -1.0 and -2.5 at the femoral neck or spine) and a 10-year probability of a hip fracture >= 3% or a 10-year probability of a major osteoporosis-related fracture >= 20% based on the [place] -adapted WHO algorithm   5) Routine healthcare maintenance including cholesterol, diabetes screening discussed managed by PCP  6) Colonoscopy completed in 2023.  Screening recommended starting at age 7 for average risk individuals, age 42 for individuals deemed at increased risk (including African Americans) and recommended to continue until age 5.  For patient age 59-85 individualized approach is recommended.  Gold standard screening is via colonoscopy, Cologuard screening is an acceptable alternative for patient unwilling or unable to undergo colonoscopy.  Colorectal cancer screening for average?risk adults: 2018 guideline update from the American Cancer SocietyCA: A Cancer Journal for Clinicians: Mar 08, 2017   7) Menopausal resources placed in AVS, please return if you would like to discuss HRT    Jinnie Cookey, CNM  [facility] 09/02/2024,  5:31 PM

## 2024-09-23 ENCOUNTER — Ambulatory Visit

## 2024-09-23 VITALS — BP 120/75 | HR 84 | Temp 97.5°F | Resp 16 | Ht 66.0 in | Wt 287.0 lb

## 2024-09-23 DIAGNOSIS — Z111 Encounter for screening for respiratory tuberculosis: Secondary | ICD-10-CM

## 2024-09-23 DIAGNOSIS — L509 Urticaria, unspecified: Secondary | ICD-10-CM

## 2024-09-23 DIAGNOSIS — B999 Unspecified infectious disease: Secondary | ICD-10-CM

## 2024-09-23 DIAGNOSIS — Z1159 Encounter for screening for other viral diseases: Secondary | ICD-10-CM

## 2024-09-23 DIAGNOSIS — L408 Other psoriasis: Secondary | ICD-10-CM

## 2024-09-23 DIAGNOSIS — R7689 Other specified abnormal immunological findings in serum: Secondary | ICD-10-CM

## 2024-09-23 DIAGNOSIS — L309 Dermatitis, unspecified: Secondary | ICD-10-CM

## 2024-09-23 DIAGNOSIS — M2550 Pain in unspecified joint: Secondary | ICD-10-CM

## 2024-09-23 DIAGNOSIS — L819 Disorder of pigmentation, unspecified: Secondary | ICD-10-CM

## 2024-09-23 DIAGNOSIS — M2480 Other specific joint derangements of unspecified joint, not elsewhere classified: Secondary | ICD-10-CM

## 2024-09-23 DIAGNOSIS — Z79899 Other long term (current) drug therapy: Secondary | ICD-10-CM

## 2024-09-23 DIAGNOSIS — L209 Atopic dermatitis, unspecified: Secondary | ICD-10-CM

## 2024-09-23 DIAGNOSIS — E079 Disorder of thyroid, unspecified: Secondary | ICD-10-CM

## 2024-09-23 MED ORDER — HYDROXYCHLOROQUINE SULFATE 200 MG PO TABS: 200.0000 mg | ORAL_TABLET | Freq: Two times a day (BID) | ORAL | 0 refills | Status: DC

## 2024-09-23 MED ORDER — GABAPENTIN 100 MG PO CAPS: 100.0000 mg | ORAL_CAPSULE | Freq: Every evening | ORAL | 0 refills | Status: AC

## 2024-09-23 NOTE — Progress Notes (Unsigned)
 Office Visit Note  Patient: Jasmine Schultz             Date of Birth: April 03, 1976           MRN: [id number]             PCP: Georgina Speaks, FNP Referring: Georgina Speaks, FNP Visit Date: 09/23/2024 Occupation: Data Unavailable  Subjective:  No chief complaint on file.   History of Present Illness: Jasmine Schultz is a 48 y.o. female ***     Activities of Daily Living:  Patient reports morning stiffness for *** {minute/hour:19697}.   Patient {ACTIONS;DENIES/REPORTS:21021675::Denies} nocturnal pain.  Difficulty dressing/grooming: {ACTIONS;DENIES/REPORTS:21021675::Denies} Difficulty climbing stairs: {ACTIONS;DENIES/REPORTS:21021675::Denies} Difficulty getting out of chair: {ACTIONS;DENIES/REPORTS:21021675::Denies} Difficulty using hands for taps, buttons, cutlery, and/or writing: {ACTIONS;DENIES/REPORTS:21021675::Denies}  No Rheumatology ROS completed.   PMFS History:  Patient Active Problem List   Diagnosis Date Noted   Need for pneumococcal 20-valent conjugate vaccination 07/08/2024   Systemic lupus erythematosus (HCC) 07/08/2024   Obesity, unspecified 07/08/2024   Encounter for annual health examination 07/25/2023   Mixed hyperlipidemia 07/25/2023   Need for influenza vaccination 07/25/2023   Eczema 07/25/2023   Other long term (current) drug therapy 07/25/2023   Abnormal uterine bleeding 02/06/2023   Alopecia 12/29/2022   Encntr for general adult medical exam w/o abnormal findings 12/29/2022   Morbid (severe) obesity due to excess calories (HCC) 12/29/2022   Rash and other nonspecific skin eruption 12/29/2022   Arthritis 12/29/2022   Vaginitis 12/29/2022   Stress 12/29/2022   Abnormal menses 12/29/2022   Sinobronchitis 08/03/2022   Mass of right wrist 07/07/2021   OSA on CPAP 01/10/2019   Elevated platelet count 12/19/2018   Severe obstructive sleep apnea 11/12/2018   Nocturnal hypoxia 11/12/2018   Snoring 10/18/2018   Abnormal glucose 10/18/2018    Chronic GERD 07/11/2018   History of food allergy 07/09/2018   Asthma 06/27/2018   Allergic reaction 06/27/2018   Bilateral hearing loss 02/06/2017   Globus pharyngeus 02/06/2017   Gastroesophageal reflux disease without esophagitis 02/06/2017   Anemia, iron deficiency 06/06/2015   Reactive airway disease 06/05/2015   Dyspnea 06/05/2015   Nausea and vomiting 06/05/2015   Anxiety 06/05/2015   Perennial allergic rhinitis 06/05/2015    Past Medical History:  Diagnosis Date   Anxiety attack    Arthritis    Asthma    diagnosed in march 2019   Bursitis    Chronically dry eyes    GERD (gastroesophageal reflux disease)    IBS (irritable bowel syndrome)    Lupus    Migraine    Multiple allergies    Sun allergy    Tendonitis     Family History  Problem Relation Age of Onset   Asthma Mother    Hypertension Mother    CAD Other    CVA Other    Aneurysm Other        brain aneurysm multiple family members   Breast cancer Other        grandmother   Sickle cell anemia Other        maternal cousin   Aneurysm Maternal Aunt    Diabetes Maternal Aunt    Diabetes Maternal Uncle    Diabetes Paternal Aunt    Heart disease Paternal Uncle    Diabetes Paternal Uncle    Breast cancer Maternal Grandmother    Diabetes Maternal Grandmother    Heart disease Maternal Grandfather    Aneurysm Maternal Grandfather    Diabetes Maternal Grandfather  Diabetes Paternal Grandmother    Heart disease Paternal Grandfather    Diabetes Paternal Grandfather    Past Surgical History:  Procedure Laterality Date   BREAST REDUCTION SURGERY     COLONOSCOPY WITH PROPOFOL  N/A 04/15/2022   Procedure: COLONOSCOPY WITH PROPOFOL ;  Surgeon: Rollin Dover, MD;  Location: [facility];  Service: Gastroenterology;  Laterality: N/A;   POLYPECTOMY  04/15/2022   Procedure: POLYPECTOMY;  Surgeon: Rollin Dover, MD;  Location: [facility];  Service: Gastroenterology;;   REDUCTION MAMMAPLASTY Bilateral    2002   TUBAL  LIGATION     Social History[1] Social History   Social History Narrative   Lives home with husband, Oneil.  Education college.  No children.       Immunization History  Administered Date(s) Administered   Influenza, Seasonal, Injecte, Preservative Fre 07/05/2023, 07/08/2024   Influenza,inj,Quad PF,6+ Mos 08/21/2018, 10/05/2022   Influenza-Unspecified 08/16/2021   PNEUMOCOCCAL CONJUGATE-20 07/08/2024   Tdap 12/10/2010, 06/23/2021     Objective: Vital Signs: Wt 287 lb (130.2 kg)   LMP 08/08/2024 (Approximate)   BMI 46.32 kg/m    Physical Exam   Musculoskeletal Exam: ***  CDAI Exam: CDAI Score: -- Patient Global: --; Provider Global: -- Swollen: --; Tender: -- Joint Exam 09/23/2024   No joint exam has been documented for this visit   There is currently no information documented on the homunculus. Go to the Rheumatology activity and complete the homunculus joint exam.  Investigation: No additional findings.  Imaging: No results found.  Recent Labs: Lab Results  Component Value Date   WBC 3.9 07/08/2024   HGB 10.3 (L) 07/08/2024   PLT 467 (H) 07/08/2024   NA 141 07/08/2024   K 5.2 07/08/2024   CL 108 (H) 07/08/2024   CO2 21 07/08/2024   GLUCOSE 76 07/08/2024   BUN 10 07/08/2024   CREATININE 0.80 07/08/2024   BILITOT 0.3 07/08/2024   ALKPHOS 61 07/08/2024   AST 16 07/08/2024   ALT 9 07/08/2024   PROT 6.5 07/08/2024   ALBUMIN 4.1 07/08/2024   CALCIUM 9.6 07/08/2024   GFRAA 86 06/25/2020    Speciality Comments: No specialty comments available.  Procedures:  No procedures performed Allergies: Chocolate, Cocoa, Peanut butter flavoring agent (non-screening), Seasonal ic [octacosanol], Tomato, Bacitracin-polymyxin b, Other, Oysters [shellfish allergy], Sulfa antibiotics, Balsam, Iodopropynyl, and Quaternium-15   Assessment / Plan:     Visit Diagnoses: No diagnosis found.  Orders: No orders of the defined types were placed in this encounter.  No  orders of the defined types were placed in this encounter.      Face-to-face time spent with patient was *** minutes. Greater than 50% of time was spent in counseling and coordination of care.  Follow-Up Instructions: No follow-ups on file.   Alfonso Patterson, LPN  Note - This record has been created using [employer].  Chart creation errors have been sought, but may not always  have been located. Such creation errors do not reflect on  the standard of medical care.     [1]  Social History Tobacco Use   Smoking status: Never   Smokeless tobacco: Never  Vaping Use   Vaping status: Never Used  Substance Use Topics   Alcohol  use: Yes    Alcohol /week: 0.0 standard drinks of alcohol     Comment: rarley   Drug use: No

## 2024-09-23 NOTE — Progress Notes (Unsigned)
 Office Visit Note  Patient: Jasmine Schultz             Date of Birth: 11-28-1975           MRN: [id number]             PCP: Georgina Speaks, FNP Referring: Georgina Speaks, FNP Visit Date: 09/23/2024 Occupation: Data Unavailable  Subjective:  No chief complaint on file.   History of Present Illness: Jasmine Schultz is a 48 y.o. female ***     Activities of Daily Living:  Patient reports morning stiffness for *** {minute/hour:19697}.   Patient {ACTIONS;DENIES/REPORTS:21021675::Denies} nocturnal pain.  Difficulty dressing/grooming: {ACTIONS;DENIES/REPORTS:21021675::Denies} Difficulty climbing stairs: {ACTIONS;DENIES/REPORTS:21021675::Denies} Difficulty getting out of chair: {ACTIONS;DENIES/REPORTS:21021675::Denies} Difficulty using hands for taps, buttons, cutlery, and/or writing: {ACTIONS;DENIES/REPORTS:21021675::Denies}  Review of Systems  Constitutional:  Positive for fatigue.  HENT:  Positive for mouth sores and mouth dryness.   Eyes:  Positive for dryness.  Respiratory:  Positive for shortness of breath.   Cardiovascular:  Positive for chest pain and palpitations.  Gastrointestinal:  Positive for constipation and diarrhea. Negative for blood in stool.  Endocrine: Positive for increased urination.  Genitourinary:  Negative for involuntary urination.  Musculoskeletal:  Positive for joint pain, gait problem, joint pain, joint swelling, myalgias, muscle weakness, morning stiffness, muscle tenderness and myalgias.  Skin:  Positive for color change, rash, hair loss and sensitivity to sunlight.  Allergic/Immunologic: Positive for susceptible to infections.  Neurological:  Positive for dizziness and headaches.  Hematological:  Positive for swollen glands.  Psychiatric/Behavioral:  Positive for depressed mood and sleep disturbance. The patient is nervous/anxious.     PMFS History:  Patient Active Problem List   Diagnosis Date Noted   Need for pneumococcal 20-valent  conjugate vaccination 07/08/2024   Systemic lupus erythematosus (HCC) 07/08/2024   Obesity, unspecified 07/08/2024   Encounter for annual health examination 07/25/2023   Mixed hyperlipidemia 07/25/2023   Need for influenza vaccination 07/25/2023   Eczema 07/25/2023   Other long term (current) drug therapy 07/25/2023   Abnormal uterine bleeding 02/06/2023   Alopecia 12/29/2022   Encntr for general adult medical exam w/o abnormal findings 12/29/2022   Morbid (severe) obesity due to excess calories (HCC) 12/29/2022   Rash and other nonspecific skin eruption 12/29/2022   Arthritis 12/29/2022   Vaginitis 12/29/2022   Stress 12/29/2022   Abnormal menses 12/29/2022   Sinobronchitis 08/03/2022   Mass of right wrist 07/07/2021   OSA on CPAP 01/10/2019   Elevated platelet count 12/19/2018   Severe obstructive sleep apnea 11/12/2018   Nocturnal hypoxia 11/12/2018   Snoring 10/18/2018   Abnormal glucose 10/18/2018   Chronic GERD 07/11/2018   History of food allergy 07/09/2018   Asthma 06/27/2018   Allergic reaction 06/27/2018   Bilateral hearing loss 02/06/2017   Globus pharyngeus 02/06/2017   Gastroesophageal reflux disease without esophagitis 02/06/2017   Anemia, iron deficiency 06/06/2015   Reactive airway disease 06/05/2015   Dyspnea 06/05/2015   Nausea and vomiting 06/05/2015   Anxiety 06/05/2015   Perennial allergic rhinitis 06/05/2015    Past Medical History:  Diagnosis Date   Anxiety attack    Arthritis    Asthma    diagnosed in march 2019   Bursitis    Chronically dry eyes    GERD (gastroesophageal reflux disease)    IBS (irritable bowel syndrome)    Lupus    Migraine    Multiple allergies    Sun allergy    Tendonitis     Family History  Problem Relation Age of Onset   Asthma Mother    Hypertension Mother    CAD Other    CVA Other    Aneurysm Other        brain aneurysm multiple family members   Breast cancer Other        grandmother   Sickle cell anemia  Other        maternal cousin   Aneurysm Maternal Aunt    Diabetes Maternal Aunt    Diabetes Maternal Uncle    Diabetes Paternal Aunt    Heart disease Paternal Uncle    Diabetes Paternal Uncle    Breast cancer Maternal Grandmother    Diabetes Maternal Grandmother    Heart disease Maternal Grandfather    Aneurysm Maternal Grandfather    Diabetes Maternal Grandfather    Diabetes Paternal Grandmother    Heart disease Paternal Grandfather    Diabetes Paternal Grandfather    Past Surgical History:  Procedure Laterality Date   BREAST REDUCTION SURGERY     COLONOSCOPY WITH PROPOFOL  N/A 04/15/2022   Procedure: COLONOSCOPY WITH PROPOFOL ;  Surgeon: Rollin Dover, MD;  Location: [facility];  Service: Gastroenterology;  Laterality: N/A;   POLYPECTOMY  04/15/2022   Procedure: POLYPECTOMY;  Surgeon: Rollin Dover, MD;  Location: [facility];  Service: Gastroenterology;;   REDUCTION MAMMAPLASTY Bilateral    2002   TUBAL LIGATION     Social History[1] Social History   Social History Narrative   Lives home with husband, Oneil.  Education college.  No children.       Immunization History  Administered Date(s) Administered   Influenza, Seasonal, Injecte, Preservative Fre 07/05/2023, 07/08/2024   Influenza,inj,Quad PF,6+ Mos 08/21/2018, 10/05/2022   Influenza-Unspecified 08/16/2021   PNEUMOCOCCAL CONJUGATE-20 07/08/2024   Tdap 12/10/2010, 06/23/2021     Objective: Vital Signs: Wt 287 lb (130.2 kg)   LMP 08/08/2024 (Approximate)   BMI 46.32 kg/m    Physical Exam   Musculoskeletal Exam: ***  CDAI Exam: CDAI Score: -- Patient Global: --; Provider Global: -- Swollen: --; Tender: -- Joint Exam 09/23/2024   No joint exam has been documented for this visit   There is currently no information documented on the homunculus. Go to the Rheumatology activity and complete the homunculus joint exam.  Investigation: No additional findings.  Imaging: No results found.  Recent  Labs: Lab Results  Component Value Date   WBC 3.9 07/08/2024   HGB 10.3 (L) 07/08/2024   PLT 467 (H) 07/08/2024   NA 141 07/08/2024   K 5.2 07/08/2024   CL 108 (H) 07/08/2024   CO2 21 07/08/2024   GLUCOSE 76 07/08/2024   BUN 10 07/08/2024   CREATININE 0.80 07/08/2024   BILITOT 0.3 07/08/2024   ALKPHOS 61 07/08/2024   AST 16 07/08/2024   ALT 9 07/08/2024   PROT 6.5 07/08/2024   ALBUMIN 4.1 07/08/2024   CALCIUM 9.6 07/08/2024   GFRAA 86 06/25/2020    Speciality Comments: No specialty comments available.  Procedures:  No procedures performed Allergies: Chocolate, Cocoa, Peanut butter flavoring agent (non-screening), Seasonal ic [octacosanol], Tomato, Bacitracin-polymyxin b, Other, Oysters [shellfish allergy], Sulfa antibiotics, Balsam, Iodopropynyl, and Quaternium-15   Assessment / Plan:     Visit Diagnoses: No diagnosis found.  Orders: No orders of the defined types were placed in this encounter.  No orders of the defined types were placed in this encounter.   Face-to-face time spent with patient was *** minutes. Greater than 50% of time was spent in counseling and coordination  of care.  Follow-Up Instructions: No follow-ups on file.   Kierra M VanEaton, CMA  Note - This record has been created using [employer].  Chart creation errors have been sought, but may not always  have been located. Such creation errors do not reflect on  the standard of medical care.      [1]  Social History Tobacco Use   Smoking status: Never   Smokeless tobacco: Never  Vaping Use   Vaping status: Never Used  Substance Use Topics   Alcohol  use: Yes    Alcohol /week: 0.0 standard drinks of alcohol     Comment: rarley   Drug use: No

## 2024-09-26 LAB — HEPATITIS B SURFACE ANTIGEN: Hepatitis B Surface Ag: NONREACTIVE

## 2024-09-26 LAB — ANGIOTENSIN CONVERTING ENZYME: Angiotensin-Converting Enzyme: 16 U/L (ref 9–67)

## 2024-09-26 LAB — PROTEIN / CREATININE RATIO, URINE
Creatinine, Urine: 102 mg/dL (ref 20–275)
Protein/Creat Ratio: 69 mg/g{creat} (ref 24–184)
Protein/Creatinine Ratio: 0.069 mg/mg{creat} (ref 0.024–0.184)
Total Protein, Urine: 7 mg/dL (ref 5–24)

## 2024-09-26 LAB — CBC WITH DIFFERENTIAL/PLATELET
Absolute Lymphocytes: 1155 {cells}/uL (ref 850–3900)
Absolute Monocytes: 575 {cells}/uL (ref 200–950)
Basophils Absolute: 40 {cells}/uL (ref 0–200)
Basophils Relative: 0.8 %
Eosinophils Absolute: 290 {cells}/uL (ref 15–500)
Eosinophils Relative: 5.8 %
HCT: 34 % — ABNORMAL LOW (ref 35.9–46.0)
Hemoglobin: 10.5 g/dL — ABNORMAL LOW (ref 11.7–15.5)
MCH: 27.4 pg (ref 27.0–33.0)
MCHC: 30.9 g/dL — ABNORMAL LOW (ref 31.6–35.4)
MCV: 88.8 fL (ref 81.4–101.7)
MPV: 8.7 fL (ref 7.5–12.5)
Monocytes Relative: 11.5 %
Neutro Abs: 2940 {cells}/uL (ref 1500–7800)
Neutrophils Relative %: 58.8 %
Platelets: 514 Thousand/uL — ABNORMAL HIGH (ref 140–400)
RBC: 3.83 Million/uL (ref 3.80–5.10)
RDW: 12.4 % (ref 11.0–15.0)
Total Lymphocyte: 23.1 %
WBC: 5 Thousand/uL (ref 3.8–10.8)

## 2024-09-26 LAB — THYROID ANTIBODIES (THYROPEROXIDASE & THYROGLOBULIN)
Thyroglobulin Ab: 4 [IU]/mL — ABNORMAL HIGH (ref <=1)
Thyroperoxidase Ab SerPl-aCnc: 561 [IU]/mL — ABNORMAL HIGH (ref <9)

## 2024-09-26 LAB — QUANTIFERON-TB GOLD PLUS
Mitogen-NIL: 6.75 [IU]/mL
NIL: 0.03 [IU]/mL
QuantiFERON-TB Gold Plus: NEGATIVE
TB1-NIL: 0 [IU]/mL
TB2-NIL: 0.01 [IU]/mL

## 2024-09-26 LAB — THYROID PANEL WITH TSH
Free Thyroxine Index: 1.6 (ref 1.4–3.8)
T3 Uptake: 27 % (ref 22–35)
T4, Total: 6.1 ug/dL (ref 5.1–11.9)
TSH: 1.84 m[IU]/L

## 2024-09-26 LAB — AST: AST: 16 U/L (ref 10–35)

## 2024-09-26 LAB — C-REACTIVE PROTEIN: CRP: <3 mg/L (ref <8.0)

## 2024-09-26 LAB — C3 AND C4
C3 Complement: 149 mg/dL (ref 83–193)
C4 Complement: 36 mg/dL (ref 15–57)

## 2024-09-26 LAB — ANTI-NUCLEAR AB-TITER (ANA TITER): ANA Titer 1: 1:80 {titer} — ABNORMAL HIGH

## 2024-09-26 LAB — GLUCOSE 6 PHOSPHATE DEHYDROGENASE: G-6PDH: 19.7 U/g{Hb} (ref 7.0–20.5)

## 2024-09-26 LAB — HEPATITIS B CORE ANTIBODY, IGM: Hep B C IgM: NONREACTIVE

## 2024-09-26 LAB — ANA: Anti Nuclear Antibody (ANA): POSITIVE — AB

## 2024-09-26 LAB — RNP ANTIBODY: Ribonucleic Protein(ENA) Antibody, IgG: >8 AI — AB

## 2024-09-26 LAB — SJOGREN'S SYNDROME ANTIBODS(SSA + SSB)
SSA (Ro) (ENA) Antibody, IgG: <1 AI
SSB (La) (ENA) Antibody, IgG: <1 AI

## 2024-09-26 LAB — CYCLIC CITRUL PEPTIDE ANTIBODY, IGG: Cyclic Citrullin Peptide Ab: <16 U

## 2024-09-26 LAB — HEPATITIS C ANTIBODY: Hepatitis C Ab: NONREACTIVE

## 2024-09-26 LAB — IGG, IGA, IGM
IgG (Immunoglobin G), Serum: 1163 mg/dL (ref 600–1640)
IgM, Serum: 98 mg/dL (ref 50–300)
Immunoglobulin A: 241 mg/dL (ref 47–310)

## 2024-09-26 LAB — CREATININE, SERUM: Creat: 0.77 mg/dL (ref 0.50–1.03)

## 2024-09-26 LAB — C1 ESTERASE INHIBITOR, FUNCTIONAL: C1 Esterase Inhibitor Funct: >100 % (ref >=68)

## 2024-09-26 LAB — ANTI-DNA ANTIBODY, DOUBLE-STRANDED: ds DNA Ab: <1 [IU]/mL

## 2024-09-26 LAB — TRYPTASE: Tryptase: 3.6 ug/L (ref <11.0)

## 2024-09-26 LAB — ANTI-SMITH ANTIBODY: ENA SM Ab Ser-aCnc: <1 AI

## 2024-09-26 LAB — ALT: ALT: 12 U/L (ref 6–29)

## 2024-09-26 LAB — SEDIMENTATION RATE: Sed Rate: 19 mm/h (ref 0–20)

## 2024-09-26 LAB — HLA-B27 ANTIGEN: HLA-B27 Antigen: NEGATIVE

## 2024-09-26 LAB — RHEUMATOID FACTOR: Rheumatoid fact SerPl-aCnc: <10 [IU]/mL (ref <14)

## 2024-10-08 ENCOUNTER — Ambulatory Visit: Payer: Self-pay

## 2024-10-16 NOTE — Progress Notes (Unsigned)
 "  Office Visit Note  Patient: Jasmine Schultz             Date of Birth: [birth year]           MRN: [id number]             PCP: Georgina Speaks, FNP Referring: Georgina Speaks, FNP Visit Date: 10/21/2024 Occupation: Data Unavailable  Subjective:  No chief complaint on file.  Discussed the use of AI scribe software for clinical note transcription with the patient, who gave verbal consent to proceed.  History of Present Illness Jasmine Schultz is a 49 year old female with lupus and hypermobility who presents for follow-up regarding mixed connective tissue disease and thyroid  issues.  She has a history of lupus with recent negative tests for lupus-specific markers and a positive RNP antibody test. Symptoms include skin rashes, photosensitivity, brain fog, muscle weakness, particularly when climbing stairs, and a sensation of her hips 'falling out of joint.'  She has hypermobility with symptoms such as soft skin, unexplained bruising, and a history of performing splits easily. She reports being told she is on the hypermobility spectrum and does not have Ehlers-Danlos syndrome. She reports being told she has POTS and experiences heart palpitations. She reports ongoing joint pain and hypermobility issues. She experiences significant pain in her hips and knees, constant shifting to prevent joint locking, and difficulty sleeping due to joint pain and stiffness.  She has asthma associated with anxiety and episodes of throat closing, leading to past anaphylaxis. A misdiagnosis of asthma previously led to hospitalization.  She admits to taking HCQ 400mg  daily as directed, no side effects or missed doses. She admits to some improvement with gabapentin , and wishes to increase dose.  She has eczema and atopic dermatitis with persistent skin issues, including rashes and dark spots around her eyes. She describes a condition similar to impetigo that is unresponsive to typical treatments.     Activities of  Daily Living:  Patient reports morning stiffness for 1-2 hours.   Patient Reports nocturnal pain.  Difficulty dressing/grooming: Reports Difficulty climbing stairs: Reports Difficulty getting out of chair: Reports Difficulty using hands for taps, buttons, cutlery, and/or writing: Reports  Review of Systems  Constitutional:  Positive for fatigue.  HENT:  Positive for mouth dryness. Negative for mouth sores.   Eyes:  Positive for dryness.  Respiratory:  Positive for shortness of breath.   Cardiovascular:  Negative for chest pain and palpitations.  Gastrointestinal:  Positive for constipation. Negative for blood in stool and diarrhea.  Endocrine: Negative for increased urination.  Genitourinary:  Positive for involuntary urination.  Musculoskeletal:  Positive for joint pain, gait problem, joint pain, joint swelling, myalgias, muscle weakness, morning stiffness, muscle tenderness and myalgias.  Skin:  Positive for hair loss and sensitivity to sunlight. Negative for color change and rash.  Allergic/Immunologic: Positive for susceptible to infections.  Neurological:  Positive for dizziness. Negative for headaches.  Hematological:  Negative for swollen glands.  Psychiatric/Behavioral:  Positive for depressed mood. Negative for sleep disturbance. The patient is nervous/anxious.     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   Alopecia 12/29/2022   Encntr for general adult  medical exam w/o abnormal findings 12/29/2022   Morbid (severe) obesity due to excess calories (HCC) 12/29/2022   Rash and other nonspecific skin eruption 12/29/2022   Arthritis 12/29/2022   Vaginitis 12/29/2022   Stress 12/29/2022   Abnormal menses 12/29/2022   Sinobronchitis 08/03/2022   Mass of right wrist 07/07/2021   OSA on CPAP 01/10/2019   Elevated platelet count 12/19/2018   Severe obstructive sleep apnea 11/12/2018   Nocturnal hypoxia 11/12/2018   Snoring 10/18/2018   Abnormal glucose 10/18/2018   Chronic GERD 07/11/2018   History of food allergy 07/09/2018   Asthma 06/27/2018   Allergic reaction 06/27/2018   Bilateral hearing loss 02/06/2017   Globus pharyngeus 02/06/2017   Gastroesophageal reflux disease without esophagitis 02/06/2017   Anemia, iron deficiency 06/06/2015   Reactive airway disease 06/05/2015   Dyspnea 06/05/2015   Nausea and vomiting 06/05/2015   Anxiety 06/05/2015   Perennial allergic rhinitis 06/05/2015    Past Medical History:  Diagnosis Date   Anxiety attack    Arthritis    Asthma    diagnosed in march 2019   Bursitis    Chronically dry eyes    Connective tissue disorder 2023   Fibromyalgia 2023   GERD (gastroesophageal reflux disease)    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 IBS, PVC, acid reflux   Carpal tunnel syndrome Mother    Arthritis Mother    Diabetes Father    Stroke Father    Breast cancer Maternal Grandmother    Diabetes Maternal Grandmother    Heart disease Maternal Grandfather    Aneurysm Maternal Grandfather    Diabetes Maternal Grandfather    Diabetes Paternal Grandmother    Heart disease Paternal Grandfather    Diabetes Paternal Grandfather    Aneurysm Maternal Aunt    Diabetes  Maternal Aunt    Diabetes Maternal Uncle    Diabetes Paternal Aunt    Heart disease Paternal Uncle    Diabetes Paternal Uncle    CAD Other    CVA Other    Aneurysm Other        brain aneurysm multiple family members   Breast cancer Other        grandmother   Sickle cell anemia Other        maternal cousin   Past Surgical History:  Procedure Laterality Date   BREAST REDUCTION SURGERY     COLONOSCOPY WITH PROPOFOL  N/A 04/15/2022   Procedure: COLONOSCOPY WITH PROPOFOL ;  Surgeon: Rollin Dover, MD;  Location: [facility];  Service: Gastroenterology;  Laterality: N/A;   POLYPECTOMY  04/15/2022   Procedure: POLYPECTOMY;  Surgeon: Rollin Dover, MD;  Location: [facility];  Service: Gastroenterology;;   REDUCTION MAMMAPLASTY Bilateral    2002   TUBAL LIGATION     Social History[1] Social History   Social History Narrative   Lives home with husband, Oneil.  Education college.  No children.       Immunization History  Administered Date(s) Administered   Influenza, Seasonal, Injecte, Preservative Fre 07/05/2023, 07/08/2024  Influenza,inj,Quad PF,6+ Mos 08/21/2018, 10/05/2022   Influenza-Unspecified 08/16/2021   PNEUMOCOCCAL CONJUGATE-20 07/08/2024   Tdap 12/10/2010, 06/23/2021     Objective: Vital Signs: BP 116/80   Pulse 79   Temp 97.7 F (36.5 C)   Resp 16   Ht 5' 6 (1.676 m)   Wt 292 lb 12.8 oz (132.8 kg)   LMP 09/14/2024 (Approximate)   BMI 47.26 kg/m    Physical Exam   Musculoskeletal Exam:   CDAI Exam: CDAI Score: -- Patient Global: --; Provider Global: -- Swollen: --; Tender: -- Joint Exam 10/21/2024   No joint exam has been documented for this visit   There is currently no information documented on the homunculus. Go to the Rheumatology activity and complete the homunculus joint exam.  Investigation: No additional findings.  Imaging: No results found.  Recent Labs: Lab Results  Component Value Date   WBC 5.0 09/23/2024   HGB 10.5 (L)  09/23/2024   PLT 514 (H) 09/23/2024   NA 141 07/08/2024   K 5.2 07/08/2024   CL 108 (H) 07/08/2024   CO2 21 07/08/2024   GLUCOSE 76 07/08/2024   BUN 10 07/08/2024   CREATININE 0.77 09/23/2024   BILITOT 0.3 07/08/2024   ALKPHOS 61 07/08/2024   AST 16 09/23/2024   ALT 12 09/23/2024   PROT 6.5 07/08/2024   ALBUMIN 4.1 07/08/2024   CALCIUM 9.6 07/08/2024   GFRAA 86 06/25/2020   QFTBGOLDPLUS NEGATIVE 09/23/2024    Speciality Comments: No specialty comments available.  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 Given patient reports myalgias, will check CK and aldolase today to evaluate for any myositis which can be seen in MCTD. Will also check scleroderma abs given she does admit to hx of raynaud's. She does report improvement in her symptoms on HCQ 400mg  daily, will continue at this time.   As discussed with patient, suspect majority of her continued symptoms are secondary to co-existing conditions discussed below; patient verbalizes understanding.   Fibromyalgia  Patient with hx of fibromyalgia. Fibromyalgia is a chronic pain disorder, thought to be a disorder of pain regulation, and often classified as a form of central sensitization. Fibromyalgia is characterized by widespread musculoskeletal pain, accompanied by fatigue, cognitive disturbances, psychiatric symptoms, headaches, paresthesia's, and multiple somatic symptoms. Fibromyalgia is not an inflammatory or autoimmune condition, and  immunosuppressive therapy does not treat fibromyalgia. Treatment is both nonpharmacological and pharmacological in nature, and should focus on sleep hygiene, graded exercise programs, pharmacologic therapy (options including TCA (amitriptyline), SNRIs (duloxetine, milnacipran), flexeril, gabapentin /lyrica, naltrexone, TENS), pyschosocial therapy i.e. CBT. The patient will likely benefit from multidisciplinary care by PCP, PT, integrative medicine, psychiatry, and pain management. These recommendations were discussed with the patient.  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 Recommend continued follow-up with sports medicine.  Chronic dermatologic disease Atopic dermatitis? Urticaria Eczema Psoriasis Patient with hx and photos of cutaneous manifestations, which include what appear to be urticarial lesions. Referred to dermatology for further evaluation and management placed at last encounter.  Abnormal  thyroid  testing Elevated thyroid  peroxidase antibodies on recent lab work, higher than on previous studies. Will keep a close eye on thyroid  tests, low threshold to send to endocrinology.   High risk medication use On HCQ; monitor CBC, Cr, AST, ALT q6 months, next due 03/2024.  Orders: Orders Placed This Encounter  Procedures   CK   Aldolase   Systemic Sclerosis (Scleroderma) 12 Antibodies Panel 2   Meds ordered this encounter  Medications   gabapentin  (NEURONTIN ) 300 MG capsule    Sig: Take 1 capsule (300 mg total) by mouth 3 (three) times daily.    Dispense:  90 capsule    Refill:  2   hydroxychloroquine  (PLAQUENIL ) 200 MG tablet    Sig: Take 1 tablet (200 mg total) by mouth 2 (two) times daily.    Dispense:  180 tablet    Refill:  1    I personally spent a total of 40 minutes in the care of the patient today including preparing to see the patient,  getting/reviewing separately obtained history, performing a medically appropriate exam/evaluation, counseling and educating, placing orders, documenting clinical information in the EHR, independently interpreting results, and communicating results.   Follow-Up Instructions: Return in about 6 months (around 04/20/2025).   Asberry Claw, DO  Note - This record has been created using Animal nutritionist.  Chart creation errors have been sought, but may not always  have been located. Such creation errors do not reflect on  the standard of medical care.     [1]  Social History Tobacco Use   Smoking status: Never    Passive exposure: Past   Smokeless tobacco: Never  Vaping Use   Vaping status: Never Used  Substance Use Topics   Alcohol  use: Yes    Alcohol /week: 0.0 standard drinks of alcohol     Comment: rarley   Drug use: No   "

## 2024-10-18 NOTE — Progress Notes (Unsigned)
"       ° °  LILLETTE Ileana Collet, PhD, LAT, ATC acting as a scribe for Artist Lloyd, MD.  Jasmine Schultz is a 49 y.o. female who presents to [facility] today for evaluation of her hypermobility. Dx w/ lupus, fibromyalgia, and a connective tissue disorder in 2018.  MS: Chronic joint pain, Chronic wide spread muscle pain, and Joint Hypermobility Skin/Immune reactions: MCAS, Rashes / Hives, and Medication / Chemical / Food Sensitivities , iron deficiency Neurological: Sleep Disturbance ANS: Anxiety / Panic Respiratory: Dyspnea and Vocal Chord Dysfunction CV: {EDSCARDIO:33816} GI:  GERD and Frequent N/V Genitourinary: {EDSUROGEN:33818} Hands & Feet: {EDSHANDSFEET:33819}  Treatments tried  Pertinent review of systems: ***  Relevant historical information: ***   Exam:  LMP 09/14/2024 (Approximate)  General: Well Developed, well nourished, and in no acute distress.   MSK: ***    Lab and Radiology Results No results found for this or any previous visit (from the past 72 hours). No results found.     Assessment and Plan: 49 y.o. female with ***   PDMP not reviewed this encounter. No orders of the defined types were placed in this encounter.  No orders of the defined types were placed in this encounter.    Discussed warning signs or symptoms. Please see discharge instructions. Patient expresses understanding.   ***  "

## 2024-10-20 ENCOUNTER — Other Ambulatory Visit: Payer: Self-pay

## 2024-10-21 ENCOUNTER — Ambulatory Visit: Admitting: Family Medicine

## 2024-10-21 ENCOUNTER — Ambulatory Visit

## 2024-10-21 ENCOUNTER — Encounter: Payer: Self-pay | Admitting: Family Medicine

## 2024-10-21 VITALS — BP 116/80 | HR 79 | Temp 97.7°F | Resp 16 | Ht 66.0 in | Wt 292.8 lb

## 2024-10-21 VITALS — BP 120/82 | HR 74 | Ht 66.0 in | Wt 292.0 lb

## 2024-10-21 DIAGNOSIS — I7300 Raynaud's syndrome without gangrene: Secondary | ICD-10-CM | POA: Diagnosis not present

## 2024-10-21 DIAGNOSIS — M351 Other overlap syndromes: Secondary | ICD-10-CM

## 2024-10-21 DIAGNOSIS — M2480 Other specific joint derangements of unspecified joint, not elsewhere classified: Secondary | ICD-10-CM

## 2024-10-21 DIAGNOSIS — R21 Rash and other nonspecific skin eruption: Secondary | ICD-10-CM | POA: Diagnosis not present

## 2024-10-21 DIAGNOSIS — R7689 Other specified abnormal immunological findings in serum: Secondary | ICD-10-CM

## 2024-10-21 DIAGNOSIS — Z79899 Other long term (current) drug therapy: Secondary | ICD-10-CM

## 2024-10-21 DIAGNOSIS — M357 Hypermobility syndrome: Secondary | ICD-10-CM

## 2024-10-21 DIAGNOSIS — L509 Urticaria, unspecified: Secondary | ICD-10-CM

## 2024-10-21 DIAGNOSIS — L309 Dermatitis, unspecified: Secondary | ICD-10-CM | POA: Diagnosis not present

## 2024-10-21 DIAGNOSIS — L408 Other psoriasis: Secondary | ICD-10-CM

## 2024-10-21 DIAGNOSIS — M3213 Lung involvement in systemic lupus erythematosus: Secondary | ICD-10-CM

## 2024-10-21 HISTORY — DX: Hypermobility syndrome: M35.7

## 2024-10-21 MED ORDER — HYDROXYCHLOROQUINE SULFATE 200 MG PO TABS: 200.0000 mg | ORAL_TABLET | Freq: Two times a day (BID) | ORAL | 1 refills | Status: AC

## 2024-10-21 MED ORDER — GABAPENTIN 300 MG PO CAPS: 300.0000 mg | ORAL_CAPSULE | Freq: Three times a day (TID) | ORAL | 2 refills | Status: AC

## 2024-10-21 MED ORDER — FAMOTIDINE 40 MG PO TABS: 40.0000 mg | ORAL_TABLET | Freq: Two times a day (BID) | ORAL | 1 refills | Status: AC

## 2024-10-21 NOTE — Addendum Note (Signed)
 Addended by: MARDY LEOTIS RAMAN on: 10/21/2024 08:56 AM   Modules accepted: Orders

## 2024-10-21 NOTE — Telephone Encounter (Signed)
 Last Fill: 09/23/2024  Next Visit: 10/21/2024  Last Visit: 09/23/2024  Dx: Fibromyalgia   Current Dose per office note on 09/23/2024: gabapentin  100 mg at night for fibromyalgia and nerve pain.   Okay to refill Gabapentin ?

## 2024-10-21 NOTE — Patient Instructions (Addendum)
 Thank you for coming in today.   RX sent in for Famotidine .   Check out the book Disjointed Navigating the Diagnosis and Management of Hypermobile Ehlers-Danlos Syndrome and Hypermobility Spectrum Disorders.    For POTS symptoms: Consume 3 L water  and 8-12 gram of salt per day   Check out these websites for exercises to try if you have POTS ([employer] Protocol, [place] Protocol, and [employer] Protocol) Https://www.taylor-robbins.com/ https://dean.info/   Guide to Prof. Consuelo Patient Self-Care Handouts https://webspace.Http://www.black-smith.org/  Orthostatic Intolerance and Postural Orthostatic Tachycardia Self-Care Checklist https://webspace.Wvlyxc.com  Steps To Managing Your Mast Cell Activation Syndrome https://webspace.Newyearsms.fi.pdf   A referral for physical therapy has been submitted. A representative from the physical therapy office will contact you to coordinate scheduling after confirming your benefits with your insurance provider. If you do not hear from the physical therapy office within the next 1-2 weeks, please let us  know.   See you back in 1 month

## 2024-10-28 ENCOUNTER — Ambulatory Visit

## 2024-11-25 ENCOUNTER — Ambulatory Visit: Admitting: Family Medicine

## 2025-04-28 ENCOUNTER — Ambulatory Visit

## 2025-05-26 ENCOUNTER — Ambulatory Visit: Admitting: Physician Assistant

## 2025-07-09 ENCOUNTER — Encounter: Payer: Self-pay | Admitting: Nurse Practitioner
